# Patient Record
Sex: Male | Born: 1940 | Race: White | Hispanic: No | Smoking: Former smoker
Health system: Southern US, Community
[De-identification: ages and names within clinical notes are randomized; demographics above are authoritative.]

## PROBLEM LIST (undated history)

## (undated) DIAGNOSIS — Z923 Personal history of irradiation: Secondary | ICD-10-CM

## (undated) DIAGNOSIS — C61 Malignant neoplasm of prostate: Secondary | ICD-10-CM

## (undated) DIAGNOSIS — C7951 Secondary malignant neoplasm of bone: Secondary | ICD-10-CM

## (undated) HISTORY — PX: APPENDECTOMY: SHX54

## (undated) HISTORY — PX: COLON SURGERY: SHX602

## (undated) HISTORY — PX: CHOLECYSTECTOMY: SHX55

## (undated) HISTORY — PX: CATARACT EXTRACTION: SUR2

---

## 2012-09-09 ENCOUNTER — Other Ambulatory Visit: Payer: Self-pay | Admitting: Family Medicine

## 2012-09-09 DIAGNOSIS — M79604 Pain in right leg: Secondary | ICD-10-CM

## 2012-09-09 DIAGNOSIS — N200 Calculus of kidney: Secondary | ICD-10-CM

## 2012-09-09 DIAGNOSIS — R109 Unspecified abdominal pain: Secondary | ICD-10-CM

## 2012-09-09 DIAGNOSIS — R319 Hematuria, unspecified: Secondary | ICD-10-CM

## 2012-09-12 ENCOUNTER — Ambulatory Visit
Admission: RE | Admit: 2012-09-12 | Discharge: 2012-09-12 | Disposition: A | Discharge status: Home / Self Care | Payer: Medicare Other | Source: Ambulatory Visit | Attending: Family Medicine | Admitting: Family Medicine

## 2012-09-12 DIAGNOSIS — M79605 Pain in left leg: Secondary | ICD-10-CM

## 2012-09-12 DIAGNOSIS — N200 Calculus of kidney: Secondary | ICD-10-CM

## 2012-09-12 DIAGNOSIS — R109 Unspecified abdominal pain: Secondary | ICD-10-CM

## 2012-09-12 DIAGNOSIS — R319 Hematuria, unspecified: Secondary | ICD-10-CM

## 2012-09-26 ENCOUNTER — Other Ambulatory Visit (HOSPITAL_COMMUNITY): Payer: Self-pay | Admitting: Urology

## 2012-09-26 DIAGNOSIS — C61 Malignant neoplasm of prostate: Secondary | ICD-10-CM

## 2012-10-02 ENCOUNTER — Encounter (HOSPITAL_COMMUNITY)
Admission: RE | Admit: 2012-10-02 | Discharge: 2012-10-02 | Disposition: A | Discharge status: Home / Self Care | Payer: Medicare Other | Source: Ambulatory Visit | Attending: Urology | Admitting: Urology

## 2012-10-02 ENCOUNTER — Encounter (INDEPENDENT_AMBULATORY_CARE_PROVIDER_SITE_OTHER): Payer: Self-pay | Admitting: Surgery

## 2012-10-02 DIAGNOSIS — M948X9 Other specified disorders of cartilage, unspecified sites: Secondary | ICD-10-CM | POA: Insufficient documentation

## 2012-10-02 DIAGNOSIS — C61 Malignant neoplasm of prostate: Secondary | ICD-10-CM | POA: Insufficient documentation

## 2012-10-02 MED ORDER — TECHNETIUM TC 99M MEDRONATE IV KIT
25.3000 | PACK | Freq: Once | INTRAVENOUS | Status: AC | PRN
  Administered 2012-10-02: 25.3 via INTRAVENOUS

## 2012-10-08 ENCOUNTER — Encounter (INDEPENDENT_AMBULATORY_CARE_PROVIDER_SITE_OTHER): Payer: Self-pay | Admitting: Surgery

## 2012-10-08 ENCOUNTER — Ambulatory Visit (INDEPENDENT_AMBULATORY_CARE_PROVIDER_SITE_OTHER): Payer: Medicare Other | Admitting: Surgery

## 2012-10-08 VITALS — BP 127/88 | HR 78 | Temp 97.7°F | Resp 18 | Ht 72.0 in | Wt 280.8 lb

## 2012-10-08 DIAGNOSIS — K8010 Calculus of gallbladder with chronic cholecystitis without obstruction: Secondary | ICD-10-CM

## 2012-10-08 NOTE — Progress Notes (Signed)
Patient ID: Clarence Pollard, male   DOB: 09-Jan-1941, 72 y.o.   MRN: [id number]  Chief Complaint  Patient presents with  . Abdominal Pain    gallstone    HPI Clarence Pollard is a 72 y.o. male.  Referred by Dr. Jeannetta Nap for evaluation of gallbladder disease Urology - Dr. Brunilda Payor  Abdominal Pain Associated symptoms: nausea   Associated symptoms: no chest pain, no chills, no constipation, no cough, no diarrhea, no fever, no hematuria, no sore throat and no vomiting    This is a 72 year old male who unfortunately lost his wife to liver cancer about a month ago, but over the period of the last several weeks he has been experiencing a lot of upper abdominal pain and distention. This seems to get worse when he eats. Occasionally he has some mild nausea.  He also developed some hematuria and has been having a lot of dysuria. He was found to have some nonobstructing  Kidney stones which may explain hematuria. Incidentally he was also noted to have a large gallstone. Unfortunately patient was also found to have elevated PSA and has been diagnosed with prostate cancer. He has an appointment later this afternoon with Dr. Brunilda Payor to discuss plans for treatment. His main complaint is the upper abdominal distention and pain which radiates to his back.  He is accompanied by his stepdaughter today. History reviewed. No pertinent past medical history.  Past Surgical History  Procedure Laterality Date  . Appendectomy    . Colon surgery      History reviewed. No pertinent family history.  Social History History  Substance Use Topics  . Smoking status: Former Games developer  . Smokeless tobacco: Not on file  . Alcohol Use: No    No Known Allergies  Current Outpatient Prescriptions  Medication Sig Dispense Refill  . doxazosin (CARDURA) 8 MG tablet Take 8 mg by mouth at bedtime.      Marland Kitchen HYDROcodone-acetaminophen (NORCO/VICODIN) 5-325 MG per tablet Take 1 tablet by mouth every 6 (six) hours as needed for pain.       . naproxen (NAPROSYN) 500 MG tablet Take 500 mg by mouth 2 (two) times daily with a meal.      . travoprost, benzalkonium, (TRAVATAN) 0.004 % ophthalmic solution 1 drop at bedtime.       No current facility-administered medications for this visit.    Review of Systems Review of Systems  Constitutional: Negative for fever, chills and unexpected weight change.  HENT: Negative for hearing loss, congestion, sore throat, trouble swallowing and voice change.   Eyes: Negative for visual disturbance.  Respiratory: Negative for cough and wheezing.   Cardiovascular: Negative for chest pain, palpitations and leg swelling.  Gastrointestinal: Positive for nausea, abdominal pain and abdominal distention. Negative for vomiting, diarrhea, constipation, blood in stool, anal bleeding and rectal pain.  Genitourinary: Negative for hematuria and difficulty urinating.  Musculoskeletal: Positive for back pain and arthralgias.  Skin: Negative for rash and wound.  Neurological: Negative for seizures, syncope, weakness and headaches.  Hematological: Negative for adenopathy. Does not bruise/bleed easily.  Psychiatric/Behavioral: Negative for confusion.    Blood pressure 127/88, pulse 78, temperature 97.7 F (36.5 C), temperature source Temporal, resp. rate 18, height 6' (1.829 m), weight 280 lb 12.8 oz (127.37 kg).  Physical Exam Physical Exam WDWN in NAD HEENT:  EOMI, sclera anicteric Neck:  No masses, no thyromegaly Lungs:  CTA bilaterally; normal respiratory effort CV:  Regular rate and rhythm; no murmurs Abd:  +bowel sounds, soft,  obese; tender in epigastrium and RUQ; no palpable masses Ext:  Well-perfused; no edema Skin:  Warm, dry; no sign of jaundice  Data Reviewed *RADIOLOGY REPORT*  Clinical Data: Abdominal pain and hematuria.  CT ABDOMEN AND PELVIS WITHOUT CONTRAST  Technique: Multidetector CT imaging of the abdomen and pelvis was  performed following the standard protocol without  intravenous  contrast.  Comparison: None.  Findings: The lung bases are clear except for dependent  atelectasis. The heart is upper limits of normal in size. The  distal esophagus is unremarkable.  No significant hepatic abnormalities. A right hepatic lobe cyst is  noted. No biliary dilatation. The gallbladder is mildly distended  and there is a calcified gallstone noted. No pericholecystic  inflammation. The common bile duct is normal in caliber. The  pancreas is unremarkable. The spleen is normal in size. No focal  lesions. The adrenal glands are normal. There are bilateral renal  calculi but no obstructing ureteral calculi or hydronephrosis. No  bladder calculi.  The stomach, duodenum, small bowel and colon are grossly normal  without oral contrast. No inflammatory changes or mass lesions.  No obstructive findings. No mesenteric or retroperitoneal mass or  adenopathy. Small scattered lymph nodes are noted. The aorta is  normal in caliber. Scattered atherosclerotic calcifications are  noted.  The bladder, prostate gland and seminal vesicles are unremarkable.  No pelvic mass, adenopathy or free pelvic fluid collections. No  inguinal mass or hernia.  The bony structures are intact. No destructive bone lesions or  spinal canal compromise. Both hips are normally located. Mild  degenerative changes.  IMPRESSION:  1. Bilateral renal calculi likely accounting for the patient's  hematuria. No obstructing ureteral calculi or bladder calculi.  2. Cholelithiasis and mild gallbladder distention. No common bile  duct dilatation.  3. No other significant abdominal/pelvic findings.  Original Report Authenticated By: Rudie Meyer, M.D.    Assessment    Symptomatic cholelithiasis with chronic cholecystitis     Plan    Laparoscopic cholecystectomy with intraoperative cholangiogram. The surgical procedure has been discussed with the patient.  Potential risks, benefits, alternative  treatments, and expected outcomes have been explained.  All of the patient's questions at this time have been answered.  The likelihood of reaching the patient's treatment goal is good.  The patient understand the proposed surgical procedure and wishes to proceed. We will await Dr. Madilyn Hook discussion with the patient regarding his prostate cancer.  If no surgery is needed for his prostate, we will schedule his cholecystectomy.            Kraig Genis K. 10/08/2012, 12:25 PM

## 2012-10-09 ENCOUNTER — Telehealth (INDEPENDENT_AMBULATORY_CARE_PROVIDER_SITE_OTHER): Payer: Self-pay | Admitting: Surgery

## 2012-10-09 ENCOUNTER — Other Ambulatory Visit (HOSPITAL_COMMUNITY): Payer: Self-pay | Admitting: Urology

## 2012-10-09 DIAGNOSIS — R937 Abnormal findings on diagnostic imaging of other parts of musculoskeletal system: Secondary | ICD-10-CM

## 2012-10-09 DIAGNOSIS — C61 Malignant neoplasm of prostate: Secondary | ICD-10-CM

## 2012-10-09 NOTE — Telephone Encounter (Signed)
Patient to call back to schedule, holding face sheet in pending

## 2012-10-10 ENCOUNTER — Telehealth (INDEPENDENT_AMBULATORY_CARE_PROVIDER_SITE_OTHER): Payer: Self-pay | Admitting: General Surgery

## 2012-10-10 NOTE — Telephone Encounter (Signed)
Ask Cecilie Kicks to call patient to get him schedule for surgery

## 2012-10-11 ENCOUNTER — Ambulatory Visit
Admission: RE | Admit: 2012-10-11 | Discharge: 2012-10-11 | Disposition: A | Discharge status: Home / Self Care | Payer: Medicare Other | Source: Ambulatory Visit | Attending: Radiation Oncology | Admitting: Radiation Oncology

## 2012-10-11 ENCOUNTER — Other Ambulatory Visit (HOSPITAL_COMMUNITY): Payer: Self-pay | Admitting: Urology

## 2012-10-11 ENCOUNTER — Encounter: Payer: Self-pay | Admitting: Radiation Oncology

## 2012-10-11 ENCOUNTER — Ambulatory Visit (HOSPITAL_COMMUNITY)
Admission: RE | Admit: 2012-10-11 | Discharge: 2012-10-11 | Disposition: A | Discharge status: Home / Self Care | Payer: Medicare Other | Source: Ambulatory Visit | Attending: Urology | Admitting: Urology

## 2012-10-11 DIAGNOSIS — K208 Other esophagitis without bleeding: Secondary | ICD-10-CM | POA: Insufficient documentation

## 2012-10-11 DIAGNOSIS — Z510 Encounter for antineoplastic radiation therapy: Secondary | ICD-10-CM | POA: Insufficient documentation

## 2012-10-11 DIAGNOSIS — C7951 Secondary malignant neoplasm of bone: Secondary | ICD-10-CM | POA: Insufficient documentation

## 2012-10-11 DIAGNOSIS — Z79899 Other long term (current) drug therapy: Secondary | ICD-10-CM | POA: Insufficient documentation

## 2012-10-11 DIAGNOSIS — R937 Abnormal findings on diagnostic imaging of other parts of musculoskeletal system: Secondary | ICD-10-CM

## 2012-10-11 DIAGNOSIS — G992 Myelopathy in diseases classified elsewhere: Secondary | ICD-10-CM | POA: Insufficient documentation

## 2012-10-11 DIAGNOSIS — C61 Malignant neoplasm of prostate: Secondary | ICD-10-CM

## 2012-10-11 DIAGNOSIS — C7952 Secondary malignant neoplasm of bone marrow: Secondary | ICD-10-CM | POA: Insufficient documentation

## 2012-10-11 DIAGNOSIS — Y842 Radiological procedure and radiotherapy as the cause of abnormal reaction of the patient, or of later complication, without mention of misadventure at the time of the procedure: Secondary | ICD-10-CM | POA: Insufficient documentation

## 2012-10-11 MED ORDER — DEXAMETHASONE 4 MG PO TABS: ORAL_TABLET | ORAL | Status: DC

## 2012-10-11 MED ORDER — GADOBENATE DIMEGLUMINE 529 MG/ML IV SOLN
20.0000 mL | Freq: Once | INTRAVENOUS | Status: AC | PRN
  Administered 2012-10-11: 20 mL via INTRAVENOUS

## 2012-10-14 ENCOUNTER — Ambulatory Visit
Admission: RE | Admit: 2012-10-14 | Discharge: 2012-10-14 | Disposition: A | Discharge status: Home / Self Care | Payer: Medicare Other | Source: Ambulatory Visit | Attending: Radiation Oncology | Admitting: Radiation Oncology

## 2012-10-14 DIAGNOSIS — K8010 Calculus of gallbladder with chronic cholecystitis without obstruction: Secondary | ICD-10-CM

## 2012-10-15 ENCOUNTER — Ambulatory Visit
Admission: RE | Admit: 2012-10-15 | Discharge: 2012-10-15 | Disposition: A | Discharge status: Home / Self Care | Payer: Medicare Other | Source: Ambulatory Visit | Attending: Radiation Oncology | Admitting: Radiation Oncology

## 2012-10-15 DIAGNOSIS — C7951 Secondary malignant neoplasm of bone: Secondary | ICD-10-CM | POA: Insufficient documentation

## 2012-10-15 NOTE — Progress Notes (Signed)
Seward Meth here for post sim ed.  He was given the Radiation Therapy and You book and discussed the potential side effects of treatment including fatigue, hair loss, skin changes and throat changes.  He was oriented to the clinic and was advised to contact nursing with any questions or concerns.

## 2012-10-15 NOTE — Progress Notes (Signed)
  Radiation Oncology         [phone] ________________________________  Name: Clarence Pollard MRN: [id number]  Date: 10/14/2012  DOB: [birth year]  Simulation Verification Note   NARRATIVE: The patient was brought to the treatment unit and placed in the planned treatment position. The clinical setup was verified. Then port films were obtained and uploaded to the radiation oncology medical record software.  The treatment beams were carefully compared against the planned radiation fields. The position, location, and shape of the radiation fields was reviewed. The targeted volume of tissue appears to be appropriately covered by the radiation beams. Based on my personal review, I approved the simulation verification. The patient's treatment will proceed as planned.  ________________________________   Radene Gunning, MD, PhD

## 2012-10-15 NOTE — Progress Notes (Signed)
Radiation Oncology         [phone] ________________________________  Name: WOODY KRONBERG MRN: [id number]  Date: 10/11/2012  DOB: [birth year]  CC:No primary provider on file.  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    HISTORY OF PRESENT ILLNESS::Randall Lacretia Nicks Squier is a 72 y.o. male who is seen for an initial consultation visit. The patient has a recent diagnosis of prostate cancer. He has been evaluated by Dr. Brunilda Payor  who called me today about the patient. The workup included a bone scan after the patient was found to have prostate cancer. This showed multifocal abnormal activity indicative of metastatic disease. There was pronounced activity in the lower thoracic spine and a thoracic spine MRI scan was recommended. This was completed on 10/11/2012. This demonstrated osseous metastatic disease with confluence thoracic spine tumor from the T8 through the T10 vertebral body with epidural disease and T9 level cord compression. There is no spinal cord signal abnormality at this level.   Given the finding of cord compression from metastatic disease, I was asked to see the patient as soon as possible.   PREVIOUS RADIATION THERAPY: No   PAST MEDICAL HISTORY:  has no past medical history on file.     PAST SURGICAL HISTORY: Past Surgical History  Procedure Laterality Date  . Appendectomy    . Colon surgery       FAMILY HISTORY: family history is not on file.   SOCIAL HISTORY:  reports that he has quit smoking. He does not have any smokeless tobacco history on file. He reports that he does not drink alcohol or use illicit drugs.   ALLERGIES: Review of patient's allergies indicates no known allergies.   MEDICATIONS:  Current Outpatient Prescriptions  Medication Sig Dispense Refill  . dexamethasone (DECADRON) 4 MG tablet Take with meals: 2 tabs po x1, then 1 tab Q 12 hr x 5 days, then  1 tab po QD x 5 days, then 1/2 tab QD x 5 days.  30 tablet  0  . doxazosin (CARDURA) 8 MG tablet Take 8 mg by mouth at bedtime.      Marland Kitchen HYDROcodone-acetaminophen (NORCO/VICODIN) 5-325 MG per tablet Take 1 tablet by mouth every 6 (six) hours as needed for pain.      . naproxen (NAPROSYN) 500 MG tablet Take 500 mg by mouth 2 (two) times daily with a meal.      . travoprost, benzalkonium, (TRAVATAN) 0.004 % ophthalmic solution 1 drop at bedtime.       No current facility-administered medications for this encounter.     REVIEW OF SYSTEMS:  A 15 point review of systems is documented in the electronic medical record. This was obtained by the nursing staff. However, I reviewed this with the patient to discuss relevant findings and make appropriate changes.  Pertinent items are noted in HPI.  The patient denies any weakness, bowel or bladder dysfunction, no numbness. He does have some pain in the lower chest region posteriorly.   PHYSICAL EXAM:  Alert, in no acute distress. The patient was not accompanied by anybody today. He was able to answer questions appropriately and was processing the new information regarding his diagnosis.    LABORATORY DATA:  No results found for this basename: WBC, HGB, HCT, MCV, PLT   No results found for this basename: NA, K, CL, CO2   No results found for this basename: ALT, AST,  GGT, ALKPHOS, BILITOT      RADIOGRAPHY: Mr Thoracic Spine W Wo Contrast  10/11/2012   **ADDENDUM** CREATED: 10/11/2012 10:07:06  Critical Value/emergent results were called by telephone at the time of interpretation on 10/11/2012 at 0955 hours to Dr. Vernia Buff- Francesco Runner, who verbally acknowledged these results.  **END ADDENDUM** SIGNED BY: Harley Hallmark, M.D.  10/11/2012   *RADIOLOGY REPORT*  Clinical Data: 72 year old male with recent diagnosis of prostate cancer.  Abnormal whole body bone scan, suspicious for osseous metastatic disease.  MRI THORACIC SPINE WITHOUT AND WITH CONTRAST   Technique:  Multiplanar and multiecho pulse sequences of the thoracic spine were obtained without and with intravenous contrast.  Contrast: 20mL MULTIHANCE GADOBENATE DIMEGLUMINE 529 MG/ML IV SOLN  Comparison: Whole body bone scan 10/02/2012. CT abdomen and pelvis 09/12/2012.  Findings: Limited sagittal imaging of the cervical spine suggests abnormally decreased marrow signal at most cervical levels.  The C7 and C2 vertebral bodies might be spared.  Superimposed disc and endplate degeneration in the cervical spine which probably results in a degree of degenerative cervical spinal stenosis.  Mildly decreased T1 marrow signal throughout the spine, with more pronounced abnormal decreased T1 bone marrow signal in the T8 and T9 vertebrae.  Furthermore, there is abnormal posterior element signal at T8 and T9.  Much of the central and posterior T10 vertebral body also demonstrates abnormal signal.  The left posterior elements of T9 and T10 are indistinct and appear expanded such as due to extra osseous extension of tumor.  There is abnormal epidural soft tissue in the left lateral recess at T8, and nearly circumferentially present at T9 and T10.  This is bulky on the left at T9.  There is subsequent spinal stenosis with cord compression at the T9 level (series 10 image 37).  Despite this mass effect on the spinal cord, no definite spinal cord signal abnormality is identified.  No thoracic pathologic compression fracture.  At this time vertebral height and alignment are preserved.  Diffuse endplate osteophytosis throughout the thoracic spine, and the above pathologic changes are superimposed on thoracic epidural lipomatosis which mildly effaces CSF from around the spinal cord at some levels.  There is no significant posterior disc herniation or degenerative spinal stenosis in the thoracic spine.  Abnormal marrow signal also intermittently noted in the bilateral thoracic ribs, maximal at the ninth ribs.  Trace layering  pleural effusions.  Posterior paraspinal soft tissues are within normal limits. Negative visualized thoracic and upper abdominal viscera.  IMPRESSION: 1.  Osseous metastatic disease.  Confluent thoracic spine tumor T8 - T10 with epidural disease and T9 level cord compression.  No spinal cord signal abnormality. 2.  No other thoracic degenerative or pathologic spinal stenosis. Suspect intermittent cervical spine bony metastases with superimposed degenerative cervical spinal stenosis. 3.  Trace pleural effusion  Original Report Authenticated By: Erskine Speed, M.D.   Nm Bone Scan Whole Body  10/02/2012   *RADIOLOGY REPORT*  Clinical Data: New diagnosis of prostate cancer.  The patient denies bone pain and prior injury.  NUCLEAR MEDICINE WHOLE BODY BONE SCINTIGRAPHY  Technique:  Whole body anterior and posterior images were obtained approximately 3 hours after intravenous injection of radiopharmaceutical.  Radiopharmaceutical: 25.3MILLI CURIE TC-MDP TECHNETIUM TC 38M MEDRONATE IV KIT  Comparison: Abdominal pelvic CT 09/12/2012.  Findings: There is intensely increased activity in the lower thoracic spine, especially at T8, T9 and T10.  There is also some activity on the left at T11 and on the right at  T8.  This activity likely involves the costovertebral junctions bilaterally.  Additional scattered activity is present suspicious for metastatic disease; this is most prominent within the inferior aspect of the left scapula, in the left iliac wing, in the left ischium and in the proximal left femoral diaphysis. Mild degenerative activity is present within the shoulders and knees.  The soft tissue activity appears normal.  IMPRESSION:  1.  There is multifocal abnormal activity suspicious for metastatic disease. There are corresponding sclerotic lesions within the left iliac bone and left ischium on the recent CT. 2.  Pronounced activity in the lower thoracic spine could be metastatic disease as well, although is suspicious  for possible osteomyelitis or neuropathic change.  The abdominal CT performed three weeks ago extends superiorly to the mid T9 level and demonstrates a lucent lesion in the posterior left aspect of T9 but no typical sclerotic metastasis. Further evaluation of the thoracic spine with MRI is recommended.  I attempted to call the report today to Dr. Brunilda Payor, although he was not in the office. These results will be called to the ordering clinician or representative tomorrow morning by the Radiologist Assistant, and communication documented in the PACS Dashboard.   Original Report Authenticated By: Carey Bullocks, M.D.       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 He does have spinal cord compression at the T9 level with additional epidural disease at T8 and T10. He is appropriate to proceed with palliative radiotherapy to this area at this time.     PLAN: The patient will proceed with simulation today. I discussed his new diagnosis with him and the rationale for treatment to this area as soon as this can be arranged. I the patient for a course of steroids which we will taper down over the next couple of weeks.    I spent 30 minutes minutes face to face with the patient and more than 50% of that time was spent in counseling and/or coordination of care.    ________________________________   Radene Gunning, MD, PhD

## 2012-10-16 ENCOUNTER — Ambulatory Visit
Admission: RE | Admit: 2012-10-16 | Discharge: 2012-10-16 | Disposition: A | Discharge status: Home / Self Care | Payer: Medicare Other | Source: Ambulatory Visit | Attending: Radiation Oncology | Admitting: Radiation Oncology

## 2012-10-17 ENCOUNTER — Ambulatory Visit
Admission: RE | Admit: 2012-10-17 | Discharge: 2012-10-17 | Disposition: A | Discharge status: Home / Self Care | Payer: Medicare Other | Source: Ambulatory Visit | Attending: Radiation Oncology | Admitting: Radiation Oncology

## 2012-10-18 ENCOUNTER — Ambulatory Visit
Admission: RE | Admit: 2012-10-18 | Discharge: 2012-10-18 | Disposition: A | Discharge status: Home / Self Care | Payer: Medicare Other | Source: Ambulatory Visit | Attending: Radiation Oncology | Admitting: Radiation Oncology

## 2012-10-18 VITALS — BP 137/65 | HR 83 | Temp 98.0°F | Ht 72.0 in | Wt 280.5 lb

## 2012-10-18 DIAGNOSIS — C7951 Secondary malignant neoplasm of bone: Secondary | ICD-10-CM

## 2012-10-18 NOTE — Progress Notes (Signed)
Clarence Pollard here for weekly under treat visit.  He has had 5 fractions to his t7-11 spine. He does have pain in his upper back that he is rating at a 6/10.  He is taking hot showers and naproxen which helps.He denies fatigue.  He states that his skin is intact.  He does have dizziness occasionally when he stands up.  He has brought a jury duty request today and is wondering if a letter can be written to excuse him from it.

## 2012-10-18 NOTE — Progress Notes (Signed)
   Department of Radiation Oncology  Phone:  [phone] Fax:        [phone]  Weekly Treatment Note    Name: Clarence Pollard Date: 10/18/2012 MRN: [id number] DOB: 1940-06-21   Current dose: 15 Gy  Current fraction: 5   MEDICATIONS: Current Outpatient Prescriptions  Medication Sig Dispense Refill  . dexamethasone (DECADRON) 4 MG tablet Take with meals: 2 tabs po x1, then 1 tab Q 12 hr x 5 days, then 1 tab po QD x 5 days, then 1/2 tab QD x 5 days.  30 tablet  0  . doxazosin (CARDURA) 8 MG tablet Take 8 mg by mouth at bedtime.      . naproxen (NAPROSYN) 500 MG tablet Take 500 mg by mouth 2 (two) times daily with a meal.      . travoprost, benzalkonium, (TRAVATAN) 0.004 % ophthalmic solution 1 drop at bedtime.      Marland Kitchen HYDROcodone-acetaminophen (NORCO/VICODIN) 5-325 MG per tablet Take 1 tablet by mouth every 6 (six) hours as needed for pain.       No current facility-administered medications for this encounter.     ALLERGIES: Review of patient's allergies indicates no known allergies.   LABORATORY DATA:  No results found for this basename: WBC, HGB, HCT, MCV, PLT   No results found for this basename: NA, K, CL, CO2   No results found for this basename: ALT, AST, GGT, ALKPHOS, BILITOT     NARRATIVE: Clarence Pollard was seen today for weekly treatment management. The chart was checked and the patient's films were reviewed. The patient is doing very well with treatment. He does have some upper back pain which he rates as a 6/10. The patient had some questions about his prognosis and his overall treatment which we discussed. The plan over the bone scan results and the patient was pleased to see the pictures of it.  PHYSICAL EXAMINATION: height is 6' (1.829 m) and weight is 280 lb 8 oz (127.234 kg). His temperature is 98 F (36.7 C). His blood pressure is 137/65 and his pulse is 83. His oxygen saturation is 100%.        ASSESSMENT: The patient is doing satisfactorily with  treatment.  PLAN: We will continue with the patient's radiation treatment as planned.

## 2012-10-21 ENCOUNTER — Ambulatory Visit
Admission: RE | Admit: 2012-10-21 | Discharge: 2012-10-21 | Disposition: A | Discharge status: Home / Self Care | Payer: Medicare Other | Source: Ambulatory Visit | Attending: Radiation Oncology | Admitting: Radiation Oncology

## 2012-10-22 ENCOUNTER — Ambulatory Visit
Admission: RE | Admit: 2012-10-22 | Discharge: 2012-10-22 | Disposition: A | Discharge status: Home / Self Care | Payer: Medicare Other | Source: Ambulatory Visit | Attending: Radiation Oncology | Admitting: Radiation Oncology

## 2012-10-23 ENCOUNTER — Ambulatory Visit
Admission: RE | Admit: 2012-10-23 | Discharge: 2012-10-23 | Disposition: A | Discharge status: Home / Self Care | Payer: Medicare Other | Source: Ambulatory Visit | Attending: Radiation Oncology | Admitting: Radiation Oncology

## 2012-10-23 VITALS — BP 117/65 | HR 93 | Temp 98.1°F | Ht 72.0 in | Wt 276.5 lb

## 2012-10-23 DIAGNOSIS — C7951 Secondary malignant neoplasm of bone: Secondary | ICD-10-CM

## 2012-10-23 MED ORDER — SUCRALFATE 1 G PO TABS: 1.0000 g | ORAL_TABLET | Freq: Four times a day (QID) | ORAL | Status: DC

## 2012-10-23 NOTE — Progress Notes (Signed)
Clarence Pollard here for weekly under treat visit.  He has had 8 fractions to T7-T11.  He is having pain with swallowing that he is rating at a 7/10.  He also reports a narrowing in this troat with swallowing.   He feels like the food could get stuck.  He reports nausea.  He has lost 4 lbs since 10/18/2012.  He does have fatigue.  He states that he is going to have gallbladder surgery on Monday.  He also brought a form for jury duty.  Will work on getting him a Physicist, medical to excuse him.Marland Kitchen

## 2012-10-23 NOTE — Progress Notes (Signed)
   Department of Radiation Oncology  Phone:  [phone] Fax:        [phone]  Weekly Treatment Note    Name: Clarence Pollard Date: 10/23/2012 MRN: [id number] DOB: 1940-04-01   Current dose: 24 Gy  Current fraction: 8   MEDICATIONS: Current Outpatient Prescriptions  Medication Sig Dispense Refill  . Calcium Carbonate-Vitamin D (CALCIUM-VITAMIN D) 500-200 MG-UNIT per tablet Take 1 tablet by mouth 2 (two) times daily with a meal.      . dexamethasone (DECADRON) 4 MG tablet Take with meals: 2 tabs po x1, then 1 tab Q 12 hr x 5 days, then 1 tab po QD x 5 days, then 1/2 tab QD x 5 days.  30 tablet  0  . doxazosin (CARDURA) 8 MG tablet Take 8 mg by mouth at bedtime.      . travoprost, benzalkonium, (TRAVATAN) 0.004 % ophthalmic solution 1 drop at bedtime.      Marland Kitchen HYDROcodone-acetaminophen (NORCO/VICODIN) 5-325 MG per tablet Take 1 tablet by mouth every 6 (six) hours as needed for pain.      . naproxen (NAPROSYN) 500 MG tablet Take 500 mg by mouth 2 (two) times daily with a meal.      . sucralfate (CARAFATE) 1 G tablet Take 1 tablet (1 g total) by mouth 4 (four) times daily.  120 tablet  2   No current facility-administered medications for this encounter.     ALLERGIES: Review of patient's allergies indicates no known allergies.   LABORATORY DATA:  No results found for this basename: WBC, HGB, HCT, MCV, PLT   No results found for this basename: NA, K, CL, CO2   No results found for this basename: ALT, AST, GGT, ALKPHOS, BILITOT     NARRATIVE: Clarence Pollard was seen today for weekly treatment management. The chart was checked and the patient's films were reviewed. The patient is as well overall with treatment. He is having some esophagitis and really has not been taking anything for this as of yet.  PHYSICAL EXAMINATION: height is 6' (1.829 m) and weight is 276 lb 8 oz (125.42 kg). His temperature is 98.1 F (36.7 C). His blood pressure is 117/65 and his pulse is 93.         ASSESSMENT: The patient is doing satisfactorily with treatment.  PLAN: We will continue with the patient's radiation treatment as planned. The patient will begin using Prilosec and I have written him a prescription for Carafate for his esophagitis. The patient is to let us know if this is not adequate. Acuity seen for followup in one month.

## 2012-10-24 ENCOUNTER — Telehealth: Payer: Self-pay | Admitting: *Deleted

## 2012-10-24 ENCOUNTER — Encounter: Payer: Self-pay | Admitting: Radiation Oncology

## 2012-10-24 ENCOUNTER — Ambulatory Visit
Admission: RE | Admit: 2012-10-24 | Discharge: 2012-10-24 | Disposition: A | Discharge status: Home / Self Care | Payer: Medicare Other | Source: Ambulatory Visit | Attending: Radiation Oncology | Admitting: Radiation Oncology

## 2012-10-24 NOTE — Telephone Encounter (Signed)
On 10-24-12 mail letter for jury duty.

## 2012-10-25 ENCOUNTER — Encounter: Payer: Self-pay | Admitting: Radiation Oncology

## 2012-10-25 ENCOUNTER — Ambulatory Visit
Admission: RE | Admit: 2012-10-25 | Discharge: 2012-10-25 | Disposition: A | Discharge status: Home / Self Care | Payer: Medicare Other | Source: Ambulatory Visit | Attending: Radiation Oncology | Admitting: Radiation Oncology

## 2012-10-25 NOTE — Addendum Note (Signed)
Encounter addended by: Bernell List on: 10/25/2012 12:15 PM<BR>     Documentation filed: Charges VN

## 2012-10-28 ENCOUNTER — Other Ambulatory Visit (INDEPENDENT_AMBULATORY_CARE_PROVIDER_SITE_OTHER): Payer: Self-pay | Admitting: Surgery

## 2012-10-28 DIAGNOSIS — K8010 Calculus of gallbladder with chronic cholecystitis without obstruction: Secondary | ICD-10-CM

## 2012-10-29 ENCOUNTER — Other Ambulatory Visit (INDEPENDENT_AMBULATORY_CARE_PROVIDER_SITE_OTHER): Payer: Self-pay | Admitting: *Deleted

## 2012-10-29 MED ORDER — OXYCODONE-ACETAMINOPHEN 5-325 MG PO TABS: 1.0000 | ORAL_TABLET | ORAL | Status: DC | PRN

## 2012-10-30 NOTE — Progress Notes (Signed)
  Radiation Oncology         [phone] ________________________________  Name: Clarence Pollard MRN: [id number]  Date: 10/11/2012  DOB: Dec 30, 1940  SIMULATION AND TREATMENT PLANNING NOTE  DIAGNOSIS:  Metastatic prostate cancer  NARRATIVE:  The patient was brought to the CT Simulation planning suite.  Identity was confirmed.  All relevant records and images related to the planned course of therapy were reviewed.   Written consent to proceed with treatment was confirmed which was freely given after reviewing the details related to the planned course of therapy had been reviewed with the patient.  Then, the patient was set-up in a stable reproducible  supine position for radiation therapy.  CT images were obtained.  Surface markings were placed.    The CT images were loaded into the planning software.  Then the target and avoidance structures were contoured.  Treatment planning then occurred.  The radiation prescription was entered and confirmed.  A total of 4 Complex treatment devices were fabricated which relate to the designed radiation treatment fields. Each of these customized fields/ complex treatment devices will be used on a daily basis during the radiation course. I have requested : 3-D conformal technique. The target volume has been contoured in addition to normal structures including the esophagus, heart, and spinal cord. The dose volume histograms of each of these structures will be carefully reviewed.  PLAN:  The patient will receive 30 Gy in 10 fractions.  ________________________________   Radene Gunning, MD, PhD

## 2012-10-30 NOTE — Addendum Note (Signed)
Encounter addended by: Jonna Coup, MD on: 10/30/2012  8:57 AM<BR>     Documentation filed: Notes Section

## 2012-10-30 NOTE — Progress Notes (Signed)
  Radiation Oncology         [phone] ________________________________  Name: Clarence Pollard MRN: [id number]  Date: 10/25/2012  DOB: 1940/07/14  End of Treatment Note  Diagnosis:   Metastatic prostate cancer     Indication for treatment:  Palliative       Radiation treatment dates:   10/14/2012 through 10/25/2012  Site/dose:   The patient was treated to the thoracic spine at approximately the T7 through the T 11 vertebral bodies. He was treated using a 4 field technique.  Narrative: The patient tolerated radiation treatment relatively well.   The patient really did not have any significant pain prior to treatment. He did not have significant difficulties in terms of acute toxicity. We discussed the possibility of esophagitis.  Plan: The patient has completed radiation treatment. The patient will return to radiation oncology clinic for routine followup in one month. I advised the patient to call or return sooner if they have any questions or concerns related to their recovery or treatment. ________________________________  Radene Gunning, M.D., Ph.D.

## 2012-11-07 ENCOUNTER — Telehealth: Payer: Self-pay | Admitting: Dietician

## 2012-11-07 NOTE — Telephone Encounter (Signed)
Brief Outpatient Oncology Nutrition Note  Patient has been identified to be at risk on malnutrition screen.  Wt Readings from Last 10 Encounters:  10/23/12 276 lb 8 oz (125.42 kg)  10/18/12 280 lb 8 oz (127.234 kg)  10/08/12 280 lb 12.8 oz (127.37 kg)    Patient with metastatic prostate cancer to lower thoracic spine.  Called patient due to weight loss.  Patient states that his appetite is down, complaints of taste alterations and diarrhea,.  Drinks Ensure at times.  States he would like to lose weight.  Discussed the importance of nutrition to maintain strength and nutritional status.  Will mail patient the following nutrition tip sheets.  "Soft and Moist High Protein Menu Ideas", "Diarrhea", and "Poor Appetite" along with [facility]'s contact information and Ensure coupons.  Oran Rein, RD, LDN

## 2012-11-11 ENCOUNTER — Encounter (INDEPENDENT_AMBULATORY_CARE_PROVIDER_SITE_OTHER): Payer: Medicare Other | Admitting: Surgery

## 2012-11-13 ENCOUNTER — Encounter (INDEPENDENT_AMBULATORY_CARE_PROVIDER_SITE_OTHER): Payer: Self-pay | Admitting: Surgery

## 2012-11-13 ENCOUNTER — Ambulatory Visit (INDEPENDENT_AMBULATORY_CARE_PROVIDER_SITE_OTHER): Payer: Medicare Other | Admitting: Surgery

## 2012-11-13 VITALS — BP 102/54 | HR 100 | Resp 18 | Ht 72.0 in | Wt 264.6 lb

## 2012-11-13 DIAGNOSIS — K8010 Calculus of gallbladder with chronic cholecystitis without obstruction: Secondary | ICD-10-CM

## 2012-11-13 NOTE — Progress Notes (Signed)
S/p laparoscopic cholecystectomy with intraoperative cholangiogram on 10/28/12 for chronic calculus cholecystitis.  The patient is doing quite well.  He has had some diarrhea after eating [place] food, but otherwise is doing OK.  Incisions are well-healed with no sign of infection.  No sign of recurrent umbilical hernia where we sutured it at the time of surgery.  No abdominal tenderness.  He may resume full activity.  Fiber supplement as needed for post-prandial diarrhea.  Follow-up PRN.  Wilmon Arms. Corliss Skains, MD, [facility]  General/ Trauma Surgery  11/13/2012 4:04 PM

## 2012-11-19 ENCOUNTER — Telehealth: Payer: Self-pay | Admitting: Internal Medicine

## 2012-11-19 ENCOUNTER — Telehealth: Payer: Self-pay | Admitting: Oncology

## 2012-11-19 NOTE — Telephone Encounter (Signed)
LVOM FOR PT TO RETURN CALL IN RE TO REFERRAL.  °

## 2012-11-19 NOTE — Telephone Encounter (Signed)
C/D 11/19/12 for appt. 11/29/12

## 2012-11-22 ENCOUNTER — Other Ambulatory Visit: Payer: Self-pay | Admitting: Oncology

## 2012-11-22 DIAGNOSIS — C7951 Secondary malignant neoplasm of bone: Secondary | ICD-10-CM

## 2012-11-25 ENCOUNTER — Encounter (INDEPENDENT_AMBULATORY_CARE_PROVIDER_SITE_OTHER): Payer: Medicare Other | Admitting: Surgery

## 2012-11-27 ENCOUNTER — Encounter: Payer: Self-pay | Admitting: Radiation Oncology

## 2012-11-28 ENCOUNTER — Encounter: Payer: Self-pay | Admitting: Radiation Oncology

## 2012-11-28 ENCOUNTER — Ambulatory Visit
Admission: RE | Admit: 2012-11-28 | Discharge: 2012-11-28 | Disposition: A | Discharge status: Home / Self Care | Payer: Medicare Other | Source: Ambulatory Visit | Attending: Radiation Oncology | Admitting: Radiation Oncology

## 2012-11-28 VITALS — BP 121/61 | HR 102 | Temp 97.8°F | Resp 22

## 2012-11-28 DIAGNOSIS — C7951 Secondary malignant neoplasm of bone: Secondary | ICD-10-CM

## 2012-11-28 HISTORY — DX: Malignant neoplasm of prostate: C61

## 2012-11-28 HISTORY — DX: Personal history of irradiation: Z92.3

## 2012-11-28 HISTORY — DX: Secondary malignant neoplasm of bone: C79.51

## 2012-11-28 NOTE — Progress Notes (Addendum)
Follow up s/p rad tx  T7-T11, 10/14/12-10/25/12, pain better, stated, appetite not too good, eats 1 meal a day, drink boost occasionally,reg bowels, no nausea,  lupron injection 11/14/12, has hot flashes, night sweats sometimes, energy level gettuing better stated, steady gait, gallbladder taken out 10/28/12 dr. Corliss Skains and umbilical hernia repair 11:35 AM

## 2012-11-29 ENCOUNTER — Ambulatory Visit (HOSPITAL_BASED_OUTPATIENT_CLINIC_OR_DEPARTMENT_OTHER): Payer: Medicare Other | Admitting: Oncology

## 2012-11-29 ENCOUNTER — Ambulatory Visit: Payer: Medicare Other

## 2012-11-29 ENCOUNTER — Telehealth: Payer: Self-pay | Admitting: Oncology

## 2012-11-29 ENCOUNTER — Other Ambulatory Visit (HOSPITAL_BASED_OUTPATIENT_CLINIC_OR_DEPARTMENT_OTHER): Payer: Medicare Other

## 2012-11-29 VITALS — BP 129/74 | HR 83 | Temp 98.2°F | Resp 18 | Ht 72.0 in | Wt 263.6 lb

## 2012-11-29 DIAGNOSIS — C7951 Secondary malignant neoplasm of bone: Secondary | ICD-10-CM

## 2012-11-29 DIAGNOSIS — C801 Malignant (primary) neoplasm, unspecified: Secondary | ICD-10-CM

## 2012-11-29 LAB — CBC WITH DIFFERENTIAL/PLATELET
Basophils Absolute: 0.1 10*3/uL (ref 0.0–0.1)
EOS%: 7.4 % — ABNORMAL HIGH (ref 0.0–7.0)
HCT: 30.3 % — ABNORMAL LOW (ref 38.4–49.9)
HGB: 9.3 g/dL — ABNORMAL LOW (ref 13.0–17.1)
MCH: 21.7 pg — ABNORMAL LOW (ref 27.2–33.4)
MCV: 70.4 fL — ABNORMAL LOW (ref 79.3–98.0)
MONO%: 12.3 % (ref 0.0–14.0)
NEUT%: 58.9 % (ref 39.0–75.0)
Platelets: 279 10*3/uL (ref 140–400)

## 2012-11-29 LAB — TESTOSTERONE: Testosterone: 34 ng/dL — ABNORMAL LOW (ref 300–890)

## 2012-11-29 LAB — COMPREHENSIVE METABOLIC PANEL (CC13)
AST: 17 U/L (ref 5–34)
Alkaline Phosphatase: 99 U/L (ref 40–150)
BUN: 13.4 mg/dL (ref 7.0–26.0)
Calcium: 8.9 mg/dL (ref 8.4–10.4)
Creatinine: 0.9 mg/dL (ref 0.7–1.3)

## 2012-11-29 NOTE — Telephone Encounter (Signed)
Gave pt appt for lab and Md for Md vsiit on March 2015

## 2012-11-29 NOTE — Progress Notes (Signed)
  Radiation Oncology         [phone] ________________________________  Name: Clarence Pollard MRN: [id number]  Date: 11/28/2012  DOB: 1940/06/17  Follow-Up Visit Note  CC: Kaleen Mask, MD  Lindaann Slough, MD  Diagnosis:   Metastatic prostate cancer  Interval Since Last Radiation:   One month   Narrative:  The patient returns today for routine follow-up.  The patient indicates that he is feeling better overall. His overall strength has been improving and he states that he is walking better. Decreased back pain. He indicates that his esophagitis is improved. No other major areas of pain. The patient recently had surgery with regards to his gallbladder and a hernia.                              ALLERGIES:  has No Known Allergies.  Meds: Current Outpatient Prescriptions  Medication Sig Dispense Refill  . Calcium Carbonate-Vitamin D (CALCIUM-VITAMIN D) 500-200 MG-UNIT per tablet Take 1 tablet by mouth 2 (two) times daily with a meal.      . travoprost, benzalkonium, (TRAVATAN) 0.004 % ophthalmic solution 1 drop at bedtime.       No current facility-administered medications for this encounter.    Physical Findings: The patient is in no acute distress. Patient is alert and oriented.  oral temperature is 97.8 F (36.6 C). His blood pressure is 121/61 and his pulse is 102. His respiration is 22 and oxygen saturation is 95%. .   General: Well-developed, in no acute distress HEENT: Normocephalic, atraumatic Cardiovascular: Regular rate and rhythm Respiratory: Clear to auscultation bilaterally GI: Soft, nontender, normal bowel sounds Extremities: No edema present   Lab Findings: No results found for this basename: WBC, HGB, HCT, MCV, PLT     Radiographic Findings: No results found.  Impression:    The patient is doing satisfactorily one month after a course of palliative radiotherapy to the thoracic spine. He is continuing on Lupron through Dr. Brunilda Payor and he is also  seeing Dr. Clelia Croft in medical oncology tomorrow.  Plan:  Followup in 6 months.   Radene Gunning, M.D., Ph.D.

## 2012-11-29 NOTE — Progress Notes (Signed)
Reason for Referral: Prostate cancer.  HPI: 72 year old gentleman without really any significant past medical history and recently diagnosed with prostate cancer. He was in his normal state of health and on a routine physical he was found to have an elevated PSA initially was 21 and went up to 34.6 in July of 2014. He underwent prostate needle biopsy which showed a Gleason score 5+4 equals 9 and the predominant pattern with very little 4+5 equals 9 pattern. All 12 cores were involved with cancer. Staging workup at that time including a bone scan which showed multifocal abnormal activity suspicious for bony metastasis especially in the T8-T9 and T10 area there are also some suspicious activity in the left scapula left iliac wing left fascia him and proximal left femoral diathesis. He subsequently underwent an MRI of the spine which showed thoracic spine tumor at T8 and T10 with epidural disease at T9. The T9 level was very close to cord compression without any signal abnormalities. Patient subsequently evaluated by Dr. Mitzi Hansen and underwent radiation therapy to the T7-T11 area. Therapy concluded on 10/25/2012 after of started on 10/14/2012. He tolerated the radiation therapy well and noticed significant improvement in his back pain. In the meantime he was started on androgen depravation including a Lupron most recently given in August of 2014. His last PSA dropped down to 1.6 indicating an excellent response to radiation therapy. Patient referred to me for evaluation for concomitant systemic chemotherapy administration.  Clinically, patient feels very well at this time and asymptomatic. He does not report any fevers or chills or appetite changes. He does endorse minor weight loss. He is not reporting any chest pain or shortness of breath or difficulty breathing. Does not report any nausea or vomiting or abdominal pain. Does not report any back pain bone pain or any orthopedic pain. Does not report any bleeding or  clotting tendencies. He still performing activities of daily living without any hindrance or decline.   Past Medical History  Diagnosis Date  . History of radiation therapy 10/14/12-10/25/12    T7-T11   . Prostate cancer   . Metastatic cancer to spine   :  Past Surgical History  Procedure Laterality Date  . Appendectomy    . Colon surgery    . Cholecystectomy    :  Current Outpatient Prescriptions  Medication Sig Dispense Refill  . Calcium Carbonate-Vitamin D (CALCIUM-VITAMIN D) 500-200 MG-UNIT per tablet Take 1 tablet by mouth 2 (two) times daily with a meal.      . travoprost, benzalkonium, (TRAVATAN) 0.004 % ophthalmic solution 1 drop at bedtime.       No current facility-administered medications for this visit.     No Known Allergies:  No family history on file.:  History   Social History  . Marital Status: Widowed    Spouse Name: N/A    Number of Children: N/A  . Years of Education: N/A   Occupational History  . Not on file.   Social History Main Topics  . Smoking status: Former Games developer  . Smokeless tobacco: Not on file  . Alcohol Use: No  . Drug Use: No  . Sexual Activity: Not on file   Other Topics Concern  . Not on file   Social History Narrative  . No narrative on file  :  A comprehensive review of systems was negative.  Exam: ECOG 1 Blood pressure 129/74, pulse 83, temperature 98.2 F (36.8 C), temperature source Oral, resp. rate 18, height 6' (1.829 m), weight 263  lb 9.6 oz (119.568 kg), SpO2 98.00%. General appearance: alert, cooperative and appears stated age Head: Normocephalic, without obvious abnormality, atraumatic Eyes: conjunctivae/corneas clear. PERRL, EOM's intact. Fundi benign. Throat: lips, mucosa, and tongue normal; teeth and gums normal Neck: no adenopathy, no carotid bruit, no JVD, supple, symmetrical, trachea midline and thyroid not enlarged, symmetric, no tenderness/mass/nodules Back: negative, symmetric, no curvature. ROM  normal. No CVA tenderness. Resp: clear to auscultation bilaterally Chest wall: no tenderness Cardio: regular rate and rhythm, S1, S2 normal, no murmur, click, rub or gallop GI: soft, non-tender; bowel sounds normal; no masses,  no organomegaly Extremities: extremities normal, atraumatic, no cyanosis or edema Pulses: 2+ and symmetric Skin: Skin color, texture, turgor normal. No rashes or lesions Lymph nodes: Cervical, supraclavicular, and axillary nodes normal. Neurologic: Grossly normal   Recent Labs  11/29/12 1320  WBC 8.7  HGB 9.3*  HCT 30.3*  PLT 279    Assessment and Plan:   72 year old gentleman with the following issues:  1. Advanced prostate cancer presented with an elevated PSA of 34, Gleason score 5+4 equals 9. He has metastatic bony disease is evident by a bone scan as well as an MRI. He received radiation therapy to the thoracic spine between T7 and T. 11 for impending cord compression. The natural course of advanced prostate cancer that is hormone sensitive was discussed today in detail. Treatment modalities were discussed including androgen deprivation with and without anti-androgen therapy, palliative radiation therapy which he received as well as the role of systemic chemotherapy. I discussed with him the new evidence to suggest use of 4-6 cycles of Taxotere chemotherapy in the hormone sensitive advanced prostate cancer have improved survival of close to 13 months in people of high volume disease. I explained to him that the exact designation of high volume disease is yet to be determined but after discussing the risks and benefits of systemic chemotherapy in the fact that he has only go metastatic disease rather than wide spread metastasis I think you will benefit from systemic chemotherapy but that benefit probably will be incremental and possibly marginal and he is not necessarily willing to proceed with it at this time. I explained to him that he is at risk of developing  castration resistant disease and systemic chemotherapy probably be in his future regardless. His preference is to hold off for now and we will continue to observe him and institute chemotherapy once he develops castration resistant.  2. Androgen deprivation. My recommendation is to continue this under the care of Dr. Brunilda Payor.  3. Bone directed therapy I recommend that he continues or starts Xgeva if not already done so.  4. Followup: I've asked him to come back and see me in 4-6 months we will certainly see him sooner if felt necessary.

## 2012-12-02 ENCOUNTER — Telehealth: Payer: Self-pay | Admitting: Dietician

## 2012-12-02 NOTE — Telephone Encounter (Signed)
Brief Outpatient Oncology Nutrition Note  Patient has been identified to be at risk on malnutrition screen.  Wt Readings from Last 10 Encounters:  11/29/12 263 lb 9.6 oz (119.568 kg)  11/13/12 264 lb 9.6 oz (120.022 kg)  10/23/12 276 lb 8 oz (125.42 kg)  10/18/12 280 lb 8 oz (127.234 kg)  10/08/12 280 lb 12.8 oz (127.37 kg)    Called patient last month.  Patient with continued weight loss.  Metastatic prostate cancer to bone.  Taste alterations and decreased appetite last month and desire to lose weight.    Called patient who is currently no available.  Outpatient RD contact information provided with RD name and number.    Oran Rein, RD, LDN

## 2013-05-29 ENCOUNTER — Encounter: Payer: Self-pay | Admitting: Radiation Oncology

## 2013-05-29 ENCOUNTER — Ambulatory Visit
Admission: RE | Admit: 2013-05-29 | Discharge: 2013-05-29 | Disposition: A | Discharge status: Home / Self Care | Payer: Medicare Other | Source: Ambulatory Visit | Attending: Radiation Oncology | Admitting: Radiation Oncology

## 2013-05-29 VITALS — BP 116/72 | HR 85 | Temp 98.2°F | Resp 20 | Wt 262.8 lb

## 2013-05-29 DIAGNOSIS — C7951 Secondary malignant neoplasm of bone: Secondary | ICD-10-CM

## 2013-05-29 NOTE — Progress Notes (Signed)
  Radiation Oncology         [phone] ________________________________  Name: Clarence Pollard MRN: [id number]  Date: 05/29/2013  DOB: 1940-04-07  Follow-Up Visit Note  CC: [name], MD  Lowella Bandy, MD  Diagnosis:   Metastatic prostate cancer  Interval Since Last Radiation:  7 months   Narrative:  The patient returns today for routine follow-up.  The patient states he has done fairly well. He continues on androgen deprivation and is seen by both medical oncology and urology. No ongoing issues in terms of esophagitis or difficulties with treatment. He does complain of some occasional back pain but this is well relieved with Aleve. The patient does note increased frequency of urination.                              ALLERGIES:  has No Known Allergies.  Meds: Current Outpatient Prescriptions  Medication Sig Dispense Refill  . brimonidine (ALPHAGAN P) 0.1 % SOLN Apply 1 drop to eye 2 (two) times daily. Both eyes      . Calcium Carbonate-Vitamin D (CALCIUM-VITAMIN D) 500-200 MG-UNIT per tablet Take 1 tablet by mouth 2 (two) times daily with a meal.      . ergocalciferol (VITAMIN D2) 50000 UNITS capsule Take 50,000 Units by mouth every other day.      . Magnesium 500 MG CAPS Take 500 mg by mouth daily.      . travoprost, benzalkonium, (TRAVATAN) 0.004 % ophthalmic solution 1 drop at bedtime.       No current facility-administered medications for this encounter.    Physical Findings: The patient is in no acute distress. Patient is alert and oriented.  weight is 262 lb 12.8 oz (119.205 kg). His oral temperature is 98.2 F (36.8 C). His blood pressure is 116/72 and his pulse is 85. His respiration is 20. Marland Kitchen   General: Well-developed, in no acute distress HEENT: Normocephalic, atraumatic Cardiovascular: Regular rate and rhythm Respiratory: Clear to auscultation bilaterally GI: Soft, nontender, normal bowel sounds Extremities: No edema present   Lab Findings: Lab Results    Component Value Date   WBC 8.7 11/29/2012   HGB 9.3* 11/29/2012   HCT 30.3* 11/29/2012   MCV 70.4* 11/29/2012   PLT 279 11/29/2012     Radiographic Findings: No results found.  Impression/plan:    The patient is doing well at this time. No major new complaints. No painful bony disease to any significant degree currently. He does take Aleve on occasion when necessary. I discussed possible followup options with him. Her for her to return to our clinic on a when necessary basis which I believe is fine given his followup schedule currently.  I spent 10 minutes with the patient today, the majority of which was spent counseling the patient on the diagnosis of cancer and coordinating care.   Jodelle Gross, M.D., Ph.D.

## 2013-05-29 NOTE — Progress Notes (Signed)
Follow up  metastatic prostate rad txs:  T-7-T11, 10/14/12-10/25/12, very slow stream  And not much comes out stated,patient and voids every 2 hours,  No dysuria, regular bowel movements, appetite good  says his bladder starts to hurt about every 2 hour  3:54 PM

## 2013-06-04 ENCOUNTER — Other Ambulatory Visit (HOSPITAL_BASED_OUTPATIENT_CLINIC_OR_DEPARTMENT_OTHER): Payer: Medicare Other

## 2013-06-04 DIAGNOSIS — C7951 Secondary malignant neoplasm of bone: Secondary | ICD-10-CM

## 2013-06-04 DIAGNOSIS — C7952 Secondary malignant neoplasm of bone marrow: Secondary | ICD-10-CM

## 2013-06-04 LAB — COMPREHENSIVE METABOLIC PANEL (CC13)
ALK PHOS: 108 U/L (ref 40–150)
ALT: 12 U/L (ref 0–55)
AST: 16 U/L (ref 5–34)
Albumin: 3.4 g/dL — ABNORMAL LOW (ref 3.5–5.0)
Anion Gap: 9 mEq/L (ref 3–11)
BUN: 17.1 mg/dL (ref 7.0–26.0)
CO2: 24 mEq/L (ref 22–29)
CREATININE: 0.9 mg/dL (ref 0.7–1.3)
Calcium: 8.9 mg/dL (ref 8.4–10.4)
Chloride: 107 mEq/L (ref 98–109)
Glucose: 135 mg/dl (ref 70–140)
POTASSIUM: 4.6 meq/L (ref 3.5–5.1)
Sodium: 140 mEq/L (ref 136–145)
Total Bilirubin: 0.38 mg/dL (ref 0.20–1.20)
Total Protein: 7.8 g/dL (ref 6.4–8.3)

## 2013-06-04 LAB — CBC WITH DIFFERENTIAL/PLATELET
BASO%: 0.9 % (ref 0.0–2.0)
BASOS ABS: 0.1 10*3/uL (ref 0.0–0.1)
EOS%: 3.9 % (ref 0.0–7.0)
Eosinophils Absolute: 0.3 10*3/uL (ref 0.0–0.5)
HCT: 33.7 % — ABNORMAL LOW (ref 38.4–49.9)
HEMOGLOBIN: 10.5 g/dL — AB (ref 13.0–17.1)
LYMPH%: 20.9 % (ref 14.0–49.0)
MCH: 24.7 pg — AB (ref 27.2–33.4)
MCHC: 31.3 g/dL — AB (ref 32.0–36.0)
MCV: 78.8 fL — AB (ref 79.3–98.0)
MONO#: 0.9 10*3/uL (ref 0.1–0.9)
MONO%: 10.6 % (ref 0.0–14.0)
NEUT#: 5.2 10*3/uL (ref 1.5–6.5)
NEUT%: 63.7 % (ref 39.0–75.0)
Platelets: 280 10*3/uL (ref 140–400)
RBC: 4.28 10*6/uL (ref 4.20–5.82)
RDW: 18.2 % — AB (ref 11.0–14.6)
WBC: 8.2 10*3/uL (ref 4.0–10.3)
lymph#: 1.7 10*3/uL (ref 0.9–3.3)

## 2013-06-05 LAB — TESTOSTERONE: TESTOSTERONE: 14 ng/dL — AB (ref 300–890)

## 2013-06-05 LAB — PSA: PSA: 5.47 ng/mL — ABNORMAL HIGH (ref <=4.00)

## 2013-06-06 ENCOUNTER — Telehealth: Payer: Self-pay | Admitting: Oncology

## 2013-06-06 ENCOUNTER — Ambulatory Visit (HOSPITAL_BASED_OUTPATIENT_CLINIC_OR_DEPARTMENT_OTHER): Payer: Medicare Other | Admitting: Oncology

## 2013-06-06 VITALS — BP 103/63 | HR 88 | Temp 97.3°F | Resp 19 | Ht 72.0 in | Wt 262.4 lb

## 2013-06-06 DIAGNOSIS — C7952 Secondary malignant neoplasm of bone marrow: Secondary | ICD-10-CM

## 2013-06-06 DIAGNOSIS — E291 Testicular hypofunction: Secondary | ICD-10-CM

## 2013-06-06 DIAGNOSIS — C7951 Secondary malignant neoplasm of bone: Secondary | ICD-10-CM

## 2013-06-06 DIAGNOSIS — C61 Malignant neoplasm of prostate: Secondary | ICD-10-CM

## 2013-06-06 MED ORDER — BICALUTAMIDE 50 MG PO TABS: 50.0000 mg | ORAL_TABLET | Freq: Every day | ORAL | Status: DC

## 2013-06-06 NOTE — Telephone Encounter (Signed)
gave pt appt for lab and MD on may 2015

## 2013-06-06 NOTE — Progress Notes (Signed)
Hematology and Oncology Follow Up Visit  Clarence Pollard [id number] [birth year] 73 y.o. 06/06/2013 3:19 PM Clarence Pollard, MDElkins, Clarence Pollard, *   Principle Diagnosis: 73 year old gentleman with prostate cancer diagnosed in July of 2014. He presented with a Gleason score 5+4 = 9 and a PSA of 34.6. 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   Prior Therapy: He is status post radiation therapy to the thoracic spine between T7 and T8-11. Therapy concluded in August of 2014.  Current therapy: Lupron given every 3 months under the care of Dr. Janice Norrie. [name] given monthly under the care of Dr. Janice Norrie  Interim History:  Clarence Pollard presents today for a followup visit. He is a pleasant gentleman with advanced prostate cancer that is thought to be still hormone sensitive. Since the last visit, his been doing very well. Has not reported any increased pain or decline in his energy her performance status. Has not reported any abdominal discomfort. Has not reported any hematochezia or monitor. Has not reported any genitourinary complaints. Has not reported any decline in his appetite or ability to perform activities of daily living. He does report weight loss that is unintentional but no bone pain.  Medications: I have reviewed the patient's current medications.  Current Outpatient Prescriptions  Medication Sig Dispense Refill  . bicalutamide (CASODEX) 50 MG tablet Take 1 tablet (50 mg total) by mouth daily.  60 tablet  1  . brimonidine (ALPHAGAN P) 0.1 % SOLN Apply 1 drop to eye 2 (two) times daily. Both eyes      . Calcium Carbonate-Vitamin D (CALCIUM-VITAMIN D) 500-200 MG-UNIT per tablet Take 1 tablet by mouth 2 (two) times daily with a meal.      . ergocalciferol (VITAMIN D2) 50000 UNITS capsule Take 50,000 Units by mouth every other day.      . Magnesium 500 MG CAPS Take 500 mg by mouth daily.      . travoprost, benzalkonium, (TRAVATAN) 0.004 % ophthalmic  solution 1 drop at bedtime.       No current facility-administered medications for this visit.     Allergies: No Known Allergies  Past Medical History, Surgical history, Social history, and Family History were reviewed and updated.  Review of Systems: Constitutional:  Negative for fever, chills, night sweats, anorexia, weight loss, pain. Cardiovascular: no chest pain or dyspnea on exertion Respiratory: no cough, shortness of breath, or wheezing Neurological: no TIA or stroke symptoms Dermatological: negative ENT: negative Skin: Negative. Gastrointestinal: no abdominal pain, change in bowel habits, or black or bloody stools Genito-Urinary: no dysuria, trouble voiding, or hematuria Hematological and Lymphatic: negative for - bruising, fatigue or swollen lymph nodes Breast: negative Musculoskeletal: negative for - joint stiffness, joint swelling or muscle pain Remaining ROS negative. Physical Exam: Blood pressure 103/63, pulse 88, temperature 97.3 F (36.3 C), temperature source Oral, resp. rate 19, height 6' (1.829 m), weight 262 lb 6.4 oz (119.024 kg). ECOG: 1 General appearance: alert and cooperative Head: Normocephalic, without obvious abnormality, atraumatic Neck: no adenopathy, no carotid bruit, no JVD, supple, symmetrical, trachea midline and thyroid not enlarged, symmetric, no tenderness/mass/nodules Lymph nodes: Cervical, supraclavicular, and axillary nodes normal. Heart:regular rate and rhythm, S1, S2 normal, no murmur, click, rub or gallop Lung:chest clear, no wheezing, rales, normal symmetric air entry Abdomin: soft, non-tender, without masses or organomegaly EXT:no erythema, induration, or nodules   Lab Results: Lab Results  Component Value Date   WBC 8.2 06/04/2013   HGB 10.5* 06/04/2013  HCT 33.7* 06/04/2013   MCV 78.8* 06/04/2013   PLT 280 06/04/2013     Chemistry      Component Value Date/Time   NA 140 06/04/2013 1233   K 4.6 06/04/2013 1233   CO2 24  06/04/2013 1233   BUN 17.1 06/04/2013 1233   CREATININE 0.9 06/04/2013 1233      Component Value Date/Time   CALCIUM 8.9 06/04/2013 1233   ALKPHOS 108 06/04/2013 1233   AST 16 06/04/2013 1233   ALT 12 06/04/2013 1233   BILITOT 0.38 06/04/2013 1233      Results for Clarence Pollard, Clarence Pollard (MRN [id number]) as of 06/06/2013 15:04  Ref. Range 11/29/2012 13:21 06/04/2013 12:33  PSA Latest Range: <=4.00 ng/mL 0.94 5.47 (H)     Impression and Plan:  73 year old gentleman with the following issues:  1. Advanced prostate cancer with metastatic disease to the bone. He is currently on Lupron therapy and had an excellent response initially since the start of this medication in July of 2014. His PSA dropped to 0.94 in September 2014. Most recently his PSA was around 2.5 and currently it is up to 5.47. It is very possible that he could be developing castration resistant disease given his high risk prostate cancer. Options of treatments were discussed today including second line hormonal manipulation and we feel that adding Casodex for combined androgen blockade would be the way to go. Risks and benefits of this medication were discussed. He is to include hot flashes, breast tenderness as well as GI toxicity were discussed. His agreeable to proceed and I will check his PSA and testosterone level in about 6 weeks. Other options we can consider would be Clarence Pollard, Provenge immune therapy and possibly chemotherapy.  2. Androgen depravation: I recommended he continue on Lupron every 3 months with Dr. Janice Norrie.   3. Bone health: I encouraged him to continue with his calcium supplements as well as monthly Xgeva.   Clarence Button, MD 3/20/20153:19 PM

## 2013-06-06 NOTE — Progress Notes (Signed)
Per MD, Casodex refill called into patient's [facility] on [address].

## 2013-07-22 ENCOUNTER — Other Ambulatory Visit (HOSPITAL_BASED_OUTPATIENT_CLINIC_OR_DEPARTMENT_OTHER): Payer: Medicare Other

## 2013-07-22 DIAGNOSIS — C7951 Secondary malignant neoplasm of bone: Secondary | ICD-10-CM

## 2013-07-22 DIAGNOSIS — C7952 Secondary malignant neoplasm of bone marrow: Secondary | ICD-10-CM

## 2013-07-22 DIAGNOSIS — C61 Malignant neoplasm of prostate: Secondary | ICD-10-CM

## 2013-07-22 LAB — CBC WITH DIFFERENTIAL/PLATELET
BASO%: 0.6 % (ref 0.0–2.0)
BASOS ABS: 0.1 10*3/uL (ref 0.0–0.1)
EOS ABS: 0.3 10*3/uL (ref 0.0–0.5)
EOS%: 3 % (ref 0.0–7.0)
HEMATOCRIT: 30.4 % — AB (ref 38.4–49.9)
HEMOGLOBIN: 9.6 g/dL — AB (ref 13.0–17.1)
LYMPH#: 1.3 10*3/uL (ref 0.9–3.3)
LYMPH%: 14.9 % (ref 14.0–49.0)
MCH: 25.2 pg — ABNORMAL LOW (ref 27.2–33.4)
MCHC: 31.5 g/dL — ABNORMAL LOW (ref 32.0–36.0)
MCV: 79.8 fL (ref 79.3–98.0)
MONO#: 0.7 10*3/uL (ref 0.1–0.9)
MONO%: 8.5 % (ref 0.0–14.0)
NEUT%: 73 % (ref 39.0–75.0)
NEUTROS ABS: 6.4 10*3/uL (ref 1.5–6.5)
Platelets: 308 10*3/uL (ref 140–400)
RBC: 3.8 10*6/uL — ABNORMAL LOW (ref 4.20–5.82)
RDW: 17.4 % — ABNORMAL HIGH (ref 11.0–14.6)
WBC: 8.8 10*3/uL (ref 4.0–10.3)

## 2013-07-22 LAB — COMPREHENSIVE METABOLIC PANEL (CC13)
ALT: 9 U/L (ref 0–55)
ANION GAP: 10 meq/L (ref 3–11)
AST: 14 U/L (ref 5–34)
Albumin: 3.3 g/dL — ABNORMAL LOW (ref 3.5–5.0)
Alkaline Phosphatase: 111 U/L (ref 40–150)
BUN: 14.7 mg/dL (ref 7.0–26.0)
CALCIUM: 9.2 mg/dL (ref 8.4–10.4)
CHLORIDE: 108 meq/L (ref 98–109)
CO2: 25 meq/L (ref 22–29)
CREATININE: 1.1 mg/dL (ref 0.7–1.3)
GLUCOSE: 130 mg/dL (ref 70–140)
Potassium: 3.8 mEq/L (ref 3.5–5.1)
Sodium: 144 mEq/L (ref 136–145)
Total Bilirubin: 0.42 mg/dL (ref 0.20–1.20)
Total Protein: 7.5 g/dL (ref 6.4–8.3)

## 2013-07-23 LAB — TESTOSTERONE: Testosterone: 26 ng/dL — ABNORMAL LOW (ref 300–890)

## 2013-07-23 LAB — PSA: PSA: 4.61 ng/mL — ABNORMAL HIGH (ref <=4.00)

## 2013-07-25 ENCOUNTER — Ambulatory Visit (HOSPITAL_BASED_OUTPATIENT_CLINIC_OR_DEPARTMENT_OTHER): Payer: Medicare Other | Admitting: Oncology

## 2013-07-25 ENCOUNTER — Telehealth: Payer: Self-pay | Admitting: Oncology

## 2013-07-25 ENCOUNTER — Encounter: Payer: Self-pay | Admitting: Oncology

## 2013-07-25 VITALS — BP 120/50 | HR 83 | Temp 98.2°F | Resp 20 | Ht 72.0 in | Wt 265.2 lb

## 2013-07-25 DIAGNOSIS — C7951 Secondary malignant neoplasm of bone: Secondary | ICD-10-CM

## 2013-07-25 DIAGNOSIS — C7952 Secondary malignant neoplasm of bone marrow: Secondary | ICD-10-CM

## 2013-07-25 DIAGNOSIS — E291 Testicular hypofunction: Secondary | ICD-10-CM

## 2013-07-25 DIAGNOSIS — C61 Malignant neoplasm of prostate: Secondary | ICD-10-CM

## 2013-07-25 NOTE — Telephone Encounter (Signed)
gv adn printed appt sched anda vs for pt for Aug °

## 2013-07-25 NOTE — Progress Notes (Signed)
Hematology and Oncology Follow Up Visit  Clarence Pollard [id number] 1940-12-11 73 y.o. 07/25/2013 4:14 PM Clarence Pollard, MDElkins, Curt Jews, *   Principle Diagnosis: 73 year old gentleman with prostate cancer diagnosed in July of 2014. He presented with a Gleason score 5+4 = 9 and a PSA of 34.6. 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   Prior Therapy: He is status post radiation therapy to the thoracic spine between T7 and T8-11. Therapy concluded in August of 2014.  Current therapy: Casodex 50 mg daily started in March of 2015. Lupron given every 3 months under the care of Dr. Janice Norrie. [name] given monthly under the care of Dr. Janice Norrie  Interim History:  Clarence Pollard presents today for a followup visit. He is a pleasant gentleman with advanced prostate cancer. Since the last visit, he was started on Casodex and have tolerated it welll. Has not reported any increased pain or decline in his energy her performance status. Has not reported any abdominal discomfort. Has not reported any hematochezia or monitor. Has not reported any genitourinary complaints. Has not reported any decline in his appetite or ability to perform activities of daily living. He does report weight loss that is unintentional but no bone pain. Did not report any recent hospitalization or illnesses. He continued to have a reasonable performance status.  Medications: I have reviewed the patient's current medications.  Current Outpatient Prescriptions  Medication Sig Dispense Refill  . OVER THE COUNTER MEDICATION Take by mouth daily. Calcium Citrate 600 mg + Vit D 800 i.u. (2 Tablets daily).      . bicalutamide (CASODEX) 50 MG tablet Take 1 tablet (50 mg total) by mouth daily.  60 tablet  1  . brimonidine (ALPHAGAN P) 0.1 % SOLN Apply 1 drop to eye 2 (two) times daily. Both eyes      . Calcium Carbonate-Vitamin D (CALCIUM-VITAMIN D) 500-200 MG-UNIT per tablet Take 1 tablet by mouth 2  (two) times daily with a meal.      . ergocalciferol (VITAMIN D2) 50000 UNITS capsule Take 50,000 Units by mouth every other day.      . Magnesium 500 MG CAPS Take 500 mg by mouth daily.      . travoprost, benzalkonium, (TRAVATAN) 0.004 % ophthalmic solution 1 drop at bedtime.       No current facility-administered medications for this visit.     Allergies: No Known Allergies  Past Medical History, Surgical history, Social history, and Family History were reviewed and updated.  Review of Systems: Constitutional:  Negative for fever, chills, night sweats, anorexia, weight loss, pain. Cardiovascular: no chest pain or dyspnea on exertion Respiratory: no cough, shortness of breath, or wheezing Neurological: no TIA or stroke symptoms Dermatological: negative ENT: negative Skin: Negative. Gastrointestinal: no abdominal pain, change in bowel habits, or black or bloody stools Genito-Urinary: no dysuria, trouble voiding, or hematuria Hematological and Lymphatic: negative for - bruising, fatigue or swollen lymph nodes Breast: negative Musculoskeletal: negative for - joint stiffness, joint swelling or muscle pain Remaining ROS negative. Physical Exam: Blood pressure 120/50, pulse 83, temperature 98.2 F (36.8 C), temperature source Oral, resp. rate 20, height 6' (1.829 m), weight 265 lb 3.2 oz (120.294 kg), SpO2 97.00%. ECOG: 1 General appearance: alert and cooperative Head: Normocephalic, without obvious abnormality, atraumatic Neck: no adenopathy, no carotid bruit, no JVD, supple, symmetrical, trachea midline and thyroid not enlarged, symmetric, no tenderness/mass/nodules Lymph nodes: Cervical, supraclavicular, and axillary nodes normal. Heart:regular rate and rhythm, S1, S2  normal, no murmur, click, rub or gallop Lung:chest clear, no wheezing, rales, normal symmetric air entry Abdomin: soft, non-tender, without masses or organomegaly EXT:no erythema, induration, or nodules   Lab  Results: Lab Results  Component Value Date   WBC 8.8 07/22/2013   HGB 9.6* 07/22/2013   HCT 30.4* 07/22/2013   MCV 79.8 07/22/2013   PLT 308 07/22/2013     Chemistry      Component Value Date/Time   NA 144 07/22/2013 1357   K 3.8 07/22/2013 1357   CO2 25 07/22/2013 1357   BUN 14.7 07/22/2013 1357   CREATININE 1.1 07/22/2013 1357      Component Value Date/Time   CALCIUM 9.2 07/22/2013 1357   ALKPHOS 111 07/22/2013 1357   AST 14 07/22/2013 1357   ALT 9 07/22/2013 1357   BILITOT 0.42 07/22/2013 1357      Results for Clarence Pollard (MRN [id number]) as of 07/25/2013 14:38  Ref. Range 06/04/2013 12:33 07/22/2013 13:57  PSA Latest Range: <=4.00 ng/mL 5.47 (H) 4.61 (H)      Impression and Plan:  73 year old gentleman with the following issues:  1. Advanced prostate cancer with metastatic disease to the bone. He is currently on Lupron and Casodex was added in March of 2015. His PSA did drop down to 4.6 without any major complications to the medication. I plan to continue him on this current medication and recheck his PSA in about 3 months. If his PSA continues to be stable, we'll continue on this current regimen. If he develops a rise in his PSA at that time we'll consider alternative medications.  2. Androgen depravation: I recommended he continue on Lupron every 3 months with Dr. Janice Norrie.   3. Bone health: I encouraged him to continue with his calcium supplements as well as monthly Xgeva.   Clarence Portela, MD 5/8/20154:14 PM

## 2013-08-12 ENCOUNTER — Encounter: Payer: Self-pay | Admitting: Oncology

## 2013-10-09 ENCOUNTER — Other Ambulatory Visit: Payer: Self-pay | Admitting: Oncology

## 2013-10-24 ENCOUNTER — Telehealth: Payer: Self-pay | Admitting: Oncology

## 2013-10-24 ENCOUNTER — Ambulatory Visit (HOSPITAL_BASED_OUTPATIENT_CLINIC_OR_DEPARTMENT_OTHER): Payer: Medicare Other | Admitting: Oncology

## 2013-10-24 VITALS — BP 106/59 | HR 90 | Temp 97.8°F | Resp 18 | Ht 72.0 in | Wt 259.3 lb

## 2013-10-24 DIAGNOSIS — E291 Testicular hypofunction: Secondary | ICD-10-CM

## 2013-10-24 DIAGNOSIS — C61 Malignant neoplasm of prostate: Secondary | ICD-10-CM

## 2013-10-24 DIAGNOSIS — C7952 Secondary malignant neoplasm of bone marrow: Secondary | ICD-10-CM

## 2013-10-24 DIAGNOSIS — C7951 Secondary malignant neoplasm of bone: Secondary | ICD-10-CM

## 2013-10-24 NOTE — Progress Notes (Signed)
Hematology and Oncology Follow Up Visit  Clarence Pollard [id number] 01-23-1941 73 y.o. 10/24/2013 1:38 PM [name], MDElkins, Curt Jews, *   Principle Diagnosis: 73 year old gentleman with prostate cancer diagnosed in July of 2014. He presented with a Gleason score 5+4 = 9 and a PSA of 34.6. 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   Prior Therapy: He is status post radiation therapy to the thoracic spine between T7 and T8-11. Therapy concluded in August of 2014.  Current therapy: Casodex 50 mg daily started in March of 2015. Lupron given every 3 months under the care of Dr. Janice Norrie. [name] given monthly under the care of Dr. Janice Norrie  Interim History:  Clarence Pollard presents today for a followup visit. Since the last visit, he developed diverticulitis as well as shingles. He is recovering from both without any late sequelae. He continues to be  on Casodex and have tolerated it welll. Has not reported any increased pain or decline in his energy her performance status. Has not reported any abdominal discomfort since he has been treated for diverticulitis. Has not reported any hematochezia or melena. Has not reported any genitourinary complaints. Has not reported any decline in his appetite or ability to perform activities of daily living. He does report weight loss that is unintentional but no bone pain. Did not report any recent hospitalization or illnesses. He continued to have a reasonable performance status. Rest of his review of system is unremarkable.  Medications: I have reviewed the patient's current medications.  Current Outpatient Prescriptions  Medication Sig Dispense Refill  . bicalutamide (CASODEX) 50 MG tablet TAKE 1 TABLET BY MOUTH EVERY DAY  60 tablet  1  . brimonidine (ALPHAGAN P) 0.1 % SOLN Apply 1 drop to eye 2 (two) times daily. Both eyes      . Calcium Carbonate-Vitamin D (CALCIUM-VITAMIN D) 500-200 MG-UNIT per tablet Take 1 tablet by  mouth 2 (two) times daily with a meal.      . ergocalciferol (VITAMIN D2) 50000 UNITS capsule Take 50,000 Units by mouth every other day.      . Magnesium 500 MG CAPS Take 500 mg by mouth daily.      . metroNIDAZOLE (FLAGYL) 500 MG tablet Take 1 tablet by mouth every 6 (six) hours.      Marland Kitchen OVER THE COUNTER MEDICATION Take by mouth daily. Calcium Citrate 600 mg + Vit D 800 i.u. (2 Tablets daily).      Marland Kitchen sulfamethoxazole-trimethoprim (BACTRIM DS) 800-160 MG per tablet Take 1 tablet by mouth 2 (two) times daily.      . travoprost, benzalkonium, (TRAVATAN) 0.004 % ophthalmic solution 1 drop at bedtime.       No current facility-administered medications for this visit.     Allergies: No Known Allergies  Past Medical History, Surgical history, Social history, and Family History were reviewed and updated.   Physical Exam: Blood pressure 106/59, pulse 90, temperature 97.8 F (36.6 C), temperature source Oral, resp. rate 18, height 6' (1.829 m), weight 259 lb 4.8 oz (117.618 kg), SpO2 97.00%. ECOG: 1 General appearance: alert and cooperative Head: Normocephalic, without obvious abnormality, atraumatic Neck: no adenopathy Lymph nodes: Cervical, supraclavicular, and axillary nodes normal. Heart:regular rate and rhythm, S1, S2 normal, no murmur, click, rub or gallop Lung:chest clear, no wheezing, rales, normal symmetric air entry Abdomin: soft, non-tender, without masses or organomegaly EXT:no erythema, induration, or nodules   Lab Results: Lab Results  Component Value Date   WBC 8.8  07/22/2013   HGB 9.6* 07/22/2013   HCT 30.4* 07/22/2013   MCV 79.8 07/22/2013   PLT 308 07/22/2013     Chemistry      Component Value Date/Time   NA 144 07/22/2013 1357   K 3.8 07/22/2013 1357   CO2 25 07/22/2013 1357   BUN 14.7 07/22/2013 1357   CREATININE 1.1 07/22/2013 1357      Component Value Date/Time   CALCIUM 9.2 07/22/2013 1357   ALKPHOS 111 07/22/2013 1357   AST 14 07/22/2013 1357   ALT 9 07/22/2013 1357   BILITOT  0.42 07/22/2013 1357        Results for Clarence Pollard, Clarence Pollard (MRN [id number]) as of 10/24/2013 13:31  Ref. Range 06/04/2013 12:33 07/22/2013 13:57  PSA Latest Range: <=4.00 ng/mL 5.47 (H) 4.61 (H)     Impression and Plan:  73 year old gentleman with the following issues:  1. Advanced prostate cancer with metastatic disease to the bone. He is currently on Lupron and Casodex was added in March of 2015. His PSA did drop down to 4.6 without any major complications to the medication. I plan to continue him on this current medication and recheck regularly. If his PSA continues to be under control, will continue Casodex. If his PSA starts to rise we willl use alternative treatment such as Xtandi or Zytiga.  2. Androgen depravation: I recommended he continue on Lupron every 3 months with Dr. Janice Norrie.   3. Bone health: I encouraged him to continue with his calcium supplements as well as monthly Xgeva.   Zola Button, MD 8/7/20151:38 PM

## 2013-10-24 NOTE — Telephone Encounter (Signed)
Pt confirmed labs/ov per 08/07 POF, gave pt AVS....KJ °

## 2013-12-19 ENCOUNTER — Telehealth: Payer: Self-pay | Admitting: Oncology

## 2013-12-19 NOTE — Telephone Encounter (Signed)
s.w ruth at [facility] urology...pt need sooner appt...first available is 10:30 ...ruth is contacting pt

## 2014-01-16 ENCOUNTER — Telehealth: Payer: Self-pay | Admitting: Oncology

## 2014-01-16 ENCOUNTER — Ambulatory Visit (HOSPITAL_BASED_OUTPATIENT_CLINIC_OR_DEPARTMENT_OTHER): Payer: Medicare Other | Admitting: Oncology

## 2014-01-16 VITALS — BP 126/57 | HR 87 | Temp 97.5°F | Resp 20 | Ht 72.0 in | Wt 266.5 lb

## 2014-01-16 DIAGNOSIS — C7951 Secondary malignant neoplasm of bone: Secondary | ICD-10-CM

## 2014-01-16 DIAGNOSIS — C61 Malignant neoplasm of prostate: Secondary | ICD-10-CM

## 2014-01-16 MED ORDER — TRAZODONE 25 MG HALF TABLET: 25.0000 mg | ORAL_TABLET | Freq: Every evening | ORAL | Status: DC | PRN

## 2014-01-16 NOTE — Telephone Encounter (Signed)
gv adn printed appt sched and avs for pt for Dec °

## 2014-01-16 NOTE — Progress Notes (Signed)
Hematology and Oncology Follow Up Visit  Clarence Pollard [id number] 06-26-1940 73 y.o. 01/16/2014 9:34 AM Clarence Pollard, MDElkins, Clarence Pollard, *   Principle Diagnosis: 73 year old gentleman with prostate cancer diagnosed in July of 2014. He presented with a Gleason score 5+4 = 9 and a PSA of 34.6. 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   Prior Therapy: He is status post radiation therapy to the thoracic spine between T7 and T8-11. Therapy concluded in August of 2014.  Current therapy: Casodex 50 mg daily started in March of 2015. Lupron given every 3 months under the care of Clarence Pollard. [name] given monthly under the care of Clarence Pollard  Interim History:  [name] presents today for a followup visit. Since the last visit, he reports no major symptoms. He did have a gout flare and some occasional insomnia. He is recovering from his gout episode without any late sequelae. He continues to be  on Casodex and have tolerated it welll. Has not reported any increased pain or decline in his energy her performance status. Has not reported any abdominal discomfort. Has not reported any hematochezia or melena. Has not reported any genitourinary complaints. Has not reported any decline in his appetite or ability to perform activities of daily living. He does report weight loss that is unintentional but no bone pain. Did not report any recent hospitalization or illnesses. He continued to have a reasonable performance status. Rest of his review of system is unremarkable.  Medications: I have reviewed the patient's current medications.  Current Outpatient Prescriptions  Medication Sig Dispense Refill  . indomethacin (INDOCIN) 25 MG capsule Take 25 mg by mouth 4 (four) times daily. 1-2 caps      . bicalutamide (CASODEX) 50 MG tablet TAKE 1 TABLET BY MOUTH EVERY DAY  60 tablet  1  . brimonidine (ALPHAGAN P) 0.1 % SOLN Apply 1 drop to eye 2 (two) times daily. Both  eyes      . Calcium Carbonate-Vitamin D (CALCIUM-VITAMIN D) 500-200 MG-UNIT per tablet Take 1 tablet by mouth 2 (two) times daily with a meal.      . ergocalciferol (VITAMIN D2) 50000 UNITS capsule Take 50,000 Units by mouth every other day.      . Magnesium 500 MG CAPS Take 500 mg by mouth daily.      . metroNIDAZOLE (FLAGYL) 500 MG tablet Take 1 tablet by mouth every 6 (six) hours.      Marland Kitchen OVER THE COUNTER MEDICATION Take by mouth daily. Calcium Citrate 600 mg + Vit D 800 i.u. (2 Tablets daily).      Marland Kitchen sulfamethoxazole-trimethoprim (BACTRIM DS) 800-160 MG per tablet Take 1 tablet by mouth 2 (two) times daily.      . travoprost, benzalkonium, (TRAVATAN) 0.004 % ophthalmic solution 1 drop at bedtime.       No current facility-administered medications for this visit.     Allergies: No Known Allergies  Past Medical History, Surgical history, Social history, and Family History were reviewed and updated.   Physical Exam: Blood pressure 126/57, pulse 87, temperature 97.5 F (36.4 C), temperature source Oral, resp. rate 20, height 6' (1.829 m), weight 266 lb 8 oz (120.884 kg). ECOG: 1 General appearance: alert and cooperative Head: Normocephalic, without obvious abnormality Neck: no adenopathy Lymph nodes: Cervical, supraclavicular, and axillary nodes normal. Heart:regular rate and rhythm, S1, S2 normal, no murmur, click, rub or gallop Lung:chest clear, no wheezing, rales, normal symmetric air entry Abdomin: soft, non-tender, without  masses or organomegaly EXT:no erythema, induration, or nodules   Lab Results: Lab Results  Component Value Date   WBC 8.8 07/22/2013   HGB 9.6* 07/22/2013   HCT 30.4* 07/22/2013   MCV 79.8 07/22/2013   PLT 308 07/22/2013     Chemistry      Component Value Date/Time   NA 144 07/22/2013 1357   K 3.8 07/22/2013 1357   CO2 25 07/22/2013 1357   BUN 14.7 07/22/2013 1357   CREATININE 1.1 07/22/2013 1357      Component Value Date/Time   CALCIUM 9.2 07/22/2013 1357    ALKPHOS 111 07/22/2013 1357   AST 14 07/22/2013 1357   ALT 9 07/22/2013 1357   BILITOT 0.42 07/22/2013 1357            Impression and Plan:  73 year old gentleman with the following issues:  1. Advanced prostate cancer with metastatic disease to the bone. He is currently on Lupron and Casodex was added in March of 2015. His PSA did drop down to 4.6 but most recently his PSA was up to 10. He is not reporting any major complications to the medication. Given the rise in his PSA, I would like to repeat a bone scan for staging purposes. If his PSA continues to rise and his bone scan shows progression of disease we willl use alternative treatment such as Xtandi or Zytiga.  2. Androgen depravation: I recommended he continue on Lupron every 3 months with Clarence Pollard.   3. Bone health: I encouraged him to continue with his calcium supplements as well as monthly Xgeva.   [facility], MD 10/30/20159:34 AM

## 2014-01-26 ENCOUNTER — Inpatient Hospital Stay (HOSPITAL_COMMUNITY)
Admission: EM | Admit: 2014-01-26 | Discharge: 2014-02-04 | DRG: 029 | Disposition: A | Discharge status: Rehabilitation Facility | Payer: Medicare Other | Attending: Internal Medicine | Admitting: Internal Medicine

## 2014-01-26 ENCOUNTER — Emergency Department (HOSPITAL_COMMUNITY): Payer: Medicare Other

## 2014-01-26 ENCOUNTER — Encounter (HOSPITAL_COMMUNITY): Payer: Self-pay

## 2014-01-26 DIAGNOSIS — Z6834 Body mass index (BMI) 34.0-34.9, adult: Secondary | ICD-10-CM

## 2014-01-26 DIAGNOSIS — C7949 Secondary malignant neoplasm of other parts of nervous system: Secondary | ICD-10-CM | POA: Diagnosis not present

## 2014-01-26 DIAGNOSIS — R739 Hyperglycemia, unspecified: Secondary | ICD-10-CM | POA: Diagnosis not present

## 2014-01-26 DIAGNOSIS — G9520 Unspecified cord compression: Secondary | ICD-10-CM

## 2014-01-26 DIAGNOSIS — D489 Neoplasm of uncertain behavior, unspecified: Secondary | ICD-10-CM

## 2014-01-26 DIAGNOSIS — E876 Hypokalemia: Secondary | ICD-10-CM | POA: Diagnosis present

## 2014-01-26 DIAGNOSIS — Z79899 Other long term (current) drug therapy: Secondary | ICD-10-CM

## 2014-01-26 DIAGNOSIS — G8220 Paraplegia, unspecified: Secondary | ICD-10-CM | POA: Diagnosis present

## 2014-01-26 DIAGNOSIS — G9529 Other cord compression: Secondary | ICD-10-CM | POA: Diagnosis present

## 2014-01-26 DIAGNOSIS — R531 Weakness: Secondary | ICD-10-CM

## 2014-01-26 DIAGNOSIS — Z452 Encounter for adjustment and management of vascular access device: Secondary | ICD-10-CM

## 2014-01-26 DIAGNOSIS — D649 Anemia, unspecified: Secondary | ICD-10-CM | POA: Diagnosis present

## 2014-01-26 DIAGNOSIS — IMO0002 Reserved for concepts with insufficient information to code with codable children: Secondary | ICD-10-CM | POA: Diagnosis present

## 2014-01-26 DIAGNOSIS — R29898 Other symptoms and signs involving the musculoskeletal system: Secondary | ICD-10-CM

## 2014-01-26 DIAGNOSIS — Z87891 Personal history of nicotine dependence: Secondary | ICD-10-CM

## 2014-01-26 DIAGNOSIS — C7931 Secondary malignant neoplasm of brain: Secondary | ICD-10-CM

## 2014-01-26 DIAGNOSIS — Z923 Personal history of irradiation: Secondary | ICD-10-CM

## 2014-01-26 DIAGNOSIS — G4700 Insomnia, unspecified: Secondary | ICD-10-CM | POA: Diagnosis present

## 2014-01-26 DIAGNOSIS — C61 Malignant neoplasm of prostate: Secondary | ICD-10-CM | POA: Diagnosis present

## 2014-01-26 DIAGNOSIS — C7951 Secondary malignant neoplasm of bone: Secondary | ICD-10-CM | POA: Diagnosis present

## 2014-01-26 DIAGNOSIS — T380X5A Adverse effect of glucocorticoids and synthetic analogues, initial encounter: Secondary | ICD-10-CM | POA: Diagnosis not present

## 2014-01-26 DIAGNOSIS — D497 Neoplasm of unspecified behavior of endocrine glands and other parts of nervous system: Secondary | ICD-10-CM | POA: Insufficient documentation

## 2014-01-26 LAB — COMPREHENSIVE METABOLIC PANEL
ALT: 14 U/L (ref 0–53)
ANION GAP: 15 (ref 5–15)
AST: 22 U/L (ref 0–37)
Albumin: 3.9 g/dL (ref 3.5–5.2)
Alkaline Phosphatase: 183 U/L — ABNORMAL HIGH (ref 39–117)
BILIRUBIN TOTAL: 0.5 mg/dL (ref 0.3–1.2)
BUN: 11 mg/dL (ref 6–23)
CHLORIDE: 103 meq/L (ref 96–112)
CO2: 23 mEq/L (ref 19–32)
Calcium: 8.8 mg/dL (ref 8.4–10.5)
Creatinine, Ser: 0.77 mg/dL (ref 0.50–1.35)
GFR calc non Af Amer: 89 mL/min — ABNORMAL LOW (ref >90)
GLUCOSE: 92 mg/dL (ref 70–99)
Potassium: 3.5 mEq/L — ABNORMAL LOW (ref 3.7–5.3)
SODIUM: 141 meq/L (ref 137–147)
Total Protein: 9.2 g/dL — ABNORMAL HIGH (ref 6.0–8.3)

## 2014-01-26 LAB — I-STAT CHEM 8, ED
BUN: 8 mg/dL (ref 6–23)
CALCIUM ION: 1.06 mmol/L — AB (ref 1.13–1.30)
CHLORIDE: 107 meq/L (ref 96–112)
CREATININE: 0.8 mg/dL (ref 0.50–1.35)
Glucose, Bld: 94 mg/dL (ref 70–99)
HCT: 30 % — ABNORMAL LOW (ref 39.0–52.0)
Hemoglobin: 10.2 g/dL — ABNORMAL LOW (ref 13.0–17.0)
POTASSIUM: 3.4 meq/L — AB (ref 3.7–5.3)
Sodium: 143 mEq/L (ref 137–147)
TCO2: 23 mmol/L (ref 0–100)

## 2014-01-26 LAB — URINALYSIS, ROUTINE W REFLEX MICROSCOPIC
Glucose, UA: NEGATIVE mg/dL
KETONES UR: NEGATIVE mg/dL
NITRITE: NEGATIVE
PH: 5.5 (ref 5.0–8.0)
Protein, ur: NEGATIVE mg/dL
SPECIFIC GRAVITY, URINE: 1.02 (ref 1.005–1.030)
Urobilinogen, UA: 0.2 mg/dL (ref 0.0–1.0)

## 2014-01-26 LAB — CBC WITH DIFFERENTIAL/PLATELET
Basophils Absolute: 0 10*3/uL (ref 0.0–0.1)
Basophils Relative: 1 % (ref 0–1)
Eosinophils Absolute: 0.4 10*3/uL (ref 0.0–0.7)
Eosinophils Relative: 5 % (ref 0–5)
HCT: 33.2 % — ABNORMAL LOW (ref 39.0–52.0)
HEMOGLOBIN: 10.1 g/dL — AB (ref 13.0–17.0)
LYMPHS PCT: 23 % (ref 12–46)
Lymphs Abs: 1.8 10*3/uL (ref 0.7–4.0)
MCH: 24.2 pg — ABNORMAL LOW (ref 26.0–34.0)
MCHC: 30.4 g/dL (ref 30.0–36.0)
MCV: 79.6 fL (ref 78.0–100.0)
MONOS PCT: 7 % (ref 3–12)
Monocytes Absolute: 0.5 10*3/uL (ref 0.1–1.0)
NEUTROS ABS: 5.4 10*3/uL (ref 1.7–7.7)
NEUTROS PCT: 66 % (ref 43–77)
PLATELETS: 335 10*3/uL (ref 150–400)
RBC: 4.17 MIL/uL — ABNORMAL LOW (ref 4.22–5.81)
RDW: 18.2 % — ABNORMAL HIGH (ref 11.5–15.5)
WBC: 8.1 10*3/uL (ref 4.0–10.5)

## 2014-01-26 LAB — URINE MICROSCOPIC-ADD ON

## 2014-01-26 LAB — I-STAT CREATININE, ED: Creatinine, Ser: 0.8 mg/dL (ref 0.50–1.35)

## 2014-01-26 MED ORDER — DIAZEPAM 5 MG/ML IJ SOLN
2.5000 mg | Freq: Once | INTRAMUSCULAR | Status: AC
  Administered 2014-01-26: 2.5 mg via INTRAVENOUS
  Filled 2014-01-26: qty 2

## 2014-01-26 MED ORDER — GADOBENATE DIMEGLUMINE 529 MG/ML IV SOLN: 20.0000 mL | Freq: Once | INTRAVENOUS | Status: DC | PRN

## 2014-01-26 MED ORDER — FENTANYL CITRATE 0.05 MG/ML IJ SOLN
50.0000 ug | Freq: Once | INTRAMUSCULAR | Status: AC
  Administered 2014-01-26: 50 ug via INTRAVENOUS
  Filled 2014-01-26: qty 2

## 2014-01-26 MED ORDER — DEXAMETHASONE SODIUM PHOSPHATE 10 MG/ML IJ SOLN
10.0000 mg | Freq: Four times a day (QID) | INTRAMUSCULAR | Status: DC
  Administered 2014-01-26 – 2014-01-31 (×17): 10 mg via INTRAVENOUS
  Filled 2014-01-26 (×19): qty 1

## 2014-01-26 MED ORDER — SODIUM CHLORIDE 0.9 % IV BOLUS (SEPSIS)
250.0000 mL | Freq: Once | INTRAVENOUS | Status: AC
  Administered 2014-01-26: 250 mL via INTRAVENOUS

## 2014-01-26 NOTE — ED Notes (Signed)
Pt remains in MRI 

## 2014-01-26 NOTE — ED Notes (Signed)
Pt was reminded of the need for urine, still unable to provide a sample at this time.

## 2014-01-26 NOTE — ED Provider Notes (Signed)
CSN: [id number]     Arrival date & time 01/26/14  1430 History   First MD Initiated Contact with Patient 01/26/14 1635     Chief Complaint  Patient presents with  . Extremity Weakness   HPI  Patient is a 73 y.o. 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  Patients states that he woke up suddenly on Wednesday morning and was having worsening bilateral leg cramping and leg weakness.  Patient states that his legs are 6/10 pain and he feels that they are about to give out when he walks. Patient states that he has had to use a walker and he normally does not have to use any ambulation assist devices.  Patient had some radiation therapy to his back previously.  He does admit to some new low back pain.  He admits to worsening ascending bilateral leg numbness up to the hips.  Patient denies saddle anesthesia and bladder or bowel incontinence.  Patient has had previous radiation for tumor in his thoracic spine.   Past Medical History  Diagnosis Date  . History of radiation therapy 10/14/12-10/25/12    T7-T11   . Prostate cancer   . Metastatic cancer to spine    Past Surgical History  Procedure Laterality Date  . Appendectomy    . Colon surgery    . Cholecystectomy    . Cataract extraction Bilateral    History reviewed. No pertinent family history. History  Substance Use Topics  . Smoking status: Former Research scientist (life sciences)  . Smokeless tobacco: Not on file  . Alcohol Use: No    Review of Systems  Constitutional: Negative for fever, chills and fatigue.  Respiratory: Negative for chest tightness and shortness of breath.   Cardiovascular: Negative for chest pain, palpitations and leg swelling.  Gastrointestinal: Negative for nausea, vomiting, abdominal pain, diarrhea, constipation, blood in stool and anal bleeding.  Genitourinary: Negative for dysuria, urgency, frequency, hematuria, flank pain,  enuresis and difficulty urinating.  Musculoskeletal: Positive for myalgias, back pain and gait problem. Negative for joint swelling, arthralgias, neck pain and neck stiffness.  Neurological: Positive for numbness.  All other systems reviewed and are negative.     Allergies  Review of patient's allergies indicates no known allergies.  Home Medications   Prior to Admission medications   Medication Sig Start Date End Date Taking? Authorizing Provider  benztropine (COGENTIN) 0.5 MG tablet Take 0.5 mg by mouth every 12 (twelve) hours.   Yes Historical Provider, MD  bicalutamide (CASODEX) 50 MG tablet Take 50 mg by mouth daily.   Yes Historical Provider, MD  brimonidine (ALPHAGAN P) 0.1 % SOLN Apply 1 drop to eye at bedtime. Both eyes   Yes Historical Provider, MD  Calcium Carbonate-Vitamin D (CALCIUM-VITAMIN D) 500-200 MG-UNIT per tablet Take 1 tablet by mouth 2 (two) times daily with a meal.   Yes Historical Provider, MD  ergocalciferol (VITAMIN D2) 50000 UNITS capsule Take 50,000 Units by mouth once a week. Every Saturday.   Yes Historical Provider, MD  ferrous sulfate 325 (65 FE) MG tablet Take 325 mg by mouth at bedtime.   Yes Historical Provider, MD  ibuprofen (ADVIL,MOTRIN) 600 MG tablet Take 600 mg by mouth 2 (two) times daily as needed for moderate pain (pain).   Yes Historical Provider, MD  OVER THE COUNTER MEDICATION Take by mouth daily. Calcium Citrate 600 mg + Vit D 800 i.u. (  2 Tablets daily).   Yes Historical Provider, MD  travoprost, benzalkonium, (TRAVATAN) 0.004 % ophthalmic solution Place 1 drop into both eyes at bedtime.    Yes Historical Provider, MD  traZODone (DESYREL) 25 mg TABS tablet Take 0.5 tablets (25 mg total) by mouth at bedtime as needed for sleep. 01/16/14   Wyatt Portela, MD   BP 148/64 mmHg  Pulse 80  Temp(Src) 97.5 F (36.4 C) (Oral)  Resp 18  SpO2 95% Physical Exam  Constitutional: He is oriented to person, place, and time. He appears well-developed and  well-nourished. No distress.  HENT:  Head: Normocephalic and atraumatic.  Mouth/Throat: Oropharynx is clear and moist. No oropharyngeal exudate.  Eyes: Conjunctivae and EOM are normal. Pupils are equal, round, and reactive to light. No scleral icterus.  Neck: Normal range of motion. Neck supple. No JVD present. No thyromegaly present.  Cardiovascular: Normal rate, regular rhythm and intact distal pulses.  Exam reveals no gallop and no friction rub.   No murmur heard. Pulmonary/Chest: Effort normal and breath sounds normal. No respiratory distress. He has no wheezes. He has no rales. He exhibits no tenderness.  Abdominal: Soft. Bowel sounds are normal. He exhibits no distension and no mass. There is no tenderness. There is no rebound and no guarding.  Musculoskeletal: Normal range of motion.  Patient rises slowly from sitting to standing.  They walk without an antalgic gait.  There is no evidence of erythema, ecchymosis, or gross deformity.  There is no tenderness to palpation over the lumbar spine or the paraspinal muscles.  Active ROM is full.  Sensation to light touch is intact over all extremities.  Strength is symmetric and equal in all extremities.     Lymphadenopathy:    He has no cervical adenopathy.  Neurological: He is alert and oriented to person, place, and time. He has normal strength. No cranial nerve deficit or sensory deficit. Coordination normal.  Skin: Skin is warm and dry. He is not diaphoretic.  Psychiatric: He has a normal mood and affect. His behavior is normal. Judgment and thought content normal.  Nursing note and vitals reviewed.   ED Course  Procedures (including critical care time) Labs Review Labs Reviewed  CBC WITH DIFFERENTIAL - Abnormal; Notable for the following:    RBC 4.17 (*)    Hemoglobin 10.1 (*)    HCT 33.2 (*)    MCH 24.2 (*)    RDW 18.2 (*)    All other components within normal limits  COMPREHENSIVE METABOLIC PANEL - Abnormal; Notable for the  following:    Potassium 3.5 (*)    Total Protein 9.2 (*)    Alkaline Phosphatase 183 (*)    GFR calc non Af Amer 89 (*)    All other components within normal limits  URINALYSIS, ROUTINE W REFLEX MICROSCOPIC - Abnormal; Notable for the following:    APPearance CLOUDY (*)    Hgb urine dipstick TRACE (*)    Bilirubin Urine SMALL (*)    Leukocytes, UA TRACE (*)    All other components within normal limits  URINE MICROSCOPIC-ADD ON - Abnormal; Notable for the following:    Squamous Epithelial / LPF FEW (*)    Bacteria, UA FEW (*)    Casts GRANULAR CAST (*)    Crystals CA OXALATE CRYSTALS (*)    All other components within normal limits  I-STAT CHEM 8, ED - Abnormal; Notable for the following:    Potassium 3.4 (*)    Calcium, Ion 1.06 (*)  Hemoglobin 10.2 (*)    HCT 30.0 (*)    All other components within normal limits  I-STAT CREATININE, ED    Imaging Review Mr Thoracic Spine Wo Contrast  01/26/2014   CLINICAL DATA:  Bilateral lower extremity weakness for a few months, increasing over last 5 days. Severe pain with activity. History of metastatic prostate cancer. History of T7 through T11 radiation for prostate metastasis, completed August 2014.  EXAM: MRI THORACIC AND LUMBAR SPINE WITHOUT CONTRAST  TECHNIQUE: Multiplanar and multiecho pulse sequences of the thoracic and lumbar spine were obtained without intravenous contrast. The patient did not wish to receive contrast.  COMPARISON:  MRI of the thoracic spine October 11, 2012  FINDINGS: MR THORACIC SPINE FINDINGS  Worsening diffuse osseous metastasis with low signal presumed sclerotic lesions. Lumbar vertebral bodies appear intact. Bright T1 signal within the T7 through T12 vertebral bodies consistent with radiation change. Minimal STIR signal associated with the low signal lesions consistent with bone marrow edema. No pathologic fracture.  Increasing soft tissue/tumor from T 8 through T10, extending into the epidural space (extending 4.3 cm  in craniocaudad dimension) resulting in severe canal stenosis at T9, in trefoil configuration, AP dimension of the thecal sac is 5 mm with cord compression. No convincing evidence of spinal cord edema. Tumor extends into the neural foramen resulting in at least moderate neural foraminal narrowing at T9-10. No syrinx. Soft tissue/tumor extends into the prevertebral soft tissues at T9.  Stranding of the RIGHT lung at the level of T8 through T10 may reflect postradiation change, incompletely characterized. Severe paraspinal muscle atrophy.  MR LUMBAR SPINE FINDINGS  Sagittal T2 sequence not obtained.  Lumbar vertebral bodies appear intact and aligned with maintenance of lumbar lordosis. Low signal metastasis an L1, and L2, without pathologic fracture. Severe L5-S1 degenerative disc. Severe subacute to chronic discogenic endplate change at H0-Q6, mild to moderate chronic discogenic endplate changes at V7-8, L3-4 and L4-5.  Conus medullaris terminates at L1 and appears normal morphology and signal characteristics. Slightly thickened appearance of the cauda equina, for example axial 38/62 could be accentuated by spinal canal is is an redundancy. Prevertebral soft tissues are nonsuspicious. Moderate to severe paraspinal muscle atrophy. Ankylosis of the sacroiliac joints.  Level by level evaluation (limited axial axial T2 sequence due to motion):  L1-2: Small broad-based disc bulge. Moderate facet arthropathy and ligamentum flavum redundancy without canal stenosis or neural foraminal narrowing.  L2-3: Small broad-based disc bulge with superimposed small bowel subarticular extra foraminal disc protrusion encroaching upon the exited LEFT L2 nerve. Moderate facet arthropathy and ligamentum flavum redundancy. Mild canal stenosis. Mild RIGHT, mild to moderate LEFT neural foraminal narrowing.  L3: Small broad-based disc bulge eccentric laterally may be 6 L3 nerve. Moderate to severe facet arthropathy and ligamentum flavum  redundancy with trace first facet effusion which is likely reactive. Moderate canal stenosis. Moderate RIGHT, mild to moderate LEFT neural narrowing.  L4-5: Moderate to large broad-based disc bulge. Severe facet arthropathy and ligamentum flavum with 2 mm bilateral facet effusions which are likely reactive. Severe canal stenosis, AP dimension of the spinal canal is 3 mm. Moderate RIGHT, severe LEFT neural foraminal narrowing.  L5-S1: Moderate to large broad-based disc bulge asymmetric to the RIGHT may encroach upon the exited RIGHT L5 nerve. Partial effacement of RIGHT lateral recess may affect the traversing RIGHT S1 nerve. Moderate facet arthropathy and ligamentum flavum redundancy without canal stenosis. Moderate to severe RIGHT, moderate LEFT neural foraminal narrowing.  IMPRESSION: MR THORACIC SPINE IMPRESSION  Postradiation changes of the mid thoracic spine with progressed diffuse osseous metastasis, no pathologic fracture.  Increasing epidural confluent tumor from T8 through T10 resulting in severe canal stenosis and cord compression at T9, without spinal cord edema or syrinx.  MR LUMBAR SPINE IMPRESSION  Diffuse osseous metastasis without pathologic fracture or malalignment. Thickened versus redundant cauda equina, limited assessment for leptomeningeal metastasis on this nonenhanced examination.  Lumbar spondylosis.  Severe canal stenosis at L4-5.  Neural foraminal narrowing L2-3 to L5-S1: Severe on the LEFT at L4-5.  Acute findings discussed with and reconfirmed by Dr.ELLIOTT WENTZ on 01/26/2014 at 10:42 pm.   Electronically Signed   By: Elon Alas   On: 01/26/2014 22:37   Mr Lumbar Spine Wo Contrast  01/26/2014   CLINICAL DATA:  Bilateral lower extremity weakness for a few months, increasing over last 5 days. Severe pain with activity. History of metastatic prostate cancer. History of T7 through T11 radiation for prostate metastasis, completed August 2014.  EXAM: MRI THORACIC AND LUMBAR SPINE  WITHOUT CONTRAST  TECHNIQUE: Multiplanar and multiecho pulse sequences of the thoracic and lumbar spine were obtained without intravenous contrast. The patient did not wish to receive contrast.  COMPARISON:  MRI of the thoracic spine October 11, 2012  FINDINGS: MR THORACIC SPINE FINDINGS  Worsening diffuse osseous metastasis with low signal presumed sclerotic lesions. Lumbar vertebral bodies appear intact. Bright T1 signal within the T7 through T12 vertebral bodies consistent with radiation change. Minimal STIR signal associated with the low signal lesions consistent with bone marrow edema. No pathologic fracture.  Increasing soft tissue/tumor from T 8 through T10, extending into the epidural space (extending 4.3 cm in craniocaudad dimension) resulting in severe canal stenosis at T9, in trefoil configuration, AP dimension of the thecal sac is 5 mm with cord compression. No convincing evidence of spinal cord edema. Tumor extends into the neural foramen resulting in at least moderate neural foraminal narrowing at T9-10. No syrinx. Soft tissue/tumor extends into the prevertebral soft tissues at T9.  Stranding of the RIGHT lung at the level of T8 through T10 may reflect postradiation change, incompletely characterized. Severe paraspinal muscle atrophy.  MR LUMBAR SPINE FINDINGS  Sagittal T2 sequence not obtained.  Lumbar vertebral bodies appear intact and aligned with maintenance of lumbar lordosis. Low signal metastasis an L1, and L2, without pathologic fracture. Severe L5-S1 degenerative disc. Severe subacute to chronic discogenic endplate change at X7-F4, mild to moderate chronic discogenic endplate changes at F4-2, L3-4 and L4-5.  Conus medullaris terminates at L1 and appears normal morphology and signal characteristics. Slightly thickened appearance of the cauda equina, for example axial 38/62 could be accentuated by spinal canal is is an redundancy. Prevertebral soft tissues are nonsuspicious. Moderate to severe  paraspinal muscle atrophy. Ankylosis of the sacroiliac joints.  Level by level evaluation (limited axial axial T2 sequence due to motion):  L1-2: Small broad-based disc bulge. Moderate facet arthropathy and ligamentum flavum redundancy without canal stenosis or neural foraminal narrowing.  L2-3: Small broad-based disc bulge with superimposed small bowel subarticular extra foraminal disc protrusion encroaching upon the exited LEFT L2 nerve. Moderate facet arthropathy and ligamentum flavum redundancy. Mild canal stenosis. Mild RIGHT, mild to moderate LEFT neural foraminal narrowing.  L3: Small broad-based disc bulge eccentric laterally may be 6 L3 nerve. Moderate to severe facet arthropathy and ligamentum flavum redundancy with trace first facet effusion which is likely reactive. Moderate canal stenosis. Moderate RIGHT, mild to moderate LEFT neural narrowing.  L4-5: Moderate to large broad-based  disc bulge. Severe facet arthropathy and ligamentum flavum with 2 mm bilateral facet effusions which are likely reactive. Severe canal stenosis, AP dimension of the spinal canal is 3 mm. Moderate RIGHT, severe LEFT neural foraminal narrowing.  L5-S1: Moderate to large broad-based disc bulge asymmetric to the RIGHT may encroach upon the exited RIGHT L5 nerve. Partial effacement of RIGHT lateral recess may affect the traversing RIGHT S1 nerve. Moderate facet arthropathy and ligamentum flavum redundancy without canal stenosis. Moderate to severe RIGHT, moderate LEFT neural foraminal narrowing.  IMPRESSION: MR THORACIC SPINE IMPRESSION  Postradiation changes of the mid thoracic spine with progressed diffuse osseous metastasis, no pathologic fracture.  Increasing epidural confluent tumor from T8 through T10 resulting in severe canal stenosis and cord compression at T9, without spinal cord edema or syrinx.  MR LUMBAR SPINE IMPRESSION  Diffuse osseous metastasis without pathologic fracture or malalignment. Thickened versus redundant  cauda equina, limited assessment for leptomeningeal metastasis on this nonenhanced examination.  Lumbar spondylosis.  Severe canal stenosis at L4-5.  Neural foraminal narrowing L2-3 to L5-S1: Severe on the LEFT at L4-5.  Acute findings discussed with and reconfirmed by Dr.ELLIOTT WENTZ on 01/26/2014 at 10:42 pm.   Electronically Signed   By: Elon Alas   On: 01/26/2014 22:37     EKG Interpretation None      MDM   Final diagnoses:  Lower extremity weakness  Weakness   Patient is a 73 y.o. Male who presents with bilateral lower extremity weakness.  Physical exam shows symmetric and equal bilateral lower extremity strength with no back fingings.  CBC unremarkable.  CMP unremarkable.  UA shows oxalate crystals and casts with no evidence of UTI.  MRI of the lumbar and thoracic spine reveals epidural tumor at T9 and also severe spinal stenosis from L3-S1.  I spoke with Dr. Marin Olp who believes that the patient requires admission for emergent high dose steroids and will be evaluated with Dr. Alen Blew tomorrow.  I spoke with Dr. Alcario Drought from [facility] who will admit to the hospital and has asked that I speak with neurosurgery.  I spoke with Dr. Rita Ohara from neurosurgery who wants the patient transferred to [facility] so that he can evaluate the patient there and potentially perform surgery today or tomorrow.  Patient to be admitted by [facility] and transferred to [facility].  Patient seen by and discussed with Dr. Eulis Foster who agrees with the above workup and plan.    Cherylann Parr, PA-C 01/27/14 1308  Cherylann Parr, PA-C 01/27/14 [place], MD 01/27/14 1017

## 2014-01-26 NOTE — ED Provider Notes (Signed)
  Face-to-face evaluation   History: he complains of gradually worse weakness in both legs associated with tingling in both legs, and low back pain, for about 36 hours.  He is being managed as an outpatient for prostate cancer with bony metastases.  He is taking hormone injections and a daily chemotherapy agent.  He has had bony radiation for metastases of his spine previously.  Physical exam: patient has mild upper and lower back pain.  He is able to move both legs, while supine on stretcher.  22:40- case discussed with radiologist, Dr. Dorann Lodge, reading his MRI.  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   Medical screening examination/treatment/procedure(s) were conducted as a shared visit with non-physician practitioner(s) and myself.  I personally evaluated the patient during the encounter  Richarda Blade, MD 01/27/14 1016

## 2014-01-26 NOTE — H&P (Signed)
[facility] History and Physical  Clarence Pollard OVF:[id number] DOB: 1940-06-21 DOA: 01/26/2014  Referring physician: EDP PCP: [name], MD   Chief Complaint: BLE weakness   HPI: Clarence Pollard is a 73 y.o. 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  Symptoms onset suddenly on Wed morning and have been progressively worsening.  Described as feeling like his legs are about to give out.  No urinary or bowel symptoms (retention or incontinence).  Does have worsening ascending BLE numbness up to hips.  Spinal cord compression seen on MRI, no spinal cord edema at this time.  Review of Systems: Systems reviewed.  As above, otherwise negative  Past Medical History  Diagnosis Date  . History of radiation therapy 10/14/12-10/25/12    T7-T11   . Prostate cancer   . Metastatic cancer to spine    Past Surgical History  Procedure Laterality Date  . Appendectomy    . Colon surgery    . Cholecystectomy    . Cataract extraction Bilateral    Social History:  reports that he has quit smoking. He does not have any smokeless tobacco history on file. He reports that he does not drink alcohol or use illicit drugs.  No Known Allergies  History reviewed. No pertinent family history.   Prior to Admission medications   Medication Sig Start Date End Date Taking? Authorizing Provider  benztropine (COGENTIN) 0.5 MG tablet Take 0.5 mg by mouth every 12 (twelve) hours.   Yes Historical Provider, MD  bicalutamide (CASODEX) 50 MG tablet Take 50 mg by mouth daily.   Yes Historical Provider, MD  brimonidine (ALPHAGAN P) 0.1 % SOLN Apply 1 drop to eye at bedtime. Both eyes   Yes Historical Provider, MD  Calcium Carbonate-Vitamin D (CALCIUM-VITAMIN D) 500-200 MG-UNIT per tablet Take 1 tablet by mouth 2 (two) times daily with a meal.   Yes Historical Provider, MD  ergocalciferol (VITAMIN D2)  50000 UNITS capsule Take 50,000 Units by mouth once a week. Every Saturday.   Yes Historical Provider, MD  ferrous sulfate 325 (65 FE) MG tablet Take 325 mg by mouth at bedtime.   Yes Historical Provider, MD  ibuprofen (ADVIL,MOTRIN) 600 MG tablet Take 600 mg by mouth 2 (two) times daily as needed for moderate pain (pain).   Yes Historical Provider, MD  OVER THE COUNTER MEDICATION Take by mouth daily. Calcium Citrate 600 mg + Vit D 800 i.u. (2 Tablets daily).   Yes Historical Provider, MD  travoprost, benzalkonium, (TRAVATAN) 0.004 % ophthalmic solution Place 1 drop into both eyes at bedtime.    Yes Historical Provider, MD  bicalutamide (CASODEX) 50 MG tablet TAKE 1 TABLET BY MOUTH EVERY DAY 10/09/13   Wyatt Portela, MD  indomethacin (INDOCIN) 25 MG capsule Take 25 mg by mouth 4 (four) times daily. 1-2 caps    Historical Provider, MD  Magnesium 500 MG CAPS Take 500 mg by mouth daily.    Historical Provider, MD  metroNIDAZOLE (FLAGYL) 500 MG tablet Take 1 tablet by mouth every 6 (six) hours. 10/20/13   Historical Provider, MD  sulfamethoxazole-trimethoprim (BACTRIM DS) 800-160 MG per tablet Take 1 tablet by mouth 2 (two) times daily. 10/20/13   Historical Provider, MD  traZODone (DESYREL) 25 mg TABS tablet Take 0.5 tablets (25 mg total) by mouth at bedtime as needed for sleep. 01/16/14   Wyatt Portela, MD  Physical Exam: Filed Vitals:   01/26/14 2319  BP: 148/64  Pulse: 80  Temp:   Resp: 18    BP 148/64 mmHg  Pulse 80  Temp(Src) 97.5 F (36.4 C) (Oral)  Resp 18  SpO2 95%  General Appearance:    Alert, oriented, no distress, appears stated age  Head:    Normocephalic, atraumatic  Eyes:    PERRL, EOMI, sclera non-icteric        Nose:   Nares without drainage or epistaxis. Mucosa, turbinates normal  Throat:   Moist mucous membranes. Oropharynx without erythema or exudate.  Neck:   Supple. No carotid bruits.  No thyromegaly.  No lymphadenopathy.   Back:     No CVA tenderness, no spinal  tenderness  Lungs:     Clear to auscultation bilaterally, without wheezes, rhonchi or rales  Chest wall:    No tenderness to palpitation  Heart:    Regular rate and rhythm without murmurs, gallops, rubs  Abdomen:     Soft, non-tender, nondistended, normal bowel sounds, no organomegaly  Genitalia:    deferred  Rectal:    deferred  Extremities:   No clubbing, cyanosis or edema.  Pulses:   2+ and symmetric all extremities  Skin:   Skin color, texture, turgor normal, no rashes or lesions  Lymph nodes:   Cervical, supraclavicular, and axillary nodes normal  Neurologic:   CNII-XII intact. BLE 4/5 strength with diminished sensation.    Labs on Admission:  Basic Metabolic Panel:  Recent Labs Lab 01/26/14 1647 01/26/14 1809  NA 141 143  K 3.5* 3.4*  CL 103 107  CO2 23  --   GLUCOSE 92 94  BUN 11 8  CREATININE 0.77 0.80  0.80  CALCIUM 8.8  --    Liver Function Tests:  Recent Labs Lab 01/26/14 1647  AST 22  ALT 14  ALKPHOS 183*  BILITOT 0.5  PROT 9.2*  ALBUMIN 3.9   No results for input(s): LIPASE, AMYLASE in the last 168 hours. No results for input(s): AMMONIA in the last 168 hours. CBC:  Recent Labs Lab 01/26/14 1647 01/26/14 1809  WBC 8.1  --   NEUTROABS 5.4  --   HGB 10.1* 10.2*  HCT 33.2* 30.0*  MCV 79.6  --   PLT 335  --    Cardiac Enzymes: No results for input(s): CKTOTAL, CKMB, CKMBINDEX, TROPONINI in the last 168 hours.  BNP (last 3 results) No results for input(s): PROBNP in the last 8760 hours. CBG: No results for input(s): GLUCAP in the last 168 hours.  Radiological Exams on Admission: Mr Thoracic Spine Wo Contrast  01/26/2014   CLINICAL DATA:  Bilateral lower extremity weakness for a few months, increasing over last 5 days. Severe pain with activity. History of metastatic prostate cancer. History of T7 through T11 radiation for prostate metastasis, completed August 2014.  EXAM: MRI THORACIC AND LUMBAR SPINE WITHOUT CONTRAST  TECHNIQUE: Multiplanar  and multiecho pulse sequences of the thoracic and lumbar spine were obtained without intravenous contrast. The patient did not wish to receive contrast.  COMPARISON:  MRI of the thoracic spine October 11, 2012  FINDINGS: MR THORACIC SPINE FINDINGS  Worsening diffuse osseous metastasis with low signal presumed sclerotic lesions. Lumbar vertebral bodies appear intact. Bright T1 signal within the T7 through T12 vertebral bodies consistent with radiation change. Minimal STIR signal associated with the low signal lesions consistent with bone marrow edema. No pathologic fracture.  Increasing soft tissue/tumor from T 8 through T10, extending  into the epidural space (extending 4.3 cm in craniocaudad dimension) resulting in severe canal stenosis at T9, in trefoil configuration, AP dimension of the thecal sac is 5 mm with cord compression. No convincing evidence of spinal cord edema. Tumor extends into the neural foramen resulting in at least moderate neural foraminal narrowing at T9-10. No syrinx. Soft tissue/tumor extends into the prevertebral soft tissues at T9.  Stranding of the RIGHT lung at the level of T8 through T10 may reflect postradiation change, incompletely characterized. Severe paraspinal muscle atrophy.  MR LUMBAR SPINE FINDINGS  Sagittal T2 sequence not obtained.  Lumbar vertebral bodies appear intact and aligned with maintenance of lumbar lordosis. Low signal metastasis an L1, and L2, without pathologic fracture. Severe L5-S1 degenerative disc. Severe subacute to chronic discogenic endplate change at Q0-G8, mild to moderate chronic discogenic endplate changes at Q7-6, L3-4 and L4-5.  Conus medullaris terminates at L1 and appears normal morphology and signal characteristics. Slightly thickened appearance of the cauda equina, for example axial 38/62 could be accentuated by spinal canal is is an redundancy. Prevertebral soft tissues are nonsuspicious. Moderate to severe paraspinal muscle atrophy. Ankylosis of the  sacroiliac joints.  Level by level evaluation (limited axial axial T2 sequence due to motion):  L1-2: Small broad-based disc bulge. Moderate facet arthropathy and ligamentum flavum redundancy without canal stenosis or neural foraminal narrowing.  L2-3: Small broad-based disc bulge with superimposed small bowel subarticular extra foraminal disc protrusion encroaching upon the exited LEFT L2 nerve. Moderate facet arthropathy and ligamentum flavum redundancy. Mild canal stenosis. Mild RIGHT, mild to moderate LEFT neural foraminal narrowing.  L3: Small broad-based disc bulge eccentric laterally may be 6 L3 nerve. Moderate to severe facet arthropathy and ligamentum flavum redundancy with trace first facet effusion which is likely reactive. Moderate canal stenosis. Moderate RIGHT, mild to moderate LEFT neural narrowing.  L4-5: Moderate to large broad-based disc bulge. Severe facet arthropathy and ligamentum flavum with 2 mm bilateral facet effusions which are likely reactive. Severe canal stenosis, AP dimension of the spinal canal is 3 mm. Moderate RIGHT, severe LEFT neural foraminal narrowing.  L5-S1: Moderate to large broad-based disc bulge asymmetric to the RIGHT may encroach upon the exited RIGHT L5 nerve. Partial effacement of RIGHT lateral recess may affect the traversing RIGHT S1 nerve. Moderate facet arthropathy and ligamentum flavum redundancy without canal stenosis. Moderate to severe RIGHT, moderate LEFT neural foraminal narrowing.  IMPRESSION: MR THORACIC SPINE IMPRESSION  Postradiation changes of the mid thoracic spine with progressed diffuse osseous metastasis, no pathologic fracture.  Increasing epidural confluent tumor from T8 through T10 resulting in severe canal stenosis and cord compression at T9, without spinal cord edema or syrinx.  MR LUMBAR SPINE IMPRESSION  Diffuse osseous metastasis without pathologic fracture or malalignment. Thickened versus redundant cauda equina, limited assessment for  leptomeningeal metastasis on this nonenhanced examination.  Lumbar spondylosis.  Severe canal stenosis at L4-5.  Neural foraminal narrowing L2-3 to L5-S1: Severe on the LEFT at L4-5.  Acute findings discussed with and reconfirmed by Dr.ELLIOTT WENTZ on 01/26/2014 at 10:42 pm.   Electronically Signed   By: Elon Alas   On: 01/26/2014 22:37   Mr Lumbar Spine Wo Contrast  01/26/2014   CLINICAL DATA:  Bilateral lower extremity weakness for a few months, increasing over last 5 days. Severe pain with activity. History of metastatic prostate cancer. History of T7 through T11 radiation for prostate metastasis, completed August 2014.  EXAM: MRI THORACIC AND LUMBAR SPINE WITHOUT CONTRAST  TECHNIQUE: Multiplanar and  multiecho pulse sequences of the thoracic and lumbar spine were obtained without intravenous contrast. The patient did not wish to receive contrast.  COMPARISON:  MRI of the thoracic spine October 11, 2012  FINDINGS: MR THORACIC SPINE FINDINGS  Worsening diffuse osseous metastasis with low signal presumed sclerotic lesions. Lumbar vertebral bodies appear intact. Bright T1 signal within the T7 through T12 vertebral bodies consistent with radiation change. Minimal STIR signal associated with the low signal lesions consistent with bone marrow edema. No pathologic fracture.  Increasing soft tissue/tumor from T 8 through T10, extending into the epidural space (extending 4.3 cm in craniocaudad dimension) resulting in severe canal stenosis at T9, in trefoil configuration, AP dimension of the thecal sac is 5 mm with cord compression. No convincing evidence of spinal cord edema. Tumor extends into the neural foramen resulting in at least moderate neural foraminal narrowing at T9-10. No syrinx. Soft tissue/tumor extends into the prevertebral soft tissues at T9.  Stranding of the RIGHT lung at the level of T8 through T10 may reflect postradiation change, incompletely characterized. Severe paraspinal muscle atrophy.  MR  LUMBAR SPINE FINDINGS  Sagittal T2 sequence not obtained.  Lumbar vertebral bodies appear intact and aligned with maintenance of lumbar lordosis. Low signal metastasis an L1, and L2, without pathologic fracture. Severe L5-S1 degenerative disc. Severe subacute to chronic discogenic endplate change at W8-E3, mild to moderate chronic discogenic endplate changes at O1-2, L3-4 and L4-5.  Conus medullaris terminates at L1 and appears normal morphology and signal characteristics. Slightly thickened appearance of the cauda equina, for example axial 38/62 could be accentuated by spinal canal is is an redundancy. Prevertebral soft tissues are nonsuspicious. Moderate to severe paraspinal muscle atrophy. Ankylosis of the sacroiliac joints.  Level by level evaluation (limited axial axial T2 sequence due to motion):  L1-2: Small broad-based disc bulge. Moderate facet arthropathy and ligamentum flavum redundancy without canal stenosis or neural foraminal narrowing.  L2-3: Small broad-based disc bulge with superimposed small bowel subarticular extra foraminal disc protrusion encroaching upon the exited LEFT L2 nerve. Moderate facet arthropathy and ligamentum flavum redundancy. Mild canal stenosis. Mild RIGHT, mild to moderate LEFT neural foraminal narrowing.  L3: Small broad-based disc bulge eccentric laterally may be 6 L3 nerve. Moderate to severe facet arthropathy and ligamentum flavum redundancy with trace first facet effusion which is likely reactive. Moderate canal stenosis. Moderate RIGHT, mild to moderate LEFT neural narrowing.  L4-5: Moderate to large broad-based disc bulge. Severe facet arthropathy and ligamentum flavum with 2 mm bilateral facet effusions which are likely reactive. Severe canal stenosis, AP dimension of the spinal canal is 3 mm. Moderate RIGHT, severe LEFT neural foraminal narrowing.  L5-S1: Moderate to large broad-based disc bulge asymmetric to the RIGHT may encroach upon the exited RIGHT L5 nerve.  Partial effacement of RIGHT lateral recess may affect the traversing RIGHT S1 nerve. Moderate facet arthropathy and ligamentum flavum redundancy without canal stenosis. Moderate to severe RIGHT, moderate LEFT neural foraminal narrowing.  IMPRESSION: MR THORACIC SPINE IMPRESSION  Postradiation changes of the mid thoracic spine with progressed diffuse osseous metastasis, no pathologic fracture.  Increasing epidural confluent tumor from T8 through T10 resulting in severe canal stenosis and cord compression at T9, without spinal cord edema or syrinx.  MR LUMBAR SPINE IMPRESSION  Diffuse osseous metastasis without pathologic fracture or malalignment. Thickened versus redundant cauda equina, limited assessment for leptomeningeal metastasis on this nonenhanced examination.  Lumbar spondylosis.  Severe canal stenosis at L4-5.  Neural foraminal narrowing L2-3 to L5-S1: Severe  on the LEFT at L4-5.  Acute findings discussed with and reconfirmed by Dr.ELLIOTT WENTZ on 01/26/2014 at 10:42 pm.   Electronically Signed   By: Elon Alas   On: 01/26/2014 22:37    EKG: Independently reviewed.  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 Spinal cord compression from metastatic prostate cancer epidural tumor from T8-T10 causing T9 compression 1. ED spoke with Dr. Jonette Eva, Dr. Alen Blew to consult on patient tomorrow, Dr. Jonette Eva said that his initial impression was that patient likely wouldn't be a great candidate for repeat radiation therapy and that this would probably end up being neurosurgical. 2. Decadron 10mg  IV q6h ordered 3. EDP spoke with Dr. Sherwood Gambler: 1. Admit patient to [facility] 2. NPO (will also only use SCDs for DVT ppx) 3. Prefer 3S16 bed (have put in for SDU level of care) 4. Page Dr. Sherwood Gambler when patient arrives, per EDP he plans to evaluate tonight. 4. Neuro checks ordered.   Code  Status: Full Code  Family Communication: No family in room Disposition Plan: Admit to inpatient   Time spent: 70 min  GARDNER, JARED M. [facility] Pager [phone]  If 7AM-7PM, please contact the day team taking care of the patient [email or web address] Password [email or web address] 01/26/2014, 11:54 PM

## 2014-01-26 NOTE — ED Notes (Addendum)
Pt c/o increasing BLE weakness x a couple months and significant increase x 5 days.  Pain score 9/10 w/ activity.  Hx of bone CA w/ mets to spine and prostate CA.

## 2014-01-27 DIAGNOSIS — Z87891 Personal history of nicotine dependence: Secondary | ICD-10-CM | POA: Diagnosis not present

## 2014-01-27 DIAGNOSIS — C61 Malignant neoplasm of prostate: Secondary | ICD-10-CM | POA: Diagnosis present

## 2014-01-27 DIAGNOSIS — R739 Hyperglycemia, unspecified: Secondary | ICD-10-CM | POA: Diagnosis not present

## 2014-01-27 DIAGNOSIS — G4700 Insomnia, unspecified: Secondary | ICD-10-CM

## 2014-01-27 DIAGNOSIS — D649 Anemia, unspecified: Secondary | ICD-10-CM | POA: Diagnosis present

## 2014-01-27 DIAGNOSIS — E876 Hypokalemia: Secondary | ICD-10-CM | POA: Diagnosis present

## 2014-01-27 DIAGNOSIS — Z6834 Body mass index (BMI) 34.0-34.9, adult: Secondary | ICD-10-CM | POA: Diagnosis not present

## 2014-01-27 DIAGNOSIS — R531 Weakness: Secondary | ICD-10-CM | POA: Diagnosis present

## 2014-01-27 DIAGNOSIS — Z923 Personal history of irradiation: Secondary | ICD-10-CM | POA: Diagnosis not present

## 2014-01-27 DIAGNOSIS — R29898 Other symptoms and signs involving the musculoskeletal system: Secondary | ICD-10-CM | POA: Diagnosis present

## 2014-01-27 DIAGNOSIS — G8220 Paraplegia, unspecified: Secondary | ICD-10-CM | POA: Diagnosis present

## 2014-01-27 DIAGNOSIS — Z79899 Other long term (current) drug therapy: Secondary | ICD-10-CM | POA: Diagnosis not present

## 2014-01-27 DIAGNOSIS — C7951 Secondary malignant neoplasm of bone: Secondary | ICD-10-CM | POA: Diagnosis present

## 2014-01-27 DIAGNOSIS — C7949 Secondary malignant neoplasm of other parts of nervous system: Secondary | ICD-10-CM | POA: Diagnosis present

## 2014-01-27 DIAGNOSIS — T380X5A Adverse effect of glucocorticoids and synthetic analogues, initial encounter: Secondary | ICD-10-CM | POA: Diagnosis not present

## 2014-01-27 DIAGNOSIS — G9529 Other cord compression: Secondary | ICD-10-CM | POA: Diagnosis present

## 2014-01-27 LAB — ABO/RH: ABO/RH(D): O POS

## 2014-01-27 LAB — MRSA PCR SCREENING: MRSA BY PCR: NEGATIVE

## 2014-01-27 MED ORDER — TRAVOPROST (BAK FREE) 0.004 % OP SOLN
1.0000 [drp] | Freq: Every day | OPHTHALMIC | Status: DC
  Filled 2014-01-27: qty 2.5

## 2014-01-27 MED ORDER — BENZTROPINE MESYLATE 0.5 MG PO TABS
0.5000 mg | ORAL_TABLET | Freq: Two times a day (BID) | ORAL | Status: DC
  Administered 2014-01-27 – 2014-02-04 (×16): 0.5 mg via ORAL
  Filled 2014-01-27 (×18): qty 1

## 2014-01-27 MED ORDER — BRIMONIDINE TARTRATE 0.15 % OP SOLN
1.0000 [drp] | Freq: Every day | OPHTHALMIC | Status: DC
  Filled 2014-01-27: qty 5

## 2014-01-27 MED ORDER — BICALUTAMIDE 50 MG PO TABS
50.0000 mg | ORAL_TABLET | Freq: Every day | ORAL | Status: DC
  Administered 2014-01-27 – 2014-02-04 (×9): 50 mg via ORAL
  Filled 2014-01-27 (×10): qty 1

## 2014-01-27 MED ORDER — BACLOFEN 10 MG PO TABS
10.0000 mg | ORAL_TABLET | Freq: Two times a day (BID) | ORAL | Status: DC
  Administered 2014-01-27 – 2014-02-04 (×16): 10 mg via ORAL
  Filled 2014-01-27 (×17): qty 1

## 2014-01-27 MED ORDER — LATANOPROST 0.005 % OP SOLN
1.0000 [drp] | Freq: Every day | OPHTHALMIC | Status: DC
  Administered 2014-01-27 – 2014-02-03 (×8): 1 [drp] via OPHTHALMIC
  Filled 2014-01-27 (×2): qty 2.5

## 2014-01-27 MED ORDER — FERROUS SULFATE 325 (65 FE) MG PO TABS
325.0000 mg | ORAL_TABLET | Freq: Every day | ORAL | Status: DC
  Administered 2014-01-27 – 2014-02-03 (×8): 325 mg via ORAL
  Filled 2014-01-27 (×11): qty 1

## 2014-01-27 MED ORDER — TRAZODONE HCL 50 MG PO TABS
25.0000 mg | ORAL_TABLET | Freq: Every evening | ORAL | Status: DC | PRN
  Administered 2014-01-31: 25 mg via ORAL
  Filled 2014-01-27: qty 1

## 2014-01-27 MED ORDER — BRIMONIDINE TARTRATE 0.2 % OP SOLN
1.0000 [drp] | Freq: Every day | OPHTHALMIC | Status: DC
  Administered 2014-01-28 – 2014-02-03 (×7): 1 [drp] via OPHTHALMIC
  Filled 2014-01-27 (×2): qty 5

## 2014-01-27 MED ORDER — IBUPROFEN 600 MG PO TABS
600.0000 mg | ORAL_TABLET | Freq: Two times a day (BID) | ORAL | Status: DC | PRN
  Filled 2014-01-27: qty 1

## 2014-01-27 NOTE — Consult Note (Signed)
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 Jennette Kettle  Clarence Pollard is an 73 y.o.right-handed white male.  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 Patient has a history of metastatic prostate cancer diagnosed in July 2014. He underwent radiation therapy from T7-T11 under the direction of Dr. Arloa Koh at the [facility]. He has been followed as well by Dr. Beulah Gandy, from [facility], who has prescribed Lupron every 3 months and Xgeva.  He has also been followed by Dr. Zola Button, from medical oncology at the [facility], who has prescribed Casodex 50 mg daily.  Symptomatically the patient's explains that he has had some soreness bilaterally in the hips, thighs, knees, as well as some difficulty walking for a year or 2. He says that he can't walk long distances because of the pain into the hips, thighs, knees. However if he rests, the discomfort goes away. He denies any discomfort with sitting or laying.  However 6 days ago he developed increased pain localized in the lower thoracic region more so on the left than the right, as well as pain that'll radiate around the lower torso, again more so on the left than the right. With this increased pain he felt that he had weakness, and found that he had to walk in his home holding onto furniture or using a cane.  Workup in the emergency room included not only the thoracic spine MRI with the results described above and in the radiology report below, but also lumbar spine MRI, which  revealed multilevel lumbar spondylosis and degenerative disc disease, with resulting mild to moderate multifactorial canal stenosis at L2-3, moderate multifactorial canal stenosis at L3-4, and severe multifactorial canal stenosis at L4-5.  The emergency room staff discussed the case with Dr. Burney Gauze from medical oncology, who was covering for Dr. Alen Blew.  He felt that it was unlikely that there would be further radiation oncology options, and recommended neurosurgical consultation.  Patient is seen in neurosurgical consultation at the request of triad hospitalist service.   Past Medical History:  Past Medical History  Diagnosis Date  . History of radiation therapy 10/14/12-10/25/12    T7-T11   . Prostate cancer   . Metastatic cancer to spine     Past Surgical History:  Past Surgical History  Procedure Laterality Date  . Appendectomy    . Colon surgery    . Cholecystectomy    . Cataract extraction Bilateral     Family History: History reviewed. No pertinent family history.  Social History:  reports that he has quit smoking. He does not have any smokeless tobacco history on file. He reports that he does not drink alcohol or use illicit drugs.  Allergies: No Known Allergies  Medications: I have reviewed the patient's current medications.  ROS:  Notable for those difficulties described in his history of present illness and past medical history, but is otherwise notable only for complaints of mild cramping in the calves recently.  Physical Examination: Patient is a well-developed well-nourished white male in no acute distress. He is resting comfortably in  his hospital bed.  Blood pressure 159/84, pulse 81, temperature 98.4 F (36.9 C), temperature source Oral, resp. rate 18, height 6' (1.829 m), weight 116.3 kg (256 lb 6.3 oz), SpO2 97 %. Lungs:  Clear to auscultation, symmetrical respiratory excursion. Heart:  Regular rate and rhythm, normal S1 and S2, no murmur. Abdomen:  Soft,  nondistended, nontender, bowel sounds present. Extremity:  No clubbing, cyanosis, or edema.  Neurological Examination: Mental Status Examination:  Awake and alert, fully oriented. Speech is fluent. He has good comprehension. Cranial Nerve Examination:  Pupils equal, round, reactive to light. EOMI. Facial sensation intact.  Facial movements symmetrical. Hearing present bilaterally. Palatal movements symmetrical. Shoulder shrug symmetrical. Tongue midline. Motor Examination:  5/5 strength in the upper extremities.  The iliopsoas is 4 minus to 4/5 on the left and 4/5 on the right. Quadriceps, dorsiflexor, EHL, and plantar flexor are 5 bilaterally. Sensory Examination:  Intact to pinprick in the upper extremities, torso, and lower extremities. No sensory level deficit is found. Reflex Examination:   Left quadriceps is 2, right quadriceps is 1-2, gastrocnemius are absent bilaterally. Toes are upgoing bilaterally.   Results for orders placed or performed during the hospital encounter of 01/26/14 (from the past 48 hour(s))  CBC with Differential     Status: Abnormal   Collection Time: 01/26/14  4:47 PM  Result Value Ref Range   WBC 8.1 4.0 - 10.5 K/uL   RBC 4.17 (L) 4.22 - 5.81 MIL/uL   Hemoglobin 10.1 (L) 13.0 - 17.0 g/dL   HCT 33.2 (L) 39.0 - 52.0 %   MCV 79.6 78.0 - 100.0 fL   MCH 24.2 (L) 26.0 - 34.0 pg   MCHC 30.4 30.0 - 36.0 g/dL   RDW 18.2 (H) 11.5 - 15.5 %   Platelets 335 150 - 400 K/uL   Neutrophils Relative % 66 43 - 77 %   Neutro Abs 5.4 1.7 - 7.7 K/uL   Lymphocytes Relative 23 12 - 46 %   Lymphs Abs 1.8 0.7 - 4.0 K/uL   Monocytes Relative 7 3 - 12 %   Monocytes Absolute 0.5 0.1 - 1.0 K/uL   Eosinophils Relative 5 0 - 5 %   Eosinophils Absolute 0.4 0.0 - 0.7 K/uL   Basophils Relative 1 0 - 1 %   Basophils Absolute 0.0 0.0 - 0.1 K/uL  Comprehensive metabolic panel     Status: Abnormal   Collection Time: 01/26/14  4:47 PM  Result Value Ref Range   Sodium 141 137 - 147 mEq/L    Potassium 3.5 (L) 3.7 - 5.3 mEq/L   Chloride 103 96 - 112 mEq/L   CO2 23 19 - 32 mEq/L   Glucose, Bld 92 70 - 99 mg/dL   BUN 11 6 - 23 mg/dL   Creatinine, Ser 0.77 0.50 - 1.35 mg/dL   Calcium 8.8 8.4 - 10.5 mg/dL   Total Protein 9.2 (H) 6.0 - 8.3 g/dL   Albumin 3.9 3.5 - 5.2 g/dL   AST 22 0 - 37 U/L   ALT 14 0 - 53 U/L   Alkaline Phosphatase 183 (H) 39 - 117 U/L   Total Bilirubin 0.5 0.3 - 1.2 mg/dL   GFR calc non Af Amer 89 (L) >90 mL/min   GFR calc Af Amer >90 >90 mL/min    Comment: (NOTE) The eGFR has been calculated using the CKD EPI equation. This calculation has not been validated in all clinical situations. eGFR's persistently <90 mL/min signify possible Chronic Kidney Disease.  Anion gap 15 5 - 15  I-Stat Chem 8, ED     Status: Abnormal   Collection Time: 01/26/14  6:09 PM  Result Value Ref Range   Sodium 143 137 - 147 mEq/L   Potassium 3.4 (L) 3.7 - 5.3 mEq/L   Chloride 107 96 - 112 mEq/L   BUN 8 6 - 23 mg/dL   Creatinine, Ser 0.80 0.50 - 1.35 mg/dL   Glucose, Bld 94 70 - 99 mg/dL   Calcium, Ion 1.06 (L) 1.13 - 1.30 mmol/L   TCO2 23 0 - 100 mmol/L   Hemoglobin 10.2 (L) 13.0 - 17.0 g/dL   HCT 30.0 (L) 39.0 - 52.0 %  I-stat Creatinine, ED     Status: None   Collection Time: 01/26/14  6:09 PM  Result Value Ref Range   Creatinine, Ser 0.80 0.50 - 1.35 mg/dL  Urinalysis, Routine w reflex microscopic     Status: Abnormal   Collection Time: 01/26/14  7:37 PM  Result Value Ref Range   Color, Urine YELLOW YELLOW   APPearance CLOUDY (A) CLEAR   Specific Gravity, Urine 1.020 1.005 - 1.030   pH 5.5 5.0 - 8.0   Glucose, UA NEGATIVE NEGATIVE mg/dL   Hgb urine dipstick TRACE (A) NEGATIVE   Bilirubin Urine SMALL (A) NEGATIVE   Ketones, ur NEGATIVE NEGATIVE mg/dL   Protein, ur NEGATIVE NEGATIVE mg/dL   Urobilinogen, UA 0.2 0.0 - 1.0 mg/dL   Nitrite NEGATIVE NEGATIVE   Leukocytes, UA TRACE (A) NEGATIVE  Urine microscopic-add on     Status: Abnormal   Collection Time:  01/26/14  7:37 PM  Result Value Ref Range   Squamous Epithelial / LPF FEW (A) RARE   WBC, UA 3-6 <3 WBC/hpf   RBC / HPF 11-20 <3 RBC/hpf   Bacteria, UA FEW (A) RARE   Casts GRANULAR CAST (A) NEGATIVE    Comment: HYALINE CASTS GRANULAR CAST    Crystals CA OXALATE CRYSTALS (A) NEGATIVE   Urine-Other MUCOUS PRESENT     Mr Thoracic Spine Wo Contrast  01/26/2014   CLINICAL DATA:  Bilateral lower extremity weakness for a few months, increasing over last 5 days. Severe pain with activity. History of metastatic prostate cancer. History of T7 through T11 radiation for prostate metastasis, completed August 2014.  EXAM: MRI THORACIC AND LUMBAR SPINE WITHOUT CONTRAST  TECHNIQUE: Multiplanar and multiecho pulse sequences of the thoracic and lumbar spine were obtained without intravenous contrast. The patient did not wish to receive contrast.  COMPARISON:  MRI of the thoracic spine October 11, 2012  FINDINGS: MR THORACIC SPINE FINDINGS  Worsening diffuse osseous metastasis with low signal presumed sclerotic lesions. Lumbar vertebral bodies appear intact. Bright T1 signal within the T7 through T12 vertebral bodies consistent with radiation change. Minimal STIR signal associated with the low signal lesions consistent with bone marrow edema. No pathologic fracture.  Increasing soft tissue/tumor from T 8 through T10, extending into the epidural space (extending 4.3 cm in craniocaudad dimension) resulting in severe canal stenosis at T9, in trefoil configuration, AP dimension of the thecal sac is 5 mm with cord compression. No convincing evidence of spinal cord edema. Tumor extends into the neural foramen resulting in at least moderate neural foraminal narrowing at T9-10. No syrinx. Soft tissue/tumor extends into the prevertebral soft tissues at T9.  Stranding of the RIGHT lung at the level of T8 through T10 may reflect postradiation change, incompletely characterized. Severe paraspinal muscle atrophy.  MR LUMBAR SPINE  FINDINGS  Sagittal  T2 sequence not obtained.  Lumbar vertebral bodies appear intact and aligned with maintenance of lumbar lordosis. Low signal metastasis an L1, and L2, without pathologic fracture. Severe L5-S1 degenerative disc. Severe subacute to chronic discogenic endplate change at I7-O6, mild to moderate chronic discogenic endplate changes at V6-7, L3-4 and L4-5.  Conus medullaris terminates at L1 and appears normal morphology and signal characteristics. Slightly thickened appearance of the cauda equina, for example axial 38/62 could be accentuated by spinal canal is is an redundancy. Prevertebral soft tissues are nonsuspicious. Moderate to severe paraspinal muscle atrophy. Ankylosis of the sacroiliac joints.  Level by level evaluation (limited axial axial T2 sequence due to motion):  L1-2: Small broad-based disc bulge. Moderate facet arthropathy and ligamentum flavum redundancy without canal stenosis or neural foraminal narrowing.  L2-3: Small broad-based disc bulge with superimposed small bowel subarticular extra foraminal disc protrusion encroaching upon the exited LEFT L2 nerve. Moderate facet arthropathy and ligamentum flavum redundancy. Mild canal stenosis. Mild RIGHT, mild to moderate LEFT neural foraminal narrowing.  L3: Small broad-based disc bulge eccentric laterally may be 6 L3 nerve. Moderate to severe facet arthropathy and ligamentum flavum redundancy with trace first facet effusion which is likely reactive. Moderate canal stenosis. Moderate RIGHT, mild to moderate LEFT neural narrowing.  L4-5: Moderate to large broad-based disc bulge. Severe facet arthropathy and ligamentum flavum with 2 mm bilateral facet effusions which are likely reactive. Severe canal stenosis, AP dimension of the spinal canal is 3 mm. Moderate RIGHT, severe LEFT neural foraminal narrowing.  L5-S1: Moderate to large broad-based disc bulge asymmetric to the RIGHT may encroach upon the exited RIGHT L5 nerve. Partial effacement  of RIGHT lateral recess may affect the traversing RIGHT S1 nerve. Moderate facet arthropathy and ligamentum flavum redundancy without canal stenosis. Moderate to severe RIGHT, moderate LEFT neural foraminal narrowing.  IMPRESSION: MR THORACIC SPINE IMPRESSION  Postradiation changes of the mid thoracic spine with progressed diffuse osseous metastasis, no pathologic fracture.  Increasing epidural confluent tumor from T8 through T10 resulting in severe canal stenosis and cord compression at T9, without spinal cord edema or syrinx.  MR LUMBAR SPINE IMPRESSION  Diffuse osseous metastasis without pathologic fracture or malalignment. Thickened versus redundant cauda equina, limited assessment for leptomeningeal metastasis on this nonenhanced examination.  Lumbar spondylosis.  Severe canal stenosis at L4-5.  Neural foraminal narrowing L2-3 to L5-S1: Severe on the LEFT at L4-5.  Acute findings discussed with and reconfirmed by Dr.ELLIOTT WENTZ on 01/26/2014 at 10:42 pm.   Electronically Signed   By: Elon Alas   On: 01/26/2014 22:37   Mr Lumbar Spine Wo Contrast  01/26/2014   CLINICAL DATA:  Bilateral lower extremity weakness for a few months, increasing over last 5 days. Severe pain with activity. History of metastatic prostate cancer. History of T7 through T11 radiation for prostate metastasis, completed August 2014.  EXAM: MRI THORACIC AND LUMBAR SPINE WITHOUT CONTRAST  TECHNIQUE: Multiplanar and multiecho pulse sequences of the thoracic and lumbar spine were obtained without intravenous contrast. The patient did not wish to receive contrast.  COMPARISON:  MRI of the thoracic spine October 11, 2012  FINDINGS: MR THORACIC SPINE FINDINGS  Worsening diffuse osseous metastasis with low signal presumed sclerotic lesions. Lumbar vertebral bodies appear intact. Bright T1 signal within the T7 through T12 vertebral bodies consistent with radiation change. Minimal STIR signal associated with the low signal lesions consistent  with bone marrow edema. No pathologic fracture.  Increasing soft tissue/tumor from T 8 through T10, extending into  the epidural space (extending 4.3 cm in craniocaudad dimension) resulting in severe canal stenosis at T9, in trefoil configuration, AP dimension of the thecal sac is 5 mm with cord compression. No convincing evidence of spinal cord edema. Tumor extends into the neural foramen resulting in at least moderate neural foraminal narrowing at T9-10. No syrinx. Soft tissue/tumor extends into the prevertebral soft tissues at T9.  Stranding of the RIGHT lung at the level of T8 through T10 may reflect postradiation change, incompletely characterized. Severe paraspinal muscle atrophy.  MR LUMBAR SPINE FINDINGS  Sagittal T2 sequence not obtained.  Lumbar vertebral bodies appear intact and aligned with maintenance of lumbar lordosis. Low signal metastasis an L1, and L2, without pathologic fracture. Severe L5-S1 degenerative disc. Severe subacute to chronic discogenic endplate change at A0-T6, mild to moderate chronic discogenic endplate changes at A2-6, L3-4 and L4-5.  Conus medullaris terminates at L1 and appears normal morphology and signal characteristics. Slightly thickened appearance of the cauda equina, for example axial 38/62 could be accentuated by spinal canal is is an redundancy. Prevertebral soft tissues are nonsuspicious. Moderate to severe paraspinal muscle atrophy. Ankylosis of the sacroiliac joints.  Level by level evaluation (limited axial axial T2 sequence due to motion):  L1-2: Small broad-based disc bulge. Moderate facet arthropathy and ligamentum flavum redundancy without canal stenosis or neural foraminal narrowing.  L2-3: Small broad-based disc bulge with superimposed small bowel subarticular extra foraminal disc protrusion encroaching upon the exited LEFT L2 nerve. Moderate facet arthropathy and ligamentum flavum redundancy. Mild canal stenosis. Mild RIGHT, mild to moderate LEFT neural  foraminal narrowing.  L3: Small broad-based disc bulge eccentric laterally may be 6 L3 nerve. Moderate to severe facet arthropathy and ligamentum flavum redundancy with trace first facet effusion which is likely reactive. Moderate canal stenosis. Moderate RIGHT, mild to moderate LEFT neural narrowing.  L4-5: Moderate to large broad-based disc bulge. Severe facet arthropathy and ligamentum flavum with 2 mm bilateral facet effusions which are likely reactive. Severe canal stenosis, AP dimension of the spinal canal is 3 mm. Moderate RIGHT, severe LEFT neural foraminal narrowing.  L5-S1: Moderate to large broad-based disc bulge asymmetric to the RIGHT may encroach upon the exited RIGHT L5 nerve. Partial effacement of RIGHT lateral recess may affect the traversing RIGHT S1 nerve. Moderate facet arthropathy and ligamentum flavum redundancy without canal stenosis. Moderate to severe RIGHT, moderate LEFT neural foraminal narrowing.  IMPRESSION: MR THORACIC SPINE IMPRESSION  Postradiation changes of the mid thoracic spine with progressed diffuse osseous metastasis, no pathologic fracture.  Increasing epidural confluent tumor from T8 through T10 resulting in severe canal stenosis and cord compression at T9, without spinal cord edema or syrinx.  MR LUMBAR SPINE IMPRESSION  Diffuse osseous metastasis without pathologic fracture or malalignment. Thickened versus redundant cauda equina, limited assessment for leptomeningeal metastasis on this nonenhanced examination.  Lumbar spondylosis.  Severe canal stenosis at L4-5.  Neural foraminal narrowing L2-3 to L5-S1: Severe on the LEFT at L4-5.  Acute findings discussed with and reconfirmed by Dr.ELLIOTT WENTZ on 01/26/2014 at 10:42 pm.   Electronically Signed   By: Elon Alas   On: 01/26/2014 22:37     Assessment/Plan: 1)  History of metastatic prostate cancer, diagnosed in July 2014, with radiation therapy by Dr. Arloa Koh, to T7-T11.  Now with a six-day history of  lower thoracic pain, left greater than right, radially around the lower torso, again left greater than right. Examination shows weakness of the iliopsoas bilaterally, left greater than right, as well  as upgoing toes bilaterally. There is no sensory deficit on examination. MRI scan of the thoracic spine shows epidural tumor from T8-T10 with spinal canal stenosis and spinal cord compression.  Patient's been started on Decadron 10 mg IV every 6 hours.  2)  History of neurogenic claudication symptoms, with soreness into the hips, thighs, and knees with activity, relieved by rest for the past year or 2. MRI of the lumbar spine shows extensive lumbar spondylosis and degenerative disease, with resulting multilevel, multifactorial lumbar stenosis, mild to moderate at L2-3, moderate at L3-4, and severe at L4-5.  I spoke with the patient about his condition and the MRI findings, and reviewed the actual MRI images with him with the bedside computer. Certainly the thoracic spinal canal stenosis and spinal cord compression are the more immediate problem, and most likely responsible for the symptoms that began 6 days ago, including lower thoracic and radiating lower torso pain, and proximal lower extremity weakness.  However he clearly has had some ongoing symptoms of neurogenic claudication due to the lumbar stenosis.  I have recommended thoracic laminectomy for resection of tumor, and decompression of the thoracic spinal canal and spinal cord.  Ultimately he will also require a decompressive lumbar laminectomy for decompression of the lumbar stenosis.  However I feel that it'll be best for him to undergo the more immediately needed thoracic surgery and recover from that, and then he can undergo the more elective lumbar surgery.  I discussed the nature of his condition and the nature of the thoracic laminectomy for tumor resection. We discussed tip of the surgery, hospital stay, and recuperation. We discussed risks of  surgery including risks of infection, bleeding, possibly for transfusion, the risk of increased neurologic dysfunction including paralysis, the risk of dural tear and CSF leakage and possibly further surgery, and anesthetic risks of myocardial infarction, stroke, pneumonia, and death. I also discussed that he will need further medical oncologic and possibly radiation oncologic treatment, since the surgical procedure will certainly not remove all of his tumor. The patient's questions regarding his situation and condition were answered in detail for him.  The patient does want to proceed with surgery, but he does want me to have an opportunity discuss the situation with his daughter who will be returning later today.  In the meantime we'll have the patient resume his diet, we will start him on baclofen 10 mg by mouth twice a day for the cramping in his legs, and we'll have nursing staff assist him with mobilization to chair and with ambulation.   Hosie Spangle, MD 01/27/2014, 3:35 AM

## 2014-01-27 NOTE — Progress Notes (Signed)
[place] TEAM 1 - Stepdown/ICU TEAM Progress Note  Clarence Pollard WUJ:[id number] DOB: 12-Apr-1940 DOA: 01/26/2014 PCP: [name], MD  Admit HPI / Brief Narrative: Clarence Pollard is a 73 y.o. 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  S/P XRT  to the thoracic spine between T7 and T8-28 October 2012. S/P Treatment with Casodex and Lupron.   Presents to ED with BLE cramping and weakness for past 6 days. Symptoms onset suddenly on Wed morning and have been progressively worsening. Described as feeling like his legs are about to give out. No urinary or bowel symptoms (retention or incontinence). Does have worsening ascending BLE numbness up to hips.  Spinal cord compression seen on MRI, no spinal cord edema at this time.   HPI/Subjective: 11/10 patient sitting comfortably in chair, states up until about a week ago was able to ambulate from car to house, around inside house, without any aid. Then began having difficulty ambulating and would need to grab furniture or use a walker. Has some lower back pain, but no encopresis/encoresis. States while sitting in the chair negative pain in his back.  Assessment/Plan: Spinal cord compression -Secondary to metastatic prostate cancer -patient will have neurosurgical debulking of tumor on 11/11. Per notes does not appear to be candidate for further radiation treatment. -Awaiting postoperative recommendations from oncology. -obtain CMP, INR, magnesium for the a.m. -Continue Decadron 10 mg QID  Insomnia -Continue trazodone 25 mg QHS    Code Status: FULL Family Communication: family present at time of exam Disposition Plan: per neurosurgery, oncology   Consultants: Dr. Burney Gauze (oncology) Dr. Jovita Gamma (neurosurgery)  Procedure/Significant Events: 11/9 MRI T-spine/L-spine;Worsening diffuse osseous metastasis with low signal presumed sclerotic lesions. -Increasing soft tissue/tumor from T 8 through T10, extending  into the epidural space (extending 4.3 cm in craniocaudad dimension);resulting in severe canal stenosis at T9,  -thecal sac is 5 mm with cord compression. No spinal cord edema.  -Tumor extends into neural foramen resulting in moderate neural foraminal narrowing at T9-10. -Soft tissue/tumor extends into prevertebral soft tissues at T9. -Stranding of the RIGHT lung at the level of T8 through T10 may reflect postradiation change,  -Severe paraspinal muscle atrophy.  -L2-3: Small broad-based disc bulge with superimposed small bowel subarticular extra foraminal disc protrusion encroaching upon the exited LEFT L2 nerve.  -. Mild RIGHT, mild to moderate LEFT neural foraminal narrowing. -L3: Moderate RIGHT, mild to moderate LEFTneural narrowing. -L4-5: Moderate to large broad-based disc bulge. 2 mm bilateral facet effusions which are likely reactive.  -Severe canal stenosis,  Moderate RIGHT, severe LEFT neural foraminal narrowing. -L5-S1: Moderate to large broad-based disc bulge may encroach upon exited RIGHT L5 nerve.  -Partial effacement of RIGHT lateral recess may affect traversing RIGHT S1 nerve. -Moderate to severe RIGHT,moderate LEFT neural foraminal narrowing.     Culture NA  Antibiotics: NA  DVT prophylaxis: SCD   Devices NA   LINES / TUBES:  NA    Continuous Infusions:   Objective: VITAL SIGNS: Temp: 98 F (36.7 C) (11/10 1638) Temp Source: Oral (11/10 1638) BP: 151/93 mmHg (11/10 0740) Pulse Rate: 96 (11/10 0740) SPO2; FIO2:   Intake/Output Summary (Last 24 hours) at 01/27/14 1647 Last data filed at 01/27/14 1257  Gross per 24 hour  Intake      0 ml  Output   1226 ml  Net  -1226 ml     Exam: General: A/O 4, NAD,No acute respiratory distress Lungs: Clear to auscultation bilaterally without wheezes or crackles  Cardiovascular: Regular rate and rhythm without murmur gallop or rub normal S1 and S2 Abdomen: Nontender, nondistended, soft, bowel sounds  positive, no rebound, no ascites, no appreciable mass Extremities: No significant cyanosis, clubbing, or edema bilateral lower extremities Musculoskeletal; muscle spasm paraspinal muscles lumbar area left side, negative midline tenderness  Data Reviewed: Basic Metabolic Panel:  Recent Labs Lab 01/26/14 1647 01/26/14 1809  NA 141 143  K 3.5* 3.4*  CL 103 107  CO2 23  --   GLUCOSE 92 94  BUN 11 8  CREATININE 0.77 0.80  0.80  CALCIUM 8.8  --    Liver Function Tests:  Recent Labs Lab 01/26/14 1647  AST 22  ALT 14  ALKPHOS 183*  BILITOT 0.5  PROT 9.2*  ALBUMIN 3.9   No results for input(s): LIPASE, AMYLASE in the last 168 hours. No results for input(s): AMMONIA in the last 168 hours. CBC:  Recent Labs Lab 01/26/14 1647 01/26/14 1809  WBC 8.1  --   NEUTROABS 5.4  --   HGB 10.1* 10.2*  HCT 33.2* 30.0*  MCV 79.6  --   PLT 335  --    Cardiac Enzymes: No results for input(s): CKTOTAL, CKMB, CKMBINDEX, TROPONINI in the last 168 hours. BNP (last 3 results) No results for input(s): PROBNP in the last 8760 hours. CBG: No results for input(s): GLUCAP in the last 168 hours.  Recent Results (from the past 240 hour(s))  MRSA PCR Screening     Status: None   Collection Time: 01/27/14  2:39 AM  Result Value Ref Range Status   MRSA by PCR NEGATIVE NEGATIVE Final    Comment:        The GeneXpert MRSA Assay (FDA approved for NASAL specimens only), is one component of a comprehensive MRSA colonization surveillance program. It is not intended to diagnose MRSA infection nor to guide or monitor treatment for MRSA infections.      Studies:  Recent x-ray studies have been reviewed in detail by the Attending Physician  Scheduled Meds:  Scheduled Meds: . baclofen  10 mg Oral BID  . benztropine  0.5 mg Oral Q12H  . bicalutamide  50 mg Oral Daily  . brimonidine  1 drop Both Eyes QHS  . dexamethasone  10 mg Intravenous 4 times per day  . ferrous sulfate  325 mg Oral  QHS  . latanoprost  1 drop Both Eyes QHS    Time spent on care of this patient: 40 mins   Allie Bossier , MD   [facility] Office  [phone] Pager [phone]  On-Call/Text Page:      [email or web address]      password [email or web address]  If 7PM-7AM, please contact night-coverage [email or web address] Password [email or web address] 01/27/2014, 4:47 PM   LOS: 1 day

## 2014-01-27 NOTE — Plan of Care (Signed)
Problem: Phase I Progression Outcomes Goal: Pain controlled with appropriate interventions Outcome: Progressing Goal: OOB as tolerated unless otherwise ordered Outcome: Progressing Goal: Voiding-avoid urinary catheter unless indicated Outcome: Completed/Met Date Met:  01/27/14 Goal: Hemodynamically stable Outcome: Completed/Met Date Met:  01/27/14

## 2014-01-27 NOTE — Progress Notes (Signed)
Utilization review completed.  

## 2014-01-28 ENCOUNTER — Inpatient Hospital Stay (HOSPITAL_COMMUNITY): Payer: Medicare Other

## 2014-01-28 LAB — GLUCOSE, CAPILLARY: GLUCOSE-CAPILLARY: 207 mg/dL — AB (ref 70–99)

## 2014-01-28 LAB — BASIC METABOLIC PANEL
Anion gap: 15 (ref 5–15)
BUN: 16 mg/dL (ref 6–23)
CHLORIDE: 100 meq/L (ref 96–112)
CO2: 25 mEq/L (ref 19–32)
CREATININE: 0.72 mg/dL (ref 0.50–1.35)
Calcium: 8.5 mg/dL (ref 8.4–10.5)
Glucose, Bld: 183 mg/dL — ABNORMAL HIGH (ref 70–99)
Potassium: 3.6 mEq/L — ABNORMAL LOW (ref 3.7–5.3)
Sodium: 140 mEq/L (ref 137–147)

## 2014-01-28 MED ORDER — LORAZEPAM 2 MG/ML IJ SOLN
1.0000 mg | Freq: Once | INTRAMUSCULAR | Status: AC
Start: 2014-01-28 — End: 2014-01-28
  Administered 2014-01-28: 1 mg via INTRAVENOUS
  Filled 2014-01-28: qty 1

## 2014-01-28 MED ORDER — GADOBENATE DIMEGLUMINE 529 MG/ML IV SOLN
20.0000 mL | Freq: Once | INTRAVENOUS | Status: AC | PRN
  Administered 2014-01-28: 20 mL via INTRAVENOUS

## 2014-01-28 MED ORDER — PANTOPRAZOLE SODIUM 40 MG PO TBEC
40.0000 mg | DELAYED_RELEASE_TABLET | Freq: Every day | ORAL | Status: DC
  Administered 2014-01-30 – 2014-02-04 (×6): 40 mg via ORAL
  Filled 2014-01-28 (×6): qty 1

## 2014-01-28 MED ORDER — POTASSIUM CHLORIDE CRYS ER 20 MEQ PO TBCR
40.0000 meq | EXTENDED_RELEASE_TABLET | Freq: Once | ORAL | Status: AC
  Administered 2014-01-28: 40 meq via ORAL
  Filled 2014-01-28: qty 2

## 2014-01-28 MED ORDER — MORPHINE SULFATE 2 MG/ML IJ SOLN: 1.0000 mg | INTRAMUSCULAR | Status: DC | PRN

## 2014-01-28 MED ORDER — INSULIN ASPART 100 UNIT/ML ~~LOC~~ SOLN
0.0000 [IU] | Freq: Three times a day (TID) | SUBCUTANEOUS | Status: DC
  Administered 2014-01-29: 5 [IU] via SUBCUTANEOUS
  Administered 2014-01-29 – 2014-01-30 (×2): 2 [IU] via SUBCUTANEOUS
  Administered 2014-01-30: 3 [IU] via SUBCUTANEOUS
  Administered 2014-01-30: 2 [IU] via SUBCUTANEOUS
  Administered 2014-01-31: 1 [IU] via SUBCUTANEOUS
  Administered 2014-01-31: 2 [IU] via SUBCUTANEOUS
  Administered 2014-01-31: 3 [IU] via SUBCUTANEOUS
  Administered 2014-02-01 – 2014-02-02 (×4): 2 [IU] via SUBCUTANEOUS
  Administered 2014-02-02: 1 [IU] via SUBCUTANEOUS
  Administered 2014-02-02: 5 [IU] via SUBCUTANEOUS
  Administered 2014-02-03: 1 [IU] via SUBCUTANEOUS
  Administered 2014-02-03: 2 [IU] via SUBCUTANEOUS
  Administered 2014-02-03: 7 [IU] via SUBCUTANEOUS
  Administered 2014-02-04 (×2): 1 [IU] via SUBCUTANEOUS

## 2014-01-28 NOTE — Progress Notes (Signed)
Subjective: Patient seen earlier this morning with his daughter Amy.  Patient resting in bed, denies pain or discomfort. Has been out of bed to chair, but did not ambulate yesterday.  Objective: Vital signs in last 24 hours: Filed Vitals:   01/28/14 0436 01/28/14 0700 01/28/14 0757 01/28/14 1100  BP: 154/59  142/86   Pulse: 85  90   Temp: 97.9 F (36.6 C) 98 F (36.7 C)  97.6 F (36.4 C)  TempSrc: Oral Oral  Oral  Resp: 19  19   Height:      Weight:      SpO2: 96%  96%     Intake/Output from previous day: 11/10 0701 - 11/11 0700 In: -  Out: 2626 [Urine:2625; Stool:1] Intake/Output this shift: Total I/O In: -  Out: 400 [Urine:400]  Physical Exam:  Awake alert, oriented. Moving all 4 x-rays well, but still some proximal lower extremity weakness, although stronger than yesterday's exam at the time of consultation. The iliopsoas are 4+/5 bilaterally, although the intensity of the strength is better on the right than the left.  CBC  Recent Labs  01/26/14 1647 01/26/14 1809  WBC 8.1  --   HGB 10.1* 10.2*  HCT 33.2* 30.0*  PLT 335  --    BMET  Recent Labs  01/26/14 1647 01/26/14 1809  NA 141 143  K 3.5* 3.4*  CL 103 107  CO2 23  --   GLUCOSE 92 94  BUN 11 8  CREATININE 0.77 0.80  0.80  CALCIUM 8.8  --     Studies/Results: Mr Thoracic Spine Wo Contrast  01/26/2014   CLINICAL DATA:  Bilateral lower extremity weakness for a few months, increasing over last 5 days. Severe pain with activity. History of metastatic prostate cancer. History of T7 through T11 radiation for prostate metastasis, completed August 2014.  EXAM: MRI THORACIC AND LUMBAR SPINE WITHOUT CONTRAST  TECHNIQUE: Multiplanar and multiecho pulse sequences of the thoracic and lumbar spine were obtained without intravenous contrast. The patient did not wish to receive contrast.  COMPARISON:  MRI of the thoracic spine October 11, 2012  FINDINGS: MR THORACIC SPINE FINDINGS  Worsening diffuse osseous metastasis  with low signal presumed sclerotic lesions. Lumbar vertebral bodies appear intact. Bright T1 signal within the T7 through T12 vertebral bodies consistent with radiation change. Minimal STIR signal associated with the low signal lesions consistent with bone marrow edema. No pathologic fracture.  Increasing soft tissue/tumor from T 8 through T10, extending into the epidural space (extending 4.3 cm in craniocaudad dimension) resulting in severe canal stenosis at T9, in trefoil configuration, AP dimension of the thecal sac is 5 mm with cord compression. No convincing evidence of spinal cord edema. Tumor extends into the neural foramen resulting in at least moderate neural foraminal narrowing at T9-10. No syrinx. Soft tissue/tumor extends into the prevertebral soft tissues at T9.  Stranding of the RIGHT lung at the level of T8 through T10 may reflect postradiation change, incompletely characterized. Severe paraspinal muscle atrophy.  MR LUMBAR SPINE FINDINGS  Sagittal T2 sequence not obtained.  Lumbar vertebral bodies appear intact and aligned with maintenance of lumbar lordosis. Low signal metastasis an L1, and L2, without pathologic fracture. Severe L5-S1 degenerative disc. Severe subacute to chronic discogenic endplate change at K4-Y1, mild to moderate chronic discogenic endplate changes at E5-6, L3-4 and L4-5.  Conus medullaris terminates at L1 and appears normal morphology and signal characteristics. Slightly thickened appearance of the cauda equina, for example axial 38/62 could be  accentuated by spinal canal is is an redundancy. Prevertebral soft tissues are nonsuspicious. Moderate to severe paraspinal muscle atrophy. Ankylosis of the sacroiliac joints.  Level by level evaluation (limited axial axial T2 sequence due to motion):  L1-2: Small broad-based disc bulge. Moderate facet arthropathy and ligamentum flavum redundancy without canal stenosis or neural foraminal narrowing.  L2-3: Small broad-based disc bulge  with superimposed small bowel subarticular extra foraminal disc protrusion encroaching upon the exited LEFT L2 nerve. Moderate facet arthropathy and ligamentum flavum redundancy. Mild canal stenosis. Mild RIGHT, mild to moderate LEFT neural foraminal narrowing.  L3: Small broad-based disc bulge eccentric laterally may be 6 L3 nerve. Moderate to severe facet arthropathy and ligamentum flavum redundancy with trace first facet effusion which is likely reactive. Moderate canal stenosis. Moderate RIGHT, mild to moderate LEFT neural narrowing.  L4-5: Moderate to large broad-based disc bulge. Severe facet arthropathy and ligamentum flavum with 2 mm bilateral facet effusions which are likely reactive. Severe canal stenosis, AP dimension of the spinal canal is 3 mm. Moderate RIGHT, severe LEFT neural foraminal narrowing.  L5-S1: Moderate to large broad-based disc bulge asymmetric to the RIGHT may encroach upon the exited RIGHT L5 nerve. Partial effacement of RIGHT lateral recess may affect the traversing RIGHT S1 nerve. Moderate facet arthropathy and ligamentum flavum redundancy without canal stenosis. Moderate to severe RIGHT, moderate LEFT neural foraminal narrowing.  IMPRESSION: MR THORACIC SPINE IMPRESSION  Postradiation changes of the mid thoracic spine with progressed diffuse osseous metastasis, no pathologic fracture.  Increasing epidural confluent tumor from T8 through T10 resulting in severe canal stenosis and cord compression at T9, without spinal cord edema or syrinx.  MR LUMBAR SPINE IMPRESSION  Diffuse osseous metastasis without pathologic fracture or malalignment. Thickened versus redundant cauda equina, limited assessment for leptomeningeal metastasis on this nonenhanced examination.  Lumbar spondylosis.  Severe canal stenosis at L4-5.  Neural foraminal narrowing L2-3 to L5-S1: Severe on the LEFT at L4-5.  Acute findings discussed with and reconfirmed by Dr.ELLIOTT WENTZ on 01/26/2014 at 10:42 pm.    Electronically Signed   By: Elon Alas   On: 01/26/2014 22:37   Mr Lumbar Spine Wo Contrast  01/26/2014   CLINICAL DATA:  Bilateral lower extremity weakness for a few months, increasing over last 5 days. Severe pain with activity. History of metastatic prostate cancer. History of T7 through T11 radiation for prostate metastasis, completed August 2014.  EXAM: MRI THORACIC AND LUMBAR SPINE WITHOUT CONTRAST  TECHNIQUE: Multiplanar and multiecho pulse sequences of the thoracic and lumbar spine were obtained without intravenous contrast. The patient did not wish to receive contrast.  COMPARISON:  MRI of the thoracic spine October 11, 2012  FINDINGS: MR THORACIC SPINE FINDINGS  Worsening diffuse osseous metastasis with low signal presumed sclerotic lesions. Lumbar vertebral bodies appear intact. Bright T1 signal within the T7 through T12 vertebral bodies consistent with radiation change. Minimal STIR signal associated with the low signal lesions consistent with bone marrow edema. No pathologic fracture.  Increasing soft tissue/tumor from T 8 through T10, extending into the epidural space (extending 4.3 cm in craniocaudad dimension) resulting in severe canal stenosis at T9, in trefoil configuration, AP dimension of the thecal sac is 5 mm with cord compression. No convincing evidence of spinal cord edema. Tumor extends into the neural foramen resulting in at least moderate neural foraminal narrowing at T9-10. No syrinx. Soft tissue/tumor extends into the prevertebral soft tissues at T9.  Stranding of the RIGHT lung at the level of T8 through  T10 may reflect postradiation change, incompletely characterized. Severe paraspinal muscle atrophy.  MR LUMBAR SPINE FINDINGS  Sagittal T2 sequence not obtained.  Lumbar vertebral bodies appear intact and aligned with maintenance of lumbar lordosis. Low signal metastasis an L1, and L2, without pathologic fracture. Severe L5-S1 degenerative disc. Severe subacute to chronic  discogenic endplate change at Q4-B2, mild to moderate chronic discogenic endplate changes at E1-0, L3-4 and L4-5.  Conus medullaris terminates at L1 and appears normal morphology and signal characteristics. Slightly thickened appearance of the cauda equina, for example axial 38/62 could be accentuated by spinal canal is is an redundancy. Prevertebral soft tissues are nonsuspicious. Moderate to severe paraspinal muscle atrophy. Ankylosis of the sacroiliac joints.  Level by level evaluation (limited axial axial T2 sequence due to motion):  L1-2: Small broad-based disc bulge. Moderate facet arthropathy and ligamentum flavum redundancy without canal stenosis or neural foraminal narrowing.  L2-3: Small broad-based disc bulge with superimposed small bowel subarticular extra foraminal disc protrusion encroaching upon the exited LEFT L2 nerve. Moderate facet arthropathy and ligamentum flavum redundancy. Mild canal stenosis. Mild RIGHT, mild to moderate LEFT neural foraminal narrowing.  L3: Small broad-based disc bulge eccentric laterally may be 6 L3 nerve. Moderate to severe facet arthropathy and ligamentum flavum redundancy with trace first facet effusion which is likely reactive. Moderate canal stenosis. Moderate RIGHT, mild to moderate LEFT neural narrowing.  L4-5: Moderate to large broad-based disc bulge. Severe facet arthropathy and ligamentum flavum with 2 mm bilateral facet effusions which are likely reactive. Severe canal stenosis, AP dimension of the spinal canal is 3 mm. Moderate RIGHT, severe LEFT neural foraminal narrowing.  L5-S1: Moderate to large broad-based disc bulge asymmetric to the RIGHT may encroach upon the exited RIGHT L5 nerve. Partial effacement of RIGHT lateral recess may affect the traversing RIGHT S1 nerve. Moderate facet arthropathy and ligamentum flavum redundancy without canal stenosis. Moderate to severe RIGHT, moderate LEFT neural foraminal narrowing.  IMPRESSION: MR THORACIC SPINE  IMPRESSION  Postradiation changes of the mid thoracic spine with progressed diffuse osseous metastasis, no pathologic fracture.  Increasing epidural confluent tumor from T8 through T10 resulting in severe canal stenosis and cord compression at T9, without spinal cord edema or syrinx.  MR LUMBAR SPINE IMPRESSION  Diffuse osseous metastasis without pathologic fracture or malalignment. Thickened versus redundant cauda equina, limited assessment for leptomeningeal metastasis on this nonenhanced examination.  Lumbar spondylosis.  Severe canal stenosis at L4-5.  Neural foraminal narrowing L2-3 to L5-S1: Severe on the LEFT at L4-5.  Acute findings discussed with and reconfirmed by Dr.ELLIOTT WENTZ on 01/26/2014 at 10:42 pm.   Electronically Signed   By: Elon Alas   On: 01/26/2014 22:37    Assessment/Plan: Spoke with the patient and his daughter at length at the bedside, reviewed his thoracic and lumbar MRIs with them. We discussed the malignant changes and spinal cord compression in the thoracic spine and the degenerative changes and severe stenosis in the lumbar spine.  I have recommended thoracic laminectomy and resection of the epidural tumor for spinal cord decompression, but have emphasized that he will require further treatment, possibly including stereotactic radiosurgery to the spine and certainly systemic therapy.  As regards the severe lumbar stenosis, I feel that once he is recovered from the thoracic decompression and initiated his further adjuvant therapy, we could then consider a decompressive lumbar laminectomy to treat his neurogenic claudication.  I've discussed the nature of his condition, and the specifics of the recommended thoracic laminectomy for tumor resection.  We discussed typical surgery, hospitalization, and recovery. We have discussed risks of surgery including risks of infection, bleeding, possibly need for transfusion, the risk of increased neurologic deficits including paraplegia  and bowel and bladder dysfunction, and anesthetic risks of myocardial infarction, stroke, pneumonia, and death. Understanding all this, and understanding that left untreated the tumor will progress and he will develop increasing paralysis, he wishes to proceed with surgery, which is scheduled for tomorrow.  I've recommended that in the interim we will obtain MRIs of the thoracic and lumbar spine with contrast, since that portion of the examination is not completed 2 days ago because of pain.  Preoperative orders have been written. The patient's and his daughter's questions were answered for them.   Hosie Spangle, MD 01/28/2014, 1:25 PM'

## 2014-01-28 NOTE — Progress Notes (Signed)
[place] TEAM 1 - Stepdown/ICU TEAM Progress Note  Clarence Pollard FSF:[id number] DOB: [birth year] DOA: 01/26/2014 PCP: [name], MD  Admit HPI / Brief Narrative: 73 y.o. 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 Symptom onset was sudden and progressively worsening. No urinary or bowel symptoms (retention or incontinence). Endorsed worsening of ascending BLE numbness up to hips.  In the ED spinal cord compression was seen on MRI, no spinal cord edema at this time.   HPI/Subjective: Up in chair without complaints.  Eating lunch w/ no problems.  States pain in back has greatly improved since admit.    Assessment/Plan:  Spinal cord compression Secondary to metastatic prostate cancer - patient will have neurosurgical debulking of tumor on 11/12 - per notes does not appear to be candidate for further radiation treatment - awaiting postoperative recommendations from Oncology - continue Decadron 10 mg QID - provide IV MSO4 for pain while NPO beginning on 11/12 at 10 am  Hypokalemia K low on 11/09 labs - repeat low so replete and follow trend - check Mg in AM   Insomnia Continue trazodone 25 mg QHS  Code Status: FULL Family Communication: no family present at time of exam Disposition Plan: SDU  Consultants: Dr. Burney Gauze (oncology) Dr. Jovita Gamma (neurosurgery)  Procedures: none  Antibiotics: NA  DVT prophylaxis: SCD  Objective: Blood pressure 142/86, pulse 90, temperature 98 F (36.7 C), temperature source Oral, resp. rate 19, height 6' (1.829 m), weight 116.3 kg (256 lb 6.3 oz), SpO2 96 %.  Intake/Output Summary (Last 24 hours) at 01/28/14 1408 Last data filed at 01/28/14 1100  Gross per 24 hour  Intake      0 ml  Output   2250 ml  Net  -2250 ml   Exam: General: No acute  respiratory distress Neuro: Lower extremity strength and sensation intact Lungs: Clear to auscultation bilaterally without wheezes or crackles Cardiovascular: Regular rate and rhythm without murmur gallop or rub normal S1 and S2 Abdomen: Nontender, nondistended, soft, bowel sounds positive, no rebound, no ascites, no appreciable mass Extremities: No significant cyanosis, clubbing, or edema bilateral lower extremities  Data Reviewed: Basic Metabolic Panel:  Recent Labs Lab 01/26/14 1647 01/26/14 1809 01/28/14 1250  NA 141 143 140  K 3.5* 3.4* 3.6*  CL 103 107 100  CO2 23  --  25  GLUCOSE 92 94 183*  BUN 11 8 16   CREATININE 0.77 0.80  0.80 0.72  CALCIUM 8.8  --  8.5   Liver Function Tests:  Recent Labs Lab 01/26/14 1647  AST 22  ALT 14  ALKPHOS 183*  BILITOT 0.5  PROT 9.2*  ALBUMIN 3.9   CBC:  Recent Labs Lab 01/26/14 1647 01/26/14 1809  WBC 8.1  --   NEUTROABS 5.4  --   HGB 10.1* 10.2*  HCT 33.2* 30.0*  MCV 79.6  --   PLT 335  --     Recent Results (from the past 240 hour(s))  MRSA PCR Screening     Status: None   Collection Time: 01/27/14  2:39 AM  Result Value Ref Range Status   MRSA by PCR NEGATIVE NEGATIVE Final    Comment:        The GeneXpert MRSA Assay (FDA approved for NASAL specimens only), is one component of a  comprehensive MRSA colonization surveillance program. It is not intended to diagnose MRSA infection nor to guide or monitor treatment for MRSA infections.      Studies:  Recent x-ray studies have been reviewed in detail by the Attending Physician  Scheduled Meds:  Scheduled Meds: . baclofen  10 mg Oral BID  . benztropine  0.5 mg Oral Q12H  . bicalutamide  50 mg Oral Daily  . brimonidine  1 drop Both Eyes QHS  . dexamethasone  10 mg Intravenous 4 times per day  . ferrous sulfate  325 mg Oral QHS  . latanoprost  1 drop Both Eyes QHS    Time spent on care of this patient: 35 mins   Samella Parr , MD   [facility] Office  [phone] Pager - [phone]  On-Call/Text Page:      [email or web address]      password [email or web address]  If 7PM-7AM, please contact night-coverage [email or web address] Password [email or web address] 01/28/2014, 2:08 PM   LOS: 2 days   I have personally examined this patient and reviewed the entire database. I have reviewed the above note, made any necessary editorial changes, and agree with its content.  Cherene Altes, MD [facility]

## 2014-01-29 ENCOUNTER — Inpatient Hospital Stay (HOSPITAL_COMMUNITY): Payer: Medicare Other

## 2014-01-29 ENCOUNTER — Inpatient Hospital Stay (HOSPITAL_COMMUNITY): Payer: Medicare Other | Admitting: Anesthesiology

## 2014-01-29 ENCOUNTER — Encounter (HOSPITAL_COMMUNITY): Payer: Self-pay | Admitting: Anesthesiology

## 2014-01-29 ENCOUNTER — Encounter (HOSPITAL_COMMUNITY)
Admission: EM | Disposition: A | Discharge status: Rehabilitation Facility | Payer: Self-pay | Source: Home / Self Care | Attending: Internal Medicine

## 2014-01-29 DIAGNOSIS — D434 Neoplasm of uncertain behavior of spinal cord: Secondary | ICD-10-CM

## 2014-01-29 DIAGNOSIS — D497 Neoplasm of unspecified behavior of endocrine glands and other parts of nervous system: Secondary | ICD-10-CM | POA: Insufficient documentation

## 2014-01-29 DIAGNOSIS — R29898 Other symptoms and signs involving the musculoskeletal system: Secondary | ICD-10-CM

## 2014-01-29 HISTORY — PX: LAMINECTOMY: SHX219

## 2014-01-29 LAB — BASIC METABOLIC PANEL
ANION GAP: 16 — AB (ref 5–15)
BUN: 21 mg/dL (ref 6–23)
CO2: 23 mEq/L (ref 19–32)
CREATININE: 0.76 mg/dL (ref 0.50–1.35)
Calcium: 8.5 mg/dL (ref 8.4–10.5)
Chloride: 101 mEq/L (ref 96–112)
GFR, EST NON AFRICAN AMERICAN: 89 mL/min — AB (ref >90)
Glucose, Bld: 192 mg/dL — ABNORMAL HIGH (ref 70–99)
Potassium: 4 mEq/L (ref 3.7–5.3)
Sodium: 140 mEq/L (ref 137–147)

## 2014-01-29 LAB — GLUCOSE, CAPILLARY
GLUCOSE-CAPILLARY: 254 mg/dL — AB (ref 70–99)
GLUCOSE-CAPILLARY: 159 mg/dL — AB (ref 70–99)
Glucose-Capillary: 154 mg/dL — ABNORMAL HIGH (ref 70–99)

## 2014-01-29 LAB — CBC
HCT: 31.4 % — ABNORMAL LOW (ref 39.0–52.0)
HEMOGLOBIN: 9.5 g/dL — AB (ref 13.0–17.0)
MCH: 24.1 pg — AB (ref 26.0–34.0)
MCHC: 30.3 g/dL (ref 30.0–36.0)
MCV: 79.5 fL (ref 78.0–100.0)
PLATELETS: 389 10*3/uL (ref 150–400)
RBC: 3.95 MIL/uL — ABNORMAL LOW (ref 4.22–5.81)
RDW: 18.6 % — AB (ref 11.5–15.5)
WBC: 17.5 10*3/uL — ABNORMAL HIGH (ref 4.0–10.5)

## 2014-01-29 LAB — PREPARE RBC (CROSSMATCH)

## 2014-01-29 LAB — MAGNESIUM: MAGNESIUM: 2.1 mg/dL (ref 1.5–2.5)

## 2014-01-29 SURGERY — THORACIC LAMINECTOMY FOR TUMOR
Anesthesia: General | Site: Back

## 2014-01-29 MED ORDER — PHENYLEPHRINE HCL 10 MG/ML IJ SOLN
10.0000 mg | INTRAVENOUS | Status: DC | PRN
  Administered 2014-01-29: 50 ug/min via INTRAVENOUS

## 2014-01-29 MED ORDER — PHENYLEPHRINE HCL 10 MG/ML IJ SOLN
INTRAMUSCULAR | Status: DC | PRN
  Administered 2014-01-29: 80 ug via INTRAVENOUS

## 2014-01-29 MED ORDER — MIDAZOLAM HCL 2 MG/2ML IJ SOLN
INTRAMUSCULAR | Status: AC
  Filled 2014-01-29: qty 2

## 2014-01-29 MED ORDER — FENTANYL CITRATE 0.05 MG/ML IJ SOLN
INTRAMUSCULAR | Status: AC
  Filled 2014-01-29: qty 5

## 2014-01-29 MED ORDER — THROMBIN 20000 UNITS EX SOLR
CUTANEOUS | Status: DC | PRN
  Administered 2014-01-29: 20 mL via TOPICAL

## 2014-01-29 MED ORDER — LIDOCAINE HCL (CARDIAC) 20 MG/ML IV SOLN
INTRAVENOUS | Status: DC | PRN
  Administered 2014-01-29: 60 mg via INTRAVENOUS

## 2014-01-29 MED ORDER — DEXAMETHASONE SODIUM PHOSPHATE 4 MG/ML IJ SOLN
INTRAMUSCULAR | Status: AC
  Filled 2014-01-29: qty 3

## 2014-01-29 MED ORDER — SODIUM CHLORIDE 0.9 % IV SOLN: INTRAVENOUS | Status: DC | PRN

## 2014-01-29 MED ORDER — FENTANYL CITRATE 0.05 MG/ML IJ SOLN
INTRAMUSCULAR | Status: DC | PRN
  Administered 2014-01-29: 50 ug via INTRAVENOUS
  Administered 2014-01-29: 100 ug via INTRAVENOUS
  Administered 2014-01-29 (×3): 50 ug via INTRAVENOUS

## 2014-01-29 MED ORDER — OXYCODONE HCL 5 MG PO TABS: 5.0000 mg | ORAL_TABLET | Freq: Once | ORAL | Status: AC | PRN

## 2014-01-29 MED ORDER — POTASSIUM CHLORIDE IN NACL 20-0.9 MEQ/L-% IV SOLN
INTRAVENOUS | Status: DC
  Administered 2014-01-29: 21:00:00 via INTRAVENOUS
  Filled 2014-01-29 (×2): qty 1000

## 2014-01-29 MED ORDER — ACETAMINOPHEN 10 MG/ML IV SOLN
INTRAVENOUS | Status: AC
  Administered 2014-01-29: 1000 mg via INTRAVENOUS
  Filled 2014-01-29: qty 100

## 2014-01-29 MED ORDER — OXYCODONE HCL 5 MG/5ML PO SOLN: 5.0000 mg | Freq: Once | ORAL | Status: AC | PRN

## 2014-01-29 MED ORDER — SUCCINYLCHOLINE CHLORIDE 20 MG/ML IJ SOLN
INTRAMUSCULAR | Status: DC | PRN
  Administered 2014-01-29: 100 mg via INTRAVENOUS

## 2014-01-29 MED ORDER — LIDOCAINE-EPINEPHRINE 1 %-1:100000 IJ SOLN
INTRAMUSCULAR | Status: DC | PRN
  Administered 2014-01-29: 15 mL

## 2014-01-29 MED ORDER — OXYCODONE-ACETAMINOPHEN 5-325 MG PO TABS
1.0000 | ORAL_TABLET | ORAL | Status: DC | PRN
  Administered 2014-01-30 (×2): 2 via ORAL
  Filled 2014-01-29 (×2): qty 2

## 2014-01-29 MED ORDER — SODIUM CHLORIDE 0.9 % IV SOLN: Freq: Once | INTRAVENOUS | Status: DC

## 2014-01-29 MED ORDER — INFLUENZA VAC SPLIT QUAD 0.5 ML IM SUSY: 0.5000 mL | PREFILLED_SYRINGE | INTRAMUSCULAR | Status: DC | PRN

## 2014-01-29 MED ORDER — ONDANSETRON HCL 4 MG/2ML IJ SOLN
INTRAMUSCULAR | Status: DC | PRN
  Administered 2014-01-29: 4 mg via INTRAVENOUS

## 2014-01-29 MED ORDER — LACTATED RINGERS IV SOLN
INTRAVENOUS | Status: DC | PRN
  Administered 2014-01-29: 15:00:00 via INTRAVENOUS

## 2014-01-29 MED ORDER — NEOSTIGMINE METHYLSULFATE 10 MG/10ML IV SOLN
INTRAVENOUS | Status: DC | PRN
Start: 2014-01-29 — End: 2014-01-29
  Administered 2014-01-29: 5 mg via INTRAVENOUS

## 2014-01-29 MED ORDER — LACTATED RINGERS IV SOLN
INTRAVENOUS | Status: DC | PRN
  Administered 2014-01-29 (×2): via INTRAVENOUS

## 2014-01-29 MED ORDER — VECURONIUM BROMIDE 10 MG IV SOLR
INTRAVENOUS | Status: DC | PRN
  Administered 2014-01-29: 2 mg via INTRAVENOUS

## 2014-01-29 MED ORDER — BACITRACIN 50000 UNITS IM SOLR
INTRAMUSCULAR | Status: DC | PRN
  Administered 2014-01-29 (×2)

## 2014-01-29 MED ORDER — 0.9 % SODIUM CHLORIDE (POUR BTL) OPTIME
TOPICAL | Status: DC | PRN
  Administered 2014-01-29: 1000 mL

## 2014-01-29 MED ORDER — MORPHINE SULFATE 2 MG/ML IJ SOLN: 1.0000 mg | INTRAMUSCULAR | Status: DC | PRN

## 2014-01-29 MED ORDER — ROCURONIUM BROMIDE 100 MG/10ML IV SOLN
INTRAVENOUS | Status: DC | PRN
Start: 2014-01-29 — End: 2014-01-29
  Administered 2014-01-29: 20 mg via INTRAVENOUS
  Administered 2014-01-29: 30 mg via INTRAVENOUS

## 2014-01-29 MED ORDER — HYDROMORPHONE HCL 1 MG/ML IJ SOLN: 0.2500 mg | INTRAMUSCULAR | Status: DC | PRN

## 2014-01-29 MED ORDER — BUPIVACAINE HCL (PF) 0.25 % IJ SOLN
INTRAMUSCULAR | Status: DC | PRN
  Administered 2014-01-29: 15 mL

## 2014-01-29 MED ORDER — NEOSTIGMINE METHYLSULFATE 10 MG/10ML IV SOLN
INTRAVENOUS | Status: AC
  Filled 2014-01-29: qty 1

## 2014-01-29 MED ORDER — CEFAZOLIN SODIUM-DEXTROSE 2-3 GM-% IV SOLR
INTRAVENOUS | Status: DC | PRN
  Administered 2014-01-29: 2 g via INTRAVENOUS

## 2014-01-29 MED ORDER — PROPOFOL 10 MG/ML IV BOLUS
INTRAVENOUS | Status: DC | PRN
  Administered 2014-01-29: 150 mg via INTRAVENOUS

## 2014-01-29 MED ORDER — GLYCOPYRROLATE 0.2 MG/ML IJ SOLN
INTRAMUSCULAR | Status: DC | PRN
  Administered 2014-01-29: .9 mg via INTRAVENOUS

## 2014-01-29 MED ORDER — PROMETHAZINE HCL 25 MG/ML IJ SOLN: 6.2500 mg | INTRAMUSCULAR | Status: DC | PRN

## 2014-01-29 MED ORDER — LIDOCAINE HCL (CARDIAC) 20 MG/ML IV SOLN
INTRAVENOUS | Status: AC
  Filled 2014-01-29: qty 5

## 2014-01-29 MED ORDER — DEXAMETHASONE SODIUM PHOSPHATE 10 MG/ML IJ SOLN
INTRAMUSCULAR | Status: DC | PRN
  Administered 2014-01-29: 10 mg via INTRAVENOUS

## 2014-01-29 MED ORDER — GLYCOPYRROLATE 0.2 MG/ML IJ SOLN
INTRAMUSCULAR | Status: AC
  Filled 2014-01-29: qty 5

## 2014-01-29 MED ORDER — THROMBIN 5000 UNITS EX SOLR
CUTANEOUS | Status: DC | PRN
  Administered 2014-01-29: 17:00:00 via TOPICAL

## 2014-01-29 SURGICAL SUPPLY — 63 items
BAG DECANTER FOR FLEXI CONT (MISCELLANEOUS) ×2 IMPLANT
BENZOIN TINCTURE PRP APPL 2/3 (GAUZE/BANDAGES/DRESSINGS) IMPLANT
BLADE SURG 11 STRL SS (BLADE) ×2 IMPLANT
BLADE ULTRA TIP 2M (BLADE) IMPLANT
BRUSH SCRUB EZ 1% IODOPHOR (MISCELLANEOUS) ×2 IMPLANT
BUR ACRON 5.0MM COATED (BURR) IMPLANT
BUR MATCHSTICK NEURO 3.0 LAGG (BURR) IMPLANT
CANISTER SUCT 3000ML (MISCELLANEOUS) ×2 IMPLANT
CLIP TI MEDIUM 6 (CLIP) IMPLANT
CONT SPEC 4OZ CLIKSEAL STRL BL (MISCELLANEOUS) ×2 IMPLANT
COVER MAYO STAND STRL (DRAPES) IMPLANT
DRAPE LAPAROTOMY 100X72 PEDS (DRAPES) IMPLANT
DRAPE LAPAROTOMY 100X72X124 (DRAPES) IMPLANT
DRAPE MICROSCOPE LEICA (MISCELLANEOUS) IMPLANT
DRAPE POUCH INSTRU U-SHP 10X18 (DRAPES) ×2 IMPLANT
DRSG AQUACEL AG ADV 3.5X14 (GAUZE/BANDAGES/DRESSINGS) ×2 IMPLANT
DRSG EMULSION OIL 3X3 NADH (GAUZE/BANDAGES/DRESSINGS) IMPLANT
ELECT REM PT RETURN 9FT ADLT (ELECTROSURGICAL) ×2
ELECTRODE REM PT RTRN 9FT ADLT (ELECTROSURGICAL) ×1 IMPLANT
GAUZE SPONGE 4X4 12PLY STRL (GAUZE/BANDAGES/DRESSINGS) IMPLANT
GAUZE SPONGE 4X4 16PLY XRAY LF (GAUZE/BANDAGES/DRESSINGS) IMPLANT
GLOVE BIOGEL M 8.0 STRL (GLOVE) ×2 IMPLANT
GLOVE BIOGEL PI IND STRL 8 (GLOVE) ×1 IMPLANT
GLOVE BIOGEL PI INDICATOR 8 (GLOVE) ×1
GLOVE ECLIPSE 7.5 STRL STRAW (GLOVE) ×2 IMPLANT
GLOVE EXAM NITRILE LRG STRL (GLOVE) IMPLANT
GLOVE EXAM NITRILE MD LF STRL (GLOVE) IMPLANT
GLOVE EXAM NITRILE XL STR (GLOVE) IMPLANT
GLOVE EXAM NITRILE XS STR PU (GLOVE) IMPLANT
GOWN STRL REUS W/ TWL LRG LVL3 (GOWN DISPOSABLE) IMPLANT
GOWN STRL REUS W/ TWL XL LVL3 (GOWN DISPOSABLE) IMPLANT
GOWN STRL REUS W/TWL 2XL LVL3 (GOWN DISPOSABLE) IMPLANT
GOWN STRL REUS W/TWL LRG LVL3 (GOWN DISPOSABLE)
GOWN STRL REUS W/TWL XL LVL3 (GOWN DISPOSABLE)
HEMOSTAT SURGICEL 2X14 (HEMOSTASIS) IMPLANT
KIT BASIN OR (CUSTOM PROCEDURE TRAY) ×2 IMPLANT
KIT ROOM TURNOVER OR (KITS) ×2 IMPLANT
NEEDLE SPNL 18GX3.5 QUINCKE PK (NEEDLE) ×4 IMPLANT
NEEDLE SPNL 22GX3.5 QUINCKE BK (NEEDLE) ×2 IMPLANT
NS IRRIG 1000ML POUR BTL (IV SOLUTION) ×2 IMPLANT
PACK LAMINECTOMY NEURO (CUSTOM PROCEDURE TRAY) ×2 IMPLANT
PAD ARMBOARD 7.5X6 YLW CONV (MISCELLANEOUS) ×6 IMPLANT
PATTIES SURGICAL .25X.25 (GAUZE/BANDAGES/DRESSINGS) IMPLANT
PATTIES SURGICAL .5 X3 (DISPOSABLE) ×2 IMPLANT
PATTIES SURGICAL 1/4 X 3 (GAUZE/BANDAGES/DRESSINGS) ×2 IMPLANT
RUBBERBAND STERILE (MISCELLANEOUS) IMPLANT
SPECIMEN JAR SMALL (MISCELLANEOUS) IMPLANT
SPONGE LAP 4X18 X RAY DECT (DISPOSABLE) IMPLANT
SPONGE NEURO XRAY DETECT 1X3 (DISPOSABLE) IMPLANT
SPONGE SURGIFOAM ABS GEL 100 (HEMOSTASIS) ×2 IMPLANT
STRIP CLOSURE SKIN 1/4X4 (GAUZE/BANDAGES/DRESSINGS) IMPLANT
SUT PROLENE 6 0 BV (SUTURE) IMPLANT
SUT VIC AB 0 CT1 18XCR BRD8 (SUTURE) ×1 IMPLANT
SUT VIC AB 0 CT1 8-18 (SUTURE) ×1
SUT VIC AB 1 CT1 18XBRD ANBCTR (SUTURE) ×4 IMPLANT
SUT VIC AB 1 CT1 8-18 (SUTURE) ×4
SUT VIC AB 2-0 CP2 18 (SUTURE) ×6 IMPLANT
SYR 20ML ECCENTRIC (SYRINGE) ×2 IMPLANT
TIP SONASTAR STD MISONIX 1.9 (TRAY / TRAY PROCEDURE) IMPLANT
TOWEL OR 17X24 6PK STRL BLUE (TOWEL DISPOSABLE) ×2 IMPLANT
TOWEL OR 17X26 10 PK STRL BLUE (TOWEL DISPOSABLE) ×2 IMPLANT
TRAY FOLEY CATH 14FRSI W/METER (CATHETERS) IMPLANT
WATER STERILE IRR 1000ML POUR (IV SOLUTION) ×2 IMPLANT

## 2014-01-29 NOTE — Transfer of Care (Signed)
Immediate Anesthesia Transfer of Care Note  Patient: Clarence Pollard  Procedure(s) Performed: Procedure(s) with comments: Thoracic laminectomy for tumor resection (N/A) - Thoracic laminectomy for tumor resection  Patient Location: PACU  Anesthesia Type:General  Level of Consciousness: awake  Airway & Oxygen Therapy: Patient Spontanous Breathing and Patient connected to face mask oxygen  Post-op Assessment: Report given to PACU RN, Post -op Vital signs reviewed and stable and Patient moving all extremities X 4  Post vital signs: Reviewed and stable  Complications: No apparent anesthesia complications

## 2014-01-29 NOTE — Anesthesia Preprocedure Evaluation (Addendum)
Anesthesia Evaluation  Patient identified by MRN, date of birth, ID band Patient awake    Reviewed: Allergy & Precautions, H&P , NPO status , Patient's Chart, lab work & pertinent test results  Airway Mallampati: III  TM Distance: <3 FB Neck ROM: Full    Dental  (+) Teeth Intact   Pulmonary former smoker,  breath sounds clear to auscultation        Cardiovascular negative cardio ROS  Rhythm:Regular Rate:Normal     Neuro/Psych Thoracic tumor with BLE weakness and back pain 2/2 spinal cord compression. negative psych ROS   GI/Hepatic negative GI ROS, Neg liver ROS,   Endo/Other  Morbid obesity  Renal/GU negative Renal ROS     Musculoskeletal   Abdominal   Peds  Hematology  (+) anemia , Hgb 9.5   Anesthesia Other Findings Limited IV access.  Reproductive/Obstetrics                            Anesthesia Physical Anesthesia Plan  ASA: III  Anesthesia Plan: General   Post-op Pain Management:    Induction: Intravenous  Airway Management Planned: Oral ETT  Additional Equipment: Arterial line and CVP  Intra-op Plan:   Post-operative Plan: Extubation in OR  Informed Consent: I have reviewed the patients History and Physical, chart, labs and discussed the procedure including the risks, benefits and alternatives for the proposed anesthesia with the patient or authorized representative who has indicated his/her understanding and acceptance.   Dental advisory given  Plan Discussed with: CRNA and Surgeon  Anesthesia Plan Comments:        Anesthesia Quick Evaluation

## 2014-01-29 NOTE — Progress Notes (Signed)
Clarence Pollard Progress Note  Clarence Pollard ZYS:[id number] DOB: July 07, 1940 DOA: 01/26/2014 PCP: [name], MD  Admit HPI / Brief Narrative: Clarence Pollard is a 73 y.o. 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  S/P XRT  to the thoracic spine between T7 and T8-28 October 2012. S/P Treatment with Casodex and Lupron.   Presents to ED with BLE cramping and weakness for past 6 days. Symptoms onset suddenly on Wed morning and have been progressively worsening. Described as feeling like his legs are about to give out. No urinary or bowel symptoms (retention or incontinence). Does have worsening ascending BLE numbness up to hips.  Spinal cord compression seen on MRI, no spinal cord edema at this time.   HPI/Subjective: 11/12 patient sitting comfortably on side of bed, has consumed a light breakfast which neurosurgery permitted. Patient will have surgeons afternoon. Oncology has not seen patient yet, will need to contact to ensure they have been informed the patient was admitted. Patient continues to have left lower back pain in the lumbar region. no encopresis/encoresis.    Assessment/Plan: Spinal cord compression -Secondary to metastatic prostate cancer -patient will have neurosurgical debulking of tumor on 11/12.  -11/12 spoke with Dr. Zola Button  (oncology), they are aware that patient has been admitted for surgery and plan to see him as an outpatient in a couple of weeks for ongoing XRT treatment. -Continue Decadron 10 mg QID  Metastatic prostate cancer -When cleared for discharge by neurosurgery patient will need an appointment with Dr. Zola Button  (oncology), to discuss further XRT treatment.  Insomnia -Continue trazodone 25 mg QHS    Code Status: FULL Family Communication: family present at time of exam Disposition Plan: per neurosurgery, oncology   Consultants: Dr. Burney Gauze Dr. Zola Button  (oncology)  Dr. Jovita Gamma  (neurosurgery)  Procedure/Significant Events: 11/9 MRI T-spine/L-spine;Worsening diffuse osseous metastasis with low signal presumed sclerotic lesions. -Increasing soft tissue/tumor from T 8 through T10, extending into the epidural space (extending 4.3 cm in craniocaudad dimension);resulting in severe canal stenosis at T9,  -thecal sac is 5 mm with cord compression. No spinal cord edema.  -Tumor extends into neural foramen resulting in moderate neural foraminal narrowing at T9-10. -Soft tissue/tumor extends into prevertebral soft tissues at T9. -Stranding of the RIGHT lung at the level of T8 through T10 may reflect postradiation change,  -Severe paraspinal muscle atrophy.  -L2-3: Small broad-based disc bulge with superimposed small bowel subarticular extra foraminal disc protrusion encroaching upon the exited LEFT L2 nerve.  -. Mild RIGHT, mild to moderate LEFT neural foraminal narrowing. -L3: Moderate RIGHT, mild to moderate LEFTneural narrowing. -L4-5: Moderate to large broad-based disc bulge. 2 mm bilateral facet effusions which are likely reactive.  -Severe canal stenosis,  Moderate RIGHT, severe LEFT neural foraminal narrowing. -L5-S1: Moderate to large broad-based disc bulge may encroach upon exited RIGHT L5 nerve.  -Partial effacement of RIGHT lateral recess may affect traversing RIGHT S1 nerve. -Moderate to severe RIGHT,moderate LEFT neural foraminal narrowing.     Culture NA  Antibiotics: NA  DVT prophylaxis: SCD   Devices NA   LINES / TUBES:  NA    Continuous Infusions:   Objective: VITAL SIGNS: Temp: 97.8 F (36.6 C) (11/12 0359) Temp Source: Oral (11/12 0359) BP: 138/68 mmHg (11/12 0359) Pulse Rate: 83 (11/12 0359) SPO2; FIO2:   Intake/Output Summary (Last 24 hours) at 01/29/14 0630 Last data filed at 01/29/14 0401  Gross per 24 hour  Intake  240 ml  Output   1300 ml  Net  -1060 ml     Exam: General: A/O 4, NAD, No acute respiratory  distress Lungs: Clear to auscultation bilaterally without wheezes or crackles Cardiovascular: Regular rate and rhythm without murmur gallop or rub normal S1 and S2 Abdomen: Nontender, nondistended, soft, bowel sounds positive, no rebound, no ascites, no appreciable mass Extremities: No significant cyanosis, clubbing, or edema bilateral lower extremities Musculoskeletal; muscle spasm paraspinal muscles lumbar area left side, negative midline tenderness  Data Reviewed: Basic Metabolic Panel:  Recent Labs Lab 01/26/14 1647 01/26/14 1809 01/28/14 1250 01/29/14 0235  NA 141 143 140 140  K 3.5* 3.4* 3.6* 4.0  CL 103 107 100 101  CO2 23  --  25 23  GLUCOSE 92 94 183* 192*  BUN 11 8 16 21   CREATININE 0.77 0.80  0.80 0.72 0.76  CALCIUM 8.8  --  8.5 8.5  MG  --   --   --  2.1   Liver Function Tests:  Recent Labs Lab 01/26/14 1647  AST 22  ALT 14  ALKPHOS 183*  BILITOT 0.5  PROT 9.2*  ALBUMIN 3.9   No results for input(s): LIPASE, AMYLASE in the last 168 hours. No results for input(s): AMMONIA in the last 168 hours. CBC:  Recent Labs Lab 01/26/14 1647 01/26/14 1809 01/29/14 0235  WBC 8.1  --  17.5*  NEUTROABS 5.4  --   --   HGB 10.1* 10.2* 9.5*  HCT 33.2* 30.0* 31.4*  MCV 79.6  --  79.5  PLT 335  --  389   Cardiac Enzymes: No results for input(s): CKTOTAL, CKMB, CKMBINDEX, TROPONINI in the last 168 hours. BNP (last 3 results) No results for input(s): PROBNP in the last 8760 hours. CBG:  Recent Labs Lab 01/28/14 2155  GLUCAP 207*    Recent Results (from the past 240 hour(s))  MRSA PCR Screening     Status: None   Collection Time: 01/27/14  2:39 AM  Result Value Ref Range Status   MRSA by PCR NEGATIVE NEGATIVE Final    Comment:        The GeneXpert MRSA Assay (FDA approved for NASAL specimens only), is one component of a comprehensive MRSA colonization surveillance program. It is not intended to diagnose MRSA infection nor to guide or monitor  treatment for MRSA infections.      Studies:  Recent x-ray studies have been reviewed in detail by the Attending Physician  Scheduled Meds:  Scheduled Meds: . baclofen  10 mg Oral BID  . benztropine  0.5 mg Oral Q12H  . bicalutamide  50 mg Oral Daily  . brimonidine  1 drop Both Eyes QHS  . dexamethasone  10 mg Intravenous 4 times per day  . ferrous sulfate  325 mg Oral QHS  . insulin aspart  0-9 Units Subcutaneous TID WC  . latanoprost  1 drop Both Eyes QHS  . pantoprazole  40 mg Oral Q1200    Time spent on care of this patient: 40 mins   Allie Bossier , MD   [facility] Office  [phone] Pager [phone]  On-Call/Text Page:      [email or web address]      password [email or web address]  If 7PM-7AM, please contact night-coverage [email or web address] Password [email or web address] 01/29/2014, 6:30 AM   LOS: 3 days

## 2014-01-29 NOTE — Progress Notes (Signed)
Subjective: Patient resting in recovery room. Initially quite lethargic with very limited response. CPAP initiated by anesthesia service, and with that patient is much more responsive. To be transferred to [facility] shortly.  Objective: Vital signs in last 24 hours: Filed Vitals:   01/29/14 1930 01/29/14 1936 01/29/14 1952 01/29/14 1958  BP:  156/79 160/70   Pulse: 83 79  76  Temp:      TempSrc:      Resp: 16 16  17   Height:      Weight:      SpO2: 100% 100%  100%    Intake/Output from previous day: 11/11 0701 - 11/12 0700 In: 240 [P.O.:240] Out: 1200 [Urine:1200] Intake/Output this shift:    Physical Exam:  Awake, opening eyes. Following commands with all 4 extremities. Good strength in distal lower extremities. Dressing clean and dry.  CBC  Recent Labs  01/29/14 0235  WBC 17.5*  HGB 9.5*  HCT 31.4*  PLT 389   BMET  Recent Labs  01/28/14 1250 01/29/14 0235  NA 140 140  K 3.6* 4.0  CL 100 101  CO2 25 23  GLUCOSE 183* 192*  BUN 16 21  CREATININE 0.72 0.76  CALCIUM 8.5 8.5    Studies/Results: Mr Thoracic Spine W Wo Contrast  01/28/2014   CLINICAL DATA:  Metastatic prostate cancer to the thoracic and lumbar spine. Spinal stenosis.  EXAM: MRI THORACIC AND LUMBAR SPINE WITHOUT AND WITH CONTRAST  TECHNIQUE: Multiplanar and multiecho pulse sequences of the thoracic and lumbar spine were obtained without and with intravenous contrast.  CONTRAST:  61mL MULTIHANCE GADOBENATE DIMEGLUMINE 529 MG/ML IV SOLN  COMPARISON:  MRI of the thoracic and lumbar spine without contrast 01/26/2014.  FINDINGS: MR THORACIC SPINE FINDINGS  Normal signal is present in the thoracic spinal cord. Diffuse metastatic disease is present. There is tumor infiltration at T6 and diffusely on the left at T8, T9, and T10. There is extraosseous tumor extension at the T9 level to the left greater than right. There is significant compression of the central spinal cord at the T9 level is previously  documented. Osseous foraminal narrowing bilaterally is worse on the left at T9-10. There is mild foraminal narrowing bilaterally at T8-9 and T10-11 due to tumor encroachment. Tumor extends into the pedicles bilaterally at T8, T9, and T10. There is tumor in the left portion of the vertebral body at T7 and T11. A 14 mm lesion is present within the right aspect vertebral body at T12.  The postcontrast images demonstrate enhancement of tumor within the bone as described. There is additionally dural enhancement and tumor infiltration diffusely from T8-9 through T10-11. The axial images are somewhat distorted by patient motion particularly through this area. The extraosseous soft tissue at the T9 level demonstrates enhancement as well.  MR LUMBAR SPINE FINDINGS  The postcontrast images of lumbar spine are significantly degraded by patient motion.  Tumor infiltration is present within the posterior vertebral body at L2 without significant extraosseous tumor. Chronic endplate marrow changes are present at L5-S1. No other definite tumor is present. Limited imaging of the abdomen is unremarkable. There is no significant adenopathy.  L1-2: Leftward disc bulging and mild facet hypertrophy are present without significant stenosis.  L2-3: A leftward disc protrusion is present. Moderate facet hypertrophy is evident. This results in mild to moderate left subarticular and foraminal narrowing. Right subarticular and foraminal narrowing is mild.  L3-4: A broad-based disc protrusion and moderate facet hypertrophy results an moderate subarticular and foraminal narrowing  bilaterally, slightly worse on the right.  L4-5: Severe central canal stenosis is due to a broad-based disc protrusion and advanced facet hypertrophy. Moderate left and mild to moderate right foraminal stenosis is present.  L5-S1: A rightward disc protrusion is present. Moderate facet hypertrophy is noted. This results in moderate right subarticular stenosis and mild  left subarticular narrowing. Mild to moderate foraminal narrowing is worse on the right.  IMPRESSION: 1. Moderate to severe central canal stenosis at T9 secondary to circumferential tumor involving the T8, T9, and T10 vertebral bodies. 2. Extraosseous tumor on the left at T9. 3. Dural-based tumor extends from T8-9 through T10-11, worse on the left. 4. Foraminal narrowing is present bilaterally from T8-9 through T10-11. It is greatest at T9-10. 5. Tumor within the posterior body of L2 without collapse or extraosseous extension. 6. Spondylosis of the lumbar spine is unrelated to tumor. 7. Mild to moderate left subarticular and foraminal narrowing at L2-3. 8. Moderate subarticular and foraminal stenosis bilaterally at L3-4 is worse on the right. 9. Severe central canal stenosis at L4-5 secondary to a broad-based disc protrusion and advanced facet hypertrophy. 10. Moderate left and mild to moderate right foraminal narrowing at L4-5. 11. Rightward disc protrusion with right subarticular and bilateral foraminal narrowing at L5-S1.   Electronically Signed   By: Lawrence Santiago M.D.   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 Spinal stenosis.  EXAM: MRI THORACIC AND LUMBAR SPINE WITHOUT AND WITH CONTRAST  TECHNIQUE: Multiplanar and multiecho pulse sequences of the thoracic and lumbar spine were obtained without and with intravenous contrast.  CONTRAST:  56mL MULTIHANCE GADOBENATE DIMEGLUMINE 529 MG/ML IV SOLN  COMPARISON:  MRI of the thoracic and lumbar spine without contrast 01/26/2014.  FINDINGS: MR THORACIC SPINE FINDINGS  Normal signal is present in the thoracic spinal cord. Diffuse metastatic disease is present. There is tumor infiltration at T6 and diffusely on the left at T8, T9, and T10. There is extraosseous tumor extension at the T9 level to the left greater than right. There is significant compression of the  central spinal cord at the T9 level is previously documented. Osseous foraminal narrowing bilaterally is worse on the left at T9-10. There is mild foraminal narrowing bilaterally at T8-9 and T10-11 due to tumor encroachment. Tumor extends into the pedicles bilaterally at T8, T9, and T10. There is tumor in the left portion of the vertebral body at T7 and T11. A 14 mm lesion is present within the right aspect vertebral body at T12.  The postcontrast images demonstrate enhancement of tumor within the bone as described. There is additionally dural enhancement and tumor infiltration diffusely from T8-9 through T10-11. The axial images are somewhat distorted by patient motion particularly through this area. The extraosseous soft tissue at the T9 level demonstrates enhancement as well.  MR LUMBAR SPINE FINDINGS  The postcontrast images of lumbar spine are significantly degraded by patient motion.  Tumor infiltration is present within the posterior vertebral body at L2 without significant extraosseous tumor. Chronic endplate marrow changes are present at L5-S1. No other definite tumor is present. Limited imaging of the abdomen is unremarkable. There is no significant adenopathy.  L1-2: Leftward disc bulging and mild facet hypertrophy are present without significant stenosis.  L2-3: A leftward disc protrusion is present. Moderate facet hypertrophy is evident. This results in mild to moderate left subarticular and foraminal narrowing. Right subarticular  and foraminal narrowing is mild.  L3-4: A broad-based disc protrusion and moderate facet hypertrophy results an moderate subarticular and foraminal narrowing bilaterally, slightly worse on the right.  L4-5: Severe central canal stenosis is due to a broad-based disc protrusion and advanced facet hypertrophy. Moderate left and mild to moderate right foraminal stenosis is present.  L5-S1: A rightward disc protrusion is present. Moderate facet hypertrophy is noted. This results in  moderate right subarticular stenosis and mild left subarticular narrowing. Mild to moderate foraminal narrowing is worse on the right.  IMPRESSION: 1. Moderate to severe central canal stenosis at T9 secondary to circumferential tumor involving the T8, T9, and T10 vertebral bodies. 2. Extraosseous tumor on the left at T9. 3. Dural-based tumor extends from T8-9 through T10-11, worse on the left. 4. Foraminal narrowing is present bilaterally from T8-9 through T10-11. It is greatest at T9-10. 5. Tumor within the posterior body of L2 without collapse or extraosseous extension. 6. Spondylosis of the lumbar spine is unrelated to tumor. 7. Mild to moderate left subarticular and foraminal narrowing at L2-3. 8. Moderate subarticular and foraminal stenosis bilaterally at L3-4 is worse on the right. 9. Severe central canal stenosis at L4-5 secondary to a broad-based disc protrusion and advanced facet hypertrophy. 10. Moderate left and mild to moderate right foraminal narrowing at L4-5. 11. Rightward disc protrusion with right subarticular and bilateral foraminal narrowing at L5-S1.   Electronically Signed   By: Lawrence Santiago M.D.   On: 01/28/2014 21:13   Dg Chest Port 1 View  01/29/2014   CLINICAL DATA:  Central line placement.  EXAM: PORTABLE CHEST - 1 VIEW  COMPARISON:  None.  FINDINGS: Mildly enlarged cardiac silhouette. Mild bibasilar atelectasis. Right jugular catheter tip in the superior vena cava. No pneumothorax. Thoracic spine degenerative changes.  IMPRESSION: 1. Right jugular catheter tip in the superior vena cava without pneumothorax. 2. Mild cardiomegaly and mild bibasilar atelectasis.   Electronically Signed   By: Enrique Sack M.D.   On: 01/29/2014 19:46    Assessment/Plan: To continue to recover from anesthesia and surgery overnight.  We'll plan on getting out of bed and ambulating tomorrow.  BMET and CBC with differential in a.m.   Hosie Spangle, MD 01/29/2014, 8:15 PM

## 2014-01-29 NOTE — Anesthesia Postprocedure Evaluation (Signed)
  Anesthesia Post-op Note  Patient: Clarence Pollard  Procedure(s) Performed: Procedure(s) with comments: Thoracic laminectomy for tumor resection (N/A) - Thoracic laminectomy for tumor resection  Patient Location: PACU  Anesthesia Type:General  Level of Consciousness: awake, alert  and oriented  Airway and Oxygen Therapy: Spontaneously breathing on CPAP.  Post-op Pain: none  Post-op Assessment: Post-op Vital signs reviewed  Post-op Vital Signs: Reviewed  Last Vitals:  Filed Vitals:   01/29/14 2030  BP:   Pulse: 71  Temp: 36.4 C  Resp: 15    Complications: No apparent anesthesia complications

## 2014-01-29 NOTE — Op Note (Signed)
01/26/2014 - 01/29/2014  6:32 PM  PATIENT:  Clarence Pollard  73 y.o. 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  ANESTHESIA:   general  EBL:  Total I/O In: 635 [I.V.:300; Blood:335] Out: 1000 [Urine:500; Blood:500]  BLOOD ADMINISTERED:335 CC PRBC  COUNT:  correct per nursing staff  SPECIMEN:  Source of Specimen:  1) T9 spinous process and lamina, 2) T9 lamina and epidural tumor  DICTATION:  Patient was brought to the operating room, placed under general endotracheal anesthesia. Patient was turned to a prone position.  The thoracic and lumbar region was prepped with Betadine soap and solution and draped in a sterile fashion.  The C-arm fluoroscope was used in AP projection, and the T8-T10 level was identified. The midline of the skin overlying the lower thoracic spine was infiltrated with local anesthetic with epinephrine. Midline incision made, carried down to the subcutaneous tissue. Bipolar cautery and electrocautery used to maintain hemostasis. The thoracic fascia was incised bilaterally, and the parathoracic musculature was dissected from the spinous process and lamina in a subperiosteal fashion. The C-arm fluoroscope was again used to identify the T8, T9, and T10 spinous processes and lamina. The T9 spinous process lamina had a very abnormal appearance, with a roughened cortical surface. We then proceeded with a  inferior T8, complete T9, and a superior T10 laminectomy. The T9 spinous process and lamina were densely sclerotic and hard. The laminectomy was performed using double-action rongeurs, the high-speed drill, and Kerrison punches. Specimens of the T9 spinous process and lamina were sent as one specimen, and then the thinned inner laminar surface and epidural tumor was sent as a second specimen. At the level of T9 there was found to be significant canal stenosis, that was decompressed by performing a laminectomy, removing the thin layer of associated epidural tumor. Good decompression of the spinal canal and thecal sac was achieved. Hemostasis was established the use of bipolar cautery, Gelfoam with thrombin, and Surgifoam.  Once hemostasis was established, we proceeded with closure. Paraspinal muscles was approximated with interrupted undyed 1 Vicryl sutures. Thoracic fascia was approximated with interrupted undyed 1 Vicryl sutures. Scarpa's fascia was closed with interrupted inverted 0 undyed Vicryl sutures.  The subcutaneous and subcuticular closed with interrupted inverted 2-0 Vicryl sutures. Skin is approximate surgical staples.  An Aquacel dressing was applied. The patient was then turned to a supine position, to be reversed from the anesthetic, extubated, and transferred to the recovery room for further care.  PLAN OF CARE: patient is to return to the 3 S. unit after initial postoperative care in the PACU  PATIENT DISPOSITION:  PACU - hemodynamically stable.   Delay start of Pharmacological VTE agent (>24hrs) due to surgical blood loss or risk of bleeding:  yes

## 2014-01-29 NOTE — Progress Notes (Signed)
Subjective: Patient resting comfortably in bed, had an early breakfast today, now nothing by mouth. Overall more comfortable. Reports that he ambulated well yesterday in the ICU. He reports that lower extremities no longer feel heavy.  Objective: Vital signs in last 24 hours: Filed Vitals:   01/28/14 1643 01/28/14 2100 01/28/14 2317 01/29/14 0359  BP:  136/66 144/73 138/68  Pulse: 96 90 94 83  Temp: 98 F (36.7 C) 98.1 F (36.7 C) 97.7 F (36.5 C) 97.8 F (36.6 C)  TempSrc: Oral Oral Oral Oral  Resp: 18 22 28 27   Height:      Weight:      SpO2: 97% 96% 98% 100%    Intake/Output from previous day: 11/11 0701 - 11/12 0700 In: 240 [P.O.:240] Out: 950 [Urine:950] Intake/Output this shift:    Physical Exam:  Awake alert, oriented. Following commands. Moving all extremities, but with mild proximal lower extremity weakness.  CBC  Recent Labs  01/26/14 1647 01/26/14 1809 01/29/14 0235  WBC 8.1  --  17.5*  HGB 10.1* 10.2* 9.5*  HCT 33.2* 30.0* 31.4*  PLT 335  --  389   BMET  Recent Labs  01/28/14 1250 01/29/14 0235  NA 140 140  K 3.6* 4.0  CL 100 101  CO2 25 23  GLUCOSE 183* 192*  BUN 16 21  CREATININE 0.72 0.76  CALCIUM 8.5 8.5    Studies/Results: Mr Thoracic Spine W Wo Contrast  01/28/2014   CLINICAL DATA:  Metastatic prostate cancer to the thoracic and lumbar spine. Spinal stenosis.  EXAM: MRI THORACIC AND LUMBAR SPINE WITHOUT AND WITH CONTRAST  TECHNIQUE: Multiplanar and multiecho pulse sequences of the thoracic and lumbar spine were obtained without and with intravenous contrast.  CONTRAST:  38mL MULTIHANCE GADOBENATE DIMEGLUMINE 529 MG/ML IV SOLN  COMPARISON:  MRI of the thoracic and lumbar spine without contrast 01/26/2014.  FINDINGS: MR THORACIC SPINE FINDINGS  Normal signal is present in the thoracic spinal cord. Diffuse metastatic disease is present. There is tumor infiltration at T6 and diffusely on the left at T8, T9, and T10. There is extraosseous  tumor extension at the T9 level to the left greater than right. There is significant compression of the central spinal cord at the T9 level is previously documented. Osseous foraminal narrowing bilaterally is worse on the left at T9-10. There is mild foraminal narrowing bilaterally at T8-9 and T10-11 due to tumor encroachment. Tumor extends into the pedicles bilaterally at T8, T9, and T10. There is tumor in the left portion of the vertebral body at T7 and T11. A 14 mm lesion is present within the right aspect vertebral body at T12.  The postcontrast images demonstrate enhancement of tumor within the bone as described. There is additionally dural enhancement and tumor infiltration diffusely from T8-9 through T10-11. The axial images are somewhat distorted by patient motion particularly through this area. The extraosseous soft tissue at the T9 level demonstrates enhancement as well.  MR LUMBAR SPINE FINDINGS  The postcontrast images of lumbar spine are significantly degraded by patient motion.  Tumor infiltration is present within the posterior vertebral body at L2 without significant extraosseous tumor. Chronic endplate marrow changes are present at L5-S1. No other definite tumor is present. Limited imaging of the abdomen is unremarkable. There is no significant adenopathy.  L1-2: Leftward disc bulging and mild facet hypertrophy are present without significant stenosis.  L2-3: A leftward disc protrusion is present. Moderate facet hypertrophy is evident. This results in mild to moderate left subarticular  and foraminal narrowing. Right subarticular and foraminal narrowing is mild.  L3-4: A broad-based disc protrusion and moderate facet hypertrophy results an moderate subarticular and foraminal narrowing bilaterally, slightly worse on the right.  L4-5: Severe central canal stenosis is due to a broad-based disc protrusion and advanced facet hypertrophy. Moderate left and mild to moderate right foraminal stenosis is  present.  L5-S1: A rightward disc protrusion is present. Moderate facet hypertrophy is noted. This results in moderate right subarticular stenosis and mild left subarticular narrowing. Mild to moderate foraminal narrowing is worse on the right.  IMPRESSION: 1. Moderate to severe central canal stenosis at T9 secondary to circumferential tumor involving the T8, T9, and T10 vertebral bodies. 2. Extraosseous tumor on the left at T9. 3. Dural-based tumor extends from T8-9 through T10-11, worse on the left. 4. Foraminal narrowing is present bilaterally from T8-9 through T10-11. It is greatest at T9-10. 5. Tumor within the posterior body of L2 without collapse or extraosseous extension. 6. Spondylosis of the lumbar spine is unrelated to tumor. 7. Mild to moderate left subarticular and foraminal narrowing at L2-3. 8. Moderate subarticular and foraminal stenosis bilaterally at L3-4 is worse on the right. 9. Severe central canal stenosis at L4-5 secondary to a broad-based disc protrusion and advanced facet hypertrophy. 10. Moderate left and mild to moderate right foraminal narrowing at L4-5. 11. Rightward disc protrusion with right subarticular and bilateral foraminal narrowing at L5-S1.   Electronically Signed   By: Lawrence Santiago M.D.   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 Spinal stenosis.  EXAM: MRI THORACIC AND LUMBAR SPINE WITHOUT AND WITH CONTRAST  TECHNIQUE: Multiplanar and multiecho pulse sequences of the thoracic and lumbar spine were obtained without and with intravenous contrast.  CONTRAST:  73mL MULTIHANCE GADOBENATE DIMEGLUMINE 529 MG/ML IV SOLN  COMPARISON:  MRI of the thoracic and lumbar spine without contrast 01/26/2014.  FINDINGS: MR THORACIC SPINE FINDINGS  Normal signal is present in the thoracic spinal cord. Diffuse metastatic disease is present. There is tumor infiltration at T6 and diffusely on  the left at T8, T9, and T10. There is extraosseous tumor extension at the T9 level to the left greater than right. There is significant compression of the central spinal cord at the T9 level is previously documented. Osseous foraminal narrowing bilaterally is worse on the left at T9-10. There is mild foraminal narrowing bilaterally at T8-9 and T10-11 due to tumor encroachment. Tumor extends into the pedicles bilaterally at T8, T9, and T10. There is tumor in the left portion of the vertebral body at T7 and T11. A 14 mm lesion is present within the right aspect vertebral body at T12.  The postcontrast images demonstrate enhancement of tumor within the bone as described. There is additionally dural enhancement and tumor infiltration diffusely from T8-9 through T10-11. The axial images are somewhat distorted by patient motion particularly through this area. The extraosseous soft tissue at the T9 level demonstrates enhancement as well.  MR LUMBAR SPINE FINDINGS  The postcontrast images of lumbar spine are significantly degraded by patient motion.  Tumor infiltration is present within the posterior vertebral body at L2 without significant extraosseous tumor. Chronic endplate marrow changes are present at L5-S1. No other definite tumor is present. Limited imaging of the abdomen is unremarkable. There is no significant adenopathy.  L1-2: Leftward disc bulging and mild facet hypertrophy are present without significant  stenosis.  L2-3: A leftward disc protrusion is present. Moderate facet hypertrophy is evident. This results in mild to moderate left subarticular and foraminal narrowing. Right subarticular and foraminal narrowing is mild.  L3-4: A broad-based disc protrusion and moderate facet hypertrophy results an moderate subarticular and foraminal narrowing bilaterally, slightly worse on the right.  L4-5: Severe central canal stenosis is due to a broad-based disc protrusion and advanced facet hypertrophy. Moderate left and  mild to moderate right foraminal stenosis is present.  L5-S1: A rightward disc protrusion is present. Moderate facet hypertrophy is noted. This results in moderate right subarticular stenosis and mild left subarticular narrowing. Mild to moderate foraminal narrowing is worse on the right.  IMPRESSION: 1. Moderate to severe central canal stenosis at T9 secondary to circumferential tumor involving the T8, T9, and T10 vertebral bodies. 2. Extraosseous tumor on the left at T9. 3. Dural-based tumor extends from T8-9 through T10-11, worse on the left. 4. Foraminal narrowing is present bilaterally from T8-9 through T10-11. It is greatest at T9-10. 5. Tumor within the posterior body of L2 without collapse or extraosseous extension. 6. Spondylosis of the lumbar spine is unrelated to tumor. 7. Mild to moderate left subarticular and foraminal narrowing at L2-3. 8. Moderate subarticular and foraminal stenosis bilaterally at L3-4 is worse on the right. 9. Severe central canal stenosis at L4-5 secondary to a broad-based disc protrusion and advanced facet hypertrophy. 10. Moderate left and mild to moderate right foraminal narrowing at L4-5. 11. Rightward disc protrusion with right subarticular and bilateral foraminal narrowing at L5-S1.   Electronically Signed   By: Lawrence Santiago M.D.   On: 01/28/2014 21:13    Assessment/Plan: For surgery for thoracic laminectomy for tumor resection and spinal cord decompression later today.   Hosie Spangle, MD 01/29/2014, 7:28 AM

## 2014-01-29 NOTE — Anesthesia Procedure Notes (Addendum)
Procedure Name: Intubation Date/Time: 01/29/2014 3:46 PM Performed by: Carola Frost Pre-anesthesia Checklist: Patient identified, Timeout performed, Emergency Drugs available, Suction available and Patient being monitored Patient Re-evaluated:Patient Re-evaluated prior to inductionOxygen Delivery Method: Circle system utilized Preoxygenation: Pre-oxygenation with 100% oxygen Intubation Type: IV induction Ventilation: Mask ventilation without difficulty Laryngoscope Size: Mac and 4 Grade View: Grade IV Tube type: Oral Tube size: 8.0 mm Number of attempts: 1 Airway Equipment and Method: Stylet Placement Confirmation: CO2 detector,  positive ETCO2,  ETT inserted through vocal cords under direct vision and breath sounds checked- equal and bilateral Secured at: 24 cm Tube secured with: Tape Dental Injury: Teeth and Oropharynx as per pre-operative assessment  Difficulty Due To: Difficulty was anticipated Future Recommendations: Recommend- induction with short-acting agent, and alternative techniques readily available

## 2014-01-30 ENCOUNTER — Other Ambulatory Visit: Payer: Self-pay | Admitting: Radiation Therapy

## 2014-01-30 ENCOUNTER — Ambulatory Visit
Admit: 2014-01-30 | Discharge: 2014-01-30 | Disposition: A | Discharge status: Home / Self Care | Payer: Medicare Other | Attending: Radiation Oncology | Admitting: Radiation Oncology

## 2014-01-30 DIAGNOSIS — C7949 Secondary malignant neoplasm of other parts of nervous system: Secondary | ICD-10-CM

## 2014-01-30 LAB — BASIC METABOLIC PANEL
Anion gap: 14 (ref 5–15)
BUN: 28 mg/dL — ABNORMAL HIGH (ref 6–23)
CO2: 25 mEq/L (ref 19–32)
Calcium: 7.7 mg/dL — ABNORMAL LOW (ref 8.4–10.5)
Chloride: 102 mEq/L (ref 96–112)
Creatinine, Ser: 0.89 mg/dL (ref 0.50–1.35)
GFR calc Af Amer: >90 mL/min (ref >90)
GFR calc non Af Amer: 83 mL/min — ABNORMAL LOW (ref >90)
Glucose, Bld: 171 mg/dL — ABNORMAL HIGH (ref 70–99)
Potassium: 5.4 mEq/L — ABNORMAL HIGH (ref 3.7–5.3)
Sodium: 141 mEq/L (ref 137–147)

## 2014-01-30 LAB — CBC WITH DIFFERENTIAL/PLATELET
Basophils Absolute: 0 10*3/uL (ref 0.0–0.1)
Basophils Relative: 0 % (ref 0–1)
Eosinophils Absolute: 0 10*3/uL (ref 0.0–0.7)
Eosinophils Relative: 0 % (ref 0–5)
HCT: 34 % — ABNORMAL LOW (ref 39.0–52.0)
Hemoglobin: 10.2 g/dL — ABNORMAL LOW (ref 13.0–17.0)
Lymphocytes Relative: 4 % — ABNORMAL LOW (ref 12–46)
Lymphs Abs: 0.6 10*3/uL — ABNORMAL LOW (ref 0.7–4.0)
MCH: 25.1 pg — ABNORMAL LOW (ref 26.0–34.0)
MCHC: 30 g/dL (ref 30.0–36.0)
MCV: 83.7 fL (ref 78.0–100.0)
Monocytes Absolute: 0.5 10*3/uL (ref 0.1–1.0)
Monocytes Relative: 3 % (ref 3–12)
Neutro Abs: 15.8 10*3/uL — ABNORMAL HIGH (ref 1.7–7.7)
Neutrophils Relative %: 93 % — ABNORMAL HIGH (ref 43–77)
Platelets: 322 10*3/uL (ref 150–400)
RBC: 4.06 MIL/uL — ABNORMAL LOW (ref 4.22–5.81)
RDW: 18.1 % — ABNORMAL HIGH (ref 11.5–15.5)
WBC: 17 10*3/uL — ABNORMAL HIGH (ref 4.0–10.5)

## 2014-01-30 LAB — GLUCOSE, CAPILLARY
GLUCOSE-CAPILLARY: 243 mg/dL — AB (ref 70–99)
Glucose-Capillary: 154 mg/dL — ABNORMAL HIGH (ref 70–99)
Glucose-Capillary: 168 mg/dL — ABNORMAL HIGH (ref 70–99)
Glucose-Capillary: 233 mg/dL — ABNORMAL HIGH (ref 70–99)

## 2014-01-30 MED ORDER — ONDANSETRON HCL 4 MG/2ML IJ SOLN
4.0000 mg | Freq: Four times a day (QID) | INTRAMUSCULAR | Status: DC | PRN
Start: 2014-01-30 — End: 2014-02-04
  Administered 2014-01-30: 4 mg via INTRAVENOUS
  Filled 2014-01-30: qty 2

## 2014-01-30 MED ORDER — PROMETHAZINE HCL 25 MG/ML IJ SOLN
12.5000 mg | Freq: Four times a day (QID) | INTRAMUSCULAR | Status: DC | PRN
Start: 2014-01-30 — End: 2014-02-04

## 2014-01-30 MED ORDER — DEXAMETHASONE SODIUM PHOSPHATE 4 MG/ML IJ SOLN: 4.0000 mg | Freq: Two times a day (BID) | INTRAMUSCULAR | Status: DC

## 2014-01-30 MED ORDER — DEXAMETHASONE SODIUM PHOSPHATE 4 MG/ML IJ SOLN: 4.0000 mg | Freq: Four times a day (QID) | INTRAMUSCULAR | Status: DC

## 2014-01-30 MED ORDER — DEXAMETHASONE SODIUM PHOSPHATE 4 MG/ML IJ SOLN
6.0000 mg | Freq: Four times a day (QID) | INTRAMUSCULAR | Status: AC
  Administered 2014-01-31 – 2014-02-02 (×8): 6 mg via INTRAVENOUS
  Filled 2014-01-30 (×2): qty 1.5
  Filled 2014-01-30: qty 2
  Filled 2014-01-30: qty 1.5
  Filled 2014-01-30 (×2): qty 2
  Filled 2014-01-30: qty 1.5
  Filled 2014-01-30 (×2): qty 2
  Filled 2014-01-30: qty 1.5

## 2014-01-30 MED ORDER — SODIUM CHLORIDE 0.9 % IV SOLN
INTRAVENOUS | Status: DC
Start: 2014-01-30 — End: 2014-01-31
  Administered 2014-01-30 (×2): via INTRAVENOUS

## 2014-01-30 MED ORDER — MORPHINE SULFATE 2 MG/ML IJ SOLN: 1.0000 mg | INTRAMUSCULAR | Status: DC | PRN

## 2014-01-30 NOTE — Progress Notes (Addendum)
This RN entered pt's room and pt was pulling on O2 sat probe and IV tubing. When asked what was wrong pt was unable to give comprehensible response. Pt initially unable to follow commands to to squeeze this RN's fingers, to wiggle pt's own toes, or to state time, place or situation. Pt removed the arterial line that was placed in his left wrist. Dr. Michel Santee, neurosurgery paged to inform of neuro status changes. While awaiting call back from Dr. Michel Santee, pt became able to follow all commands and answer orientation questions appropriately. VS stable, no s/s of acute distress noted.   0245: Dr. Michel Santee aware of event. No new orders at this time. No s/s of acute distress noted.

## 2014-01-30 NOTE — Progress Notes (Signed)
Medicare Important Message given? YES  (If response is "NO", the following Medicare IM given date fields will be blank)  Date Medicare IM given: 01/30/14 Medicare IM given by:  Dahlia Client [employer]

## 2014-01-30 NOTE — Progress Notes (Signed)
[facility] TEAM 1 - Stepdown/ICU TEAM Progress Note  Clarence Pollard XLK:[id number] DOB: 17-Jun-1940 DOA: 01/26/2014 PCP: [name], MD  Admit HPI / Brief Narrative: 73 y.o. 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 Symptom onset was sudden and progressively worsening. No urinary or bowel symptoms (retention or incontinence). Endorsed worsening of ascending BLE numbness up to hips.  In the ED spinal cord compression was seen on MRI, no spinal cord edema at this time.  HPI/Subjective: Doing well post-op.  No new complaints.  Denies current pain.  No sob or n/v.    Assessment/Plan:  Spinal cord compression Secondary to metastatic prostate cancer - patient s/p neurosurgical debulking of tumor on 11/12 - per notes does not appear to be candidate for further radiation treatment - awaiting postoperative recommendations from Oncology - Decadron taper per NS   Hypokalemia Resolved w/ replacement - Mg normal - recheck in AM as K now actually modestly elevated   Insomnia Continue trazodone 25 mg QHS  Code Status: FULL Family Communication: no family present at time of exam Disposition Plan: SDU  Consultants: Dr. Burney Gauze (oncology) Dr. Jovita Gamma (neurosurgery)  Procedures: 11/12 -T8-T10 thoracic laminectomy and resection of epidural tumor, with decompression of the spinal canal and spinal cord  Antibiotics: NA  DVT prophylaxis: SCD  Objective: Blood pressure 105/64, pulse 79, temperature 97.5 F (36.4 C), temperature source Oral, resp. rate 17, height 6' (1.829 m), weight 116.3 kg (256 lb 6.3 oz), SpO2 95 %.  Intake/Output Summary (Last 24 hours) at 01/30/14 1757 Last data filed at 01/30/14 1626  Gross per 24 hour  Intake   2105 ml  Output    730 ml  Net   1375 ml   Exam: General:  No acute respiratory distress Lungs: Clear to auscultation bilaterally without wheezes or crackles Cardiovascular: Regular rate and rhythm without murmur gallop or rub normal S1 and S2 Abdomen: Nontender, nondistended, soft, bowel sounds positive, no rebound, no ascites, no appreciable mass Extremities: No significant cyanosis, clubbing, edema bilateral lower extremities  Data Reviewed: Basic Metabolic Panel:  Recent Labs Lab 01/26/14 1647 01/26/14 1809 01/28/14 1250 01/29/14 0235 01/30/14 0210  NA 141 143 140 140 141  K 3.5* 3.4* 3.6* 4.0 5.4*  CL 103 107 100 101 102  CO2 23  --  25 23 25   GLUCOSE 92 94 183* 192* 171*  BUN 11 8 16 21  28*  CREATININE 0.77 0.80  0.80 0.72 0.76 0.89  CALCIUM 8.8  --  8.5 8.5 7.7*  MG  --   --   --  2.1  --    Liver Function Tests:  Recent Labs Lab 01/26/14 1647  AST 22  ALT 14  ALKPHOS 183*  BILITOT 0.5  PROT 9.2*  ALBUMIN 3.9   CBC:  Recent Labs Lab 01/26/14 1647 01/26/14 1809 01/29/14 0235 01/30/14 0210  WBC 8.1  --  17.5* 17.0*  NEUTROABS 5.4  --   --  15.8*  HGB 10.1* 10.2* 9.5* 10.2*  HCT 33.2* 30.0* 31.4* 34.0*  MCV 79.6  --  79.5 83.7  PLT 335  --  389 322    Recent Results (from the past 240 hour(s))  MRSA PCR Screening     Status: None   Collection Time: 01/27/14  2:39 AM  Result  Value Ref Range Status   MRSA by PCR NEGATIVE NEGATIVE Final    Comment:        The GeneXpert MRSA Assay (FDA approved for NASAL specimens only), is one component of a comprehensive MRSA colonization surveillance program. It is not intended to diagnose MRSA infection nor to guide or monitor treatment for MRSA infections.      Studies:  Recent x-ray studies have been reviewed in detail by the Attending Physician  Scheduled Meds:  Scheduled Meds: . baclofen  10 mg Oral BID  . benztropine  0.5 mg Oral Q12H  . bicalutamide  50 mg Oral Daily  . brimonidine  1 drop Both Eyes QHS  . dexamethasone  10 mg Intravenous 4 times  per day  . [START ON 02/02/2014] dexamethasone  4 mg Intravenous 4 times per day  . [START ON 02/04/2014] dexamethasone  4 mg Intravenous Q12H  . [START ON 01/31/2014] dexamethasone  6 mg Intravenous 4 times per day  . ferrous sulfate  325 mg Oral QHS  . insulin aspart  0-9 Units Subcutaneous TID WC  . latanoprost  1 drop Both Eyes QHS  . pantoprazole  40 mg Oral Q1200    Time spent on care of this patient: 25 mins   [facility] T , MD   [facility] Office  [phone] Pager - [phone]  On-Call/Text Page:      [email or web address]      password [email or web address]  If 7PM-7AM, please contact night-coverage [email or web address] Password [email or web address] 01/30/2014, 5:57 PM   LOS: 4 days

## 2014-01-30 NOTE — Progress Notes (Signed)
Subjective: Patient resting in bed comfortably. Maintained CPAP through the night.  Did have a period of confusion overnight, and pulled out his aline.  Objective: Vital signs in last 24 hours: Filed Vitals:   01/29/14 2352 01/30/14 0000 01/30/14 0300 01/30/14 0321  BP:  118/59 109/60   Pulse:  83 73 69  Temp: 97.8 F (36.6 C)   97.6 F (36.4 C)  TempSrc: Axillary   Axillary  Resp:  16 16 17   Height:      Weight:      SpO2:  97% 100% 100%    Intake/Output from previous day: 11/12 0701 - 11/13 0700 In: 1855 [I.V.:1520; Blood:335] Out: 1180 [Urine:680; Blood:500] Intake/Output this shift:    Physical Exam:  Lethargic, but awakens to voice. Fully oriented. Following commands. Moving all 4 extremities well. Dressing clean and dry.  CBC  Recent Labs  01/29/14 0235 01/30/14 0210  WBC 17.5* 17.0*  HGB 9.5* 10.2*  HCT 31.4* 34.0*  PLT 389 322   BMET  Recent Labs  01/29/14 0235 01/30/14 0210  NA 140 141  K 4.0 5.4*  CL 101 102  CO2 23 25  GLUCOSE 192* 171*  BUN 21 28*  CREATININE 0.76 0.89  CALCIUM 8.5 7.7*    Studies/Results: Mr Thoracic Spine W Wo Contrast  01/28/2014   CLINICAL DATA:  Metastatic prostate cancer to the thoracic and lumbar spine. Spinal stenosis.  EXAM: MRI THORACIC AND LUMBAR SPINE WITHOUT AND WITH CONTRAST  TECHNIQUE: Multiplanar and multiecho pulse sequences of the thoracic and lumbar spine were obtained without and with intravenous contrast.  CONTRAST:  8mL MULTIHANCE GADOBENATE DIMEGLUMINE 529 MG/ML IV SOLN  COMPARISON:  MRI of the thoracic and lumbar spine without contrast 01/26/2014.  FINDINGS: MR THORACIC SPINE FINDINGS  Normal signal is present in the thoracic spinal cord. Diffuse metastatic disease is present. There is tumor infiltration at T6 and diffusely on the left at T8, T9, and T10. There is extraosseous tumor extension at the T9 level to the left greater than right. There is significant compression of the central spinal cord at the  T9 level is previously documented. Osseous foraminal narrowing bilaterally is worse on the left at T9-10. There is mild foraminal narrowing bilaterally at T8-9 and T10-11 due to tumor encroachment. Tumor extends into the pedicles bilaterally at T8, T9, and T10. There is tumor in the left portion of the vertebral body at T7 and T11. A 14 mm lesion is present within the right aspect vertebral body at T12.  The postcontrast images demonstrate enhancement of tumor within the bone as described. There is additionally dural enhancement and tumor infiltration diffusely from T8-9 through T10-11. The axial images are somewhat distorted by patient motion particularly through this area. The extraosseous soft tissue at the T9 level demonstrates enhancement as well.  MR LUMBAR SPINE FINDINGS  The postcontrast images of lumbar spine are significantly degraded by patient motion.  Tumor infiltration is present within the posterior vertebral body at L2 without significant extraosseous tumor. Chronic endplate marrow changes are present at L5-S1. No other definite tumor is present. Limited imaging of the abdomen is unremarkable. There is no significant adenopathy.  L1-2: Leftward disc bulging and mild facet hypertrophy are present without significant stenosis.  L2-3: A leftward disc protrusion is present. Moderate facet hypertrophy is evident. This results in mild to moderate left subarticular and foraminal narrowing. Right subarticular and foraminal narrowing is mild.  L3-4: A broad-based disc protrusion and moderate facet hypertrophy results an moderate  subarticular and foraminal narrowing bilaterally, slightly worse on the right.  L4-5: Severe central canal stenosis is due to a broad-based disc protrusion and advanced facet hypertrophy. Moderate left and mild to moderate right foraminal stenosis is present.  L5-S1: A rightward disc protrusion is present. Moderate facet hypertrophy is noted. This results in moderate right  subarticular stenosis and mild left subarticular narrowing. Mild to moderate foraminal narrowing is worse on the right.  IMPRESSION: 1. Moderate to severe central canal stenosis at T9 secondary to circumferential tumor involving the T8, T9, and T10 vertebral bodies. 2. Extraosseous tumor on the left at T9. 3. Dural-based tumor extends from T8-9 through T10-11, worse on the left. 4. Foraminal narrowing is present bilaterally from T8-9 through T10-11. It is greatest at T9-10. 5. Tumor within the posterior body of L2 without collapse or extraosseous extension. 6. Spondylosis of the lumbar spine is unrelated to tumor. 7. Mild to moderate left subarticular and foraminal narrowing at L2-3. 8. Moderate subarticular and foraminal stenosis bilaterally at L3-4 is worse on the right. 9. Severe central canal stenosis at L4-5 secondary to a broad-based disc protrusion and advanced facet hypertrophy. 10. Moderate left and mild to moderate right foraminal narrowing at L4-5. 11. Rightward disc protrusion with right subarticular and bilateral foraminal narrowing at L5-S1.   Electronically Signed   By: Lawrence Santiago M.D.   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 Spinal stenosis.  EXAM: MRI THORACIC AND LUMBAR SPINE WITHOUT AND WITH CONTRAST  TECHNIQUE: Multiplanar and multiecho pulse sequences of the thoracic and lumbar spine were obtained without and with intravenous contrast.  CONTRAST:  60mL MULTIHANCE GADOBENATE DIMEGLUMINE 529 MG/ML IV SOLN  COMPARISON:  MRI of the thoracic and lumbar spine without contrast 01/26/2014.  FINDINGS: MR THORACIC SPINE FINDINGS  Normal signal is present in the thoracic spinal cord. Diffuse metastatic disease is present. There is tumor infiltration at T6 and diffusely on the left at T8, T9, and T10. There is extraosseous tumor extension at the T9 level to the left greater than right. There is  significant compression of the central spinal cord at the T9 level is previously documented. Osseous foraminal narrowing bilaterally is worse on the left at T9-10. There is mild foraminal narrowing bilaterally at T8-9 and T10-11 due to tumor encroachment. Tumor extends into the pedicles bilaterally at T8, T9, and T10. There is tumor in the left portion of the vertebral body at T7 and T11. A 14 mm lesion is present within the right aspect vertebral body at T12.  The postcontrast images demonstrate enhancement of tumor within the bone as described. There is additionally dural enhancement and tumor infiltration diffusely from T8-9 through T10-11. The axial images are somewhat distorted by patient motion particularly through this area. The extraosseous soft tissue at the T9 level demonstrates enhancement as well.  MR LUMBAR SPINE FINDINGS  The postcontrast images of lumbar spine are significantly degraded by patient motion.  Tumor infiltration is present within the posterior vertebral body at L2 without significant extraosseous tumor. Chronic endplate marrow changes are present at L5-S1. No other definite tumor is present. Limited imaging of the abdomen is unremarkable. There is no significant adenopathy.  L1-2: Leftward disc bulging and mild facet hypertrophy are present without significant stenosis.  L2-3: A leftward disc protrusion is present. Moderate facet hypertrophy is evident. This results in mild to moderate left subarticular and  foraminal narrowing. Right subarticular and foraminal narrowing is mild.  L3-4: A broad-based disc protrusion and moderate facet hypertrophy results an moderate subarticular and foraminal narrowing bilaterally, slightly worse on the right.  L4-5: Severe central canal stenosis is due to a broad-based disc protrusion and advanced facet hypertrophy. Moderate left and mild to moderate right foraminal stenosis is present.  L5-S1: A rightward disc protrusion is present. Moderate facet  hypertrophy is noted. This results in moderate right subarticular stenosis and mild left subarticular narrowing. Mild to moderate foraminal narrowing is worse on the right.  IMPRESSION: 1. Moderate to severe central canal stenosis at T9 secondary to circumferential tumor involving the T8, T9, and T10 vertebral bodies. 2. Extraosseous tumor on the left at T9. 3. Dural-based tumor extends from T8-9 through T10-11, worse on the left. 4. Foraminal narrowing is present bilaterally from T8-9 through T10-11. It is greatest at T9-10. 5. Tumor within the posterior body of L2 without collapse or extraosseous extension. 6. Spondylosis of the lumbar spine is unrelated to tumor. 7. Mild to moderate left subarticular and foraminal narrowing at L2-3. 8. Moderate subarticular and foraminal stenosis bilaterally at L3-4 is worse on the right. 9. Severe central canal stenosis at L4-5 secondary to a broad-based disc protrusion and advanced facet hypertrophy. 10. Moderate left and mild to moderate right foraminal narrowing at L4-5. 11. Rightward disc protrusion with right subarticular and bilateral foraminal narrowing at L5-S1.   Electronically Signed   By: Lawrence Santiago M.D.   On: 01/28/2014 21:13   Dg Thoracic Spine 1 View  01/29/2014   CLINICAL DATA:  Patient with metastatic prostate carcinoma resulting in spinal stenosis. Tumor removal.  EXAM: DG C-ARM 61-120 MIN; THORACIC SPINE - 1 VIEW  COMPARISON:  MRI thoracic spine 01/28/2014.  FINDINGS: We are provided with a single intraoperative fluoroscopic spot view of the thoracic spine in the AP projection. Image demonstrates tissues spreaders and probes in place for tumor resection.  IMPRESSION: Thoracic spine tumor resection in progress.   Electronically Signed   By: Inge Rise M.D.   On: 01/29/2014 23:02   Dg Chest Port 1 View  01/29/2014   CLINICAL DATA:  Central line placement.  EXAM: PORTABLE CHEST - 1 VIEW  COMPARISON:  None.  FINDINGS: Mildly enlarged cardiac  silhouette. Mild bibasilar atelectasis. Right jugular catheter tip in the superior vena cava. No pneumothorax. Thoracic spine degenerative changes.  IMPRESSION: 1. Right jugular catheter tip in the superior vena cava without pneumothorax. 2. Mild cardiomegaly and mild bibasilar atelectasis.   Electronically Signed   By: Enrique Sack M.D.   On: 01/29/2014 19:46   Dg C-arm 1-60 Min  01/29/2014   CLINICAL DATA:  Patient with metastatic prostate carcinoma resulting in spinal stenosis. Tumor removal.  EXAM: DG C-ARM 61-120 MIN; THORACIC SPINE - 1 VIEW  COMPARISON:  MRI thoracic spine 01/28/2014.  FINDINGS: We are provided with a single intraoperative fluoroscopic spot view of the thoracic spine in the AP projection. Image demonstrates tissues spreaders and probes in place for tumor resection.  IMPRESSION: Thoracic spine tumor resection in progress.   Electronically Signed   By: Inge Rise M.D.   On: 01/29/2014 23:02    Assessment/Plan: Doing well following thoracic laminectomy and resection of epidural tumor, and decompression of the spinal cord.  Spoke with patient's nurse about mobilizing him to chair, for meals today, and progressing to ambulation; also discussed DC Foley once up and about and decreasing IVF to 50 mL/h once taking well by  mouth.  I suspect the patient has undiagnosed sleep apnea. I discussed this briefly with his daughter Amy last night and his other daughter Gilmore Laroche this morning, and they suspect it is as well.  We'll defer to the [facility] service to assess further, and refer for outpatient evaluation. Patient may need continued CPAP support at night.  Patient still not seen in medical oncology follow-up by his treating medical oncologist Dr. Alen Blew.  He will certainly need adjustments to his systemic treatment of his prostate cancer, and may also be a candidate for stereotactic radiosurgery to this area of recurrent disease in the thoracic spine.  I have preliminarily  discussed SRS with Dr. Kyung Rudd from radiation oncology.  We will begin to taper his Decadron in the morning, and continue to gradually taper it.we will have his dressing removed tomorrow, and leave his wound open to air subsequently.   Hosie Spangle, MD 01/30/2014, 7:13 AM

## 2014-01-31 DIAGNOSIS — C7951 Secondary malignant neoplasm of bone: Secondary | ICD-10-CM

## 2014-01-31 LAB — BASIC METABOLIC PANEL
Anion gap: 14 (ref 5–15)
BUN: 24 mg/dL — AB (ref 6–23)
CO2: 26 meq/L (ref 19–32)
CREATININE: 0.73 mg/dL (ref 0.50–1.35)
Calcium: 7.1 mg/dL — ABNORMAL LOW (ref 8.4–10.5)
Chloride: 99 mEq/L (ref 96–112)
GFR calc Af Amer: >90 mL/min (ref >90)
GFR calc non Af Amer: >90 mL/min (ref >90)
GLUCOSE: 152 mg/dL — AB (ref 70–99)
Potassium: 4.5 mEq/L (ref 3.7–5.3)
Sodium: 139 mEq/L (ref 137–147)

## 2014-01-31 LAB — TYPE AND SCREEN
ABO/RH(D): O POS
Antibody Screen: NEGATIVE
Unit division: 0
Unit division: 0

## 2014-01-31 LAB — GLUCOSE, CAPILLARY
GLUCOSE-CAPILLARY: 176 mg/dL — AB (ref 70–99)
GLUCOSE-CAPILLARY: 206 mg/dL — AB (ref 70–99)
GLUCOSE-CAPILLARY: 163 mg/dL — AB (ref 70–99)
Glucose-Capillary: 143 mg/dL — ABNORMAL HIGH (ref 70–99)

## 2014-01-31 NOTE — Progress Notes (Signed)
[place] TEAM 1 - Stepdown/ICU TEAM Progress Note  Clarence Pollard PTW:[id number] DOB: 1940/08/24 DOA: 01/26/2014 PCP: [name], MD  Admit HPI / Brief Narrative: 73 y.o. 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 Symptom onset was sudden and progressively worsening. No urinary or bowel symptoms (retention or incontinence). Endorsed worsening of ascending BLE numbness up to hips.  In the ED spinal cord compression was seen on MRI.  Following his admission, high dose steroid tx was initiated.  NS evaluated the pt, and took him to the OR on 11/12 where he underwent a T8-T10 thoracic laminectomy and resection of epidural tumor, with decompression of the spinal canal and spinal cord.  Post operatively he has done very well.  He has experienced improved strength and resolution of pain.  He has remained medically stable postoperatively.  It is anticipated that he will require a rehab stay, either in CIR or a SNF, as he lives alone.  NS continues to follow his rehab care.    HPI/Subjective: Alert and oriented w/ no new complaints.  He is moving his bowels and has a good appetite.  No chest pain or sob.  Continues to feel improving strength in his legs.    Assessment/Plan:  Spinal cord compression Secondary to metastatic prostate cancer - patient s/p neurosurgical debulking of tumor on 11/12 - Dr. Sherral Hammers spoke w/ Dr. Alen Blew on 11/12 who recommended outpt f/u to plan possible XRT and change in oral chemo tx  - Decadron taper per NS - wound care and progression of PT/OT at discretion of NS   Hypokalemia Resolved w/ replacement - Mg normal   Insomnia Continue trazodone 25 mg QHS  Hyperglycemia Due to decadron tx - decadron being tapered per NS - SSI prn for now   Code Status: FULL Family Communication: no family  present at time of exam Disposition Plan: medically stable for transfer to neuro bed - PT/OT at discretion of NS - anticipate d/c to CIR or SNF for rehab  Consultants: Dr. Jovita Gamma (neurosurgery)  Procedures: 11/12 -T8-T10 thoracic laminectomy and resection of epidural tumor, with decompression of the spinal canal and spinal cord  Antibiotics: NA  DVT prophylaxis: SCD  Objective: Blood pressure 137/75, pulse 63, temperature 97.9 F (36.6 C), temperature source Oral, resp. rate 13, height 6' (1.829 m), weight 116.3 kg (256 lb 6.3 oz), SpO2 93 %.  Intake/Output Summary (Last 24 hours) at 01/31/14 1118 Last data filed at 01/31/14 0900  Gross per 24 hour  Intake    970 ml  Output   2425 ml  Net  -1455 ml   Exam: General: No acute respiratory distress Lungs: Clear to auscultation bilaterally without wheezes or crackles Cardiovascular: Regular rate and rhythm without murmur gallop or rub  Abdomen: Nontender, nondistended, soft, bowel sounds positive, no rebound, no ascites, no appreciable mass Extremities: No significant cyanosis, clubbing, or edema bilateral lower extremities  Data Reviewed: Basic Metabolic Panel:  Recent Labs Lab 01/26/14 1647 01/26/14 1809 01/28/14 1250 01/29/14 0235 01/30/14 0210 01/31/14 0415  NA 141 143 140 140 141 139  K 3.5* 3.4* 3.6* 4.0 5.4* 4.5  CL 103 107 100 101 102 99  CO2 23  --  25 23 25 26   GLUCOSE 92 94 183* 192* 171* 152*  BUN 11 8 16  21 28* 24*  CREATININE 0.77 0.80  0.80 0.72 0.76 0.89 0.73  CALCIUM 8.8  --  8.5 8.5 7.7* 7.1*  MG  --   --   --  2.1  --   --    Liver Function Tests:  Recent Labs Lab 01/26/14 1647  AST 22  ALT 14  ALKPHOS 183*  BILITOT 0.5  PROT 9.2*  ALBUMIN 3.9   CBC:  Recent Labs Lab 01/26/14 1647 01/26/14 1809 01/29/14 0235 01/30/14 0210  WBC 8.1  --  17.5* 17.0*  NEUTROABS 5.4  --   --  15.8*  HGB 10.1* 10.2* 9.5* 10.2*  HCT 33.2* 30.0* 31.4* 34.0*  MCV 79.6  --  79.5 83.7  PLT  335  --  389 322    Recent Results (from the past 240 hour(s))  MRSA PCR Screening     Status: None   Collection Time: 01/27/14  2:39 AM  Result Value Ref Range Status   MRSA by PCR NEGATIVE NEGATIVE Final    Comment:        The GeneXpert MRSA Assay (FDA approved for NASAL specimens only), is one component of a comprehensive MRSA colonization surveillance program. It is not intended to diagnose MRSA infection nor to guide or monitor treatment for MRSA infections.      Studies:  Recent x-ray studies have been reviewed in detail by the Attending Physician  Scheduled Meds:  Scheduled Meds: . baclofen  10 mg Oral BID  . benztropine  0.5 mg Oral Q12H  . bicalutamide  50 mg Oral Daily  . brimonidine  1 drop Both Eyes QHS  . [START ON 02/02/2014] dexamethasone  4 mg Intravenous 4 times per day  . [START ON 02/04/2014] dexamethasone  4 mg Intravenous Q12H  . dexamethasone  6 mg Intravenous 4 times per day  . ferrous sulfate  325 mg Oral QHS  . insulin aspart  0-9 Units Subcutaneous TID WC  . latanoprost  1 drop Both Eyes QHS  . pantoprazole  40 mg Oral Q1200    Time spent on care of this patient: 25 mins   [facility] T , MD   [facility] Office  [phone] Pager - [phone]  On-Call/Text Page:      [email or web address]      password [email or web address]  If 7PM-7AM, please contact night-coverage [email or web address] Password [email or web address] 01/31/2014, 11:18 AM   LOS: 5 days

## 2014-01-31 NOTE — Progress Notes (Signed)
Pt R IJ d/c per MD order, pt tol well, pt tx to 4N per MD order, pt and family verbalized understanding of d/c, pt updates given at [facility], all questions answered, pt CBG 206

## 2014-01-31 NOTE — Progress Notes (Signed)
BP 137/75 mmHg  Pulse 63  Temp(Src) 97.9 F (36.6 C) (Oral)  Resp 13  Ht 6' (1.829 m)  Wt 116.3 kg (256 lb 6.3 oz)  BMI 34.77 kg/m2  SpO2 93% Alert and oriented x 4, speech is clear and fluent Moving all extremities well, voiding voluntarily Wound is clean, dry, and without signs of infection

## 2014-01-31 NOTE — Progress Notes (Signed)
Received patient to 22 and step down traded out beds. Patient and family oriented to unit and safety plan. All questions answered.

## 2014-01-31 NOTE — Consult Note (Signed)
Radiation Oncology         [phone] ________________________________  Name: Clarence Pollard MRN: [id number]  Date: 01/26/2014  DOB: Aug 02, 1940   Diagnosis:   Metastatic prostate cancer  Interval Since Last Radiation:  The patient completed palliative radiation treatment to the T7-T12 vertebral bodies in August 2014   Narrative:  The patient is seen today as an inpatient. He notes that he began experiencing some increased pain in the lower thoracic spine with radiation around the torso. He also had some difficulty walking. 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 The patient has continued Lupron through urology and also is followed by medical oncology for anti-hormonal treatment as well.  The patient presented to the emergency room with worsening difficulty in terms of ambulation. He was started on Decadron and a neurosurgical consultation was obtained. The patient proceeded with a T8-T10 thoracic laminectomy and resection of epidural tumor. This was completed on 01/29/2014. The patient states that he has done very well. He has been off some walking better and overall he feels that he is recovering very well.                              ALLERGIES:  has No Known Allergies.  Meds: Current Facility-Administered Medications  Medication Dose Route Frequency Provider Last Rate Last Dose  . baclofen (LIORESAL) tablet 10 mg  10 mg Oral BID Hosie Spangle, MD   10 mg at 01/31/14 0998  . benztropine (COGENTIN) tablet 0.5 mg  0.5 mg Oral Q12H Etta Quill, DO   0.5 mg at 01/31/14 3382  . bicalutamide (CASODEX) tablet 50 mg  50 mg Oral Daily Etta Quill, DO   50 mg at 01/31/14 5053  . brimonidine (ALPHAGAN) 0.2 % ophthalmic solution 1 drop  1 drop Both Eyes QHS Rise Patience, MD   1 drop at 01/30/14 2126  . [START ON 02/02/2014] dexamethasone (DECADRON) injection 4 mg  4 mg Intravenous 4 times per day Hosie Spangle, MD       . Derrill Memo ON 02/04/2014] dexamethasone (DECADRON) injection 4 mg  4 mg Intravenous Q12H Hosie Spangle, MD      . dexamethasone (DECADRON) injection 6 mg  6 mg Intravenous 4 times per day Hosie Spangle, MD   6 mg at 01/31/14 1757  . ferrous sulfate tablet 325 mg  325 mg Oral QHS Etta Quill, DO   325 mg at 01/30/14 2126  . ibuprofen (ADVIL,MOTRIN) tablet 600 mg  600 mg Oral BID PRN Etta Quill, DO      . Influenza vac split quadrivalent PF (FLUARIX) injection 0.5 mL  0.5 mL Intramuscular Prior to discharge Allie Bossier, MD      . insulin aspart (novoLOG) injection 0-9 Units  0-9 Units Subcutaneous TID WC Cherene Altes, MD   3 Units at 01/31/14 1756  . latanoprost (XALATAN) 0.005 % ophthalmic solution 1 drop  1 drop Both Eyes QHS Richarda Blade, MD   1 drop at 01/30/14 2126  . morphine 2 MG/ML injection 1-2 mg  1-2 mg Intravenous Q2H PRN Hosie Spangle, MD      . ondansetron [facility]) injection 4 mg  4 mg Intravenous Q6H PRN Cherene Altes, MD   4 mg at 01/30/14 1150  . oxyCODONE-acetaminophen (PERCOCET/ROXICET) 5-325 MG per tablet 1-2 tablet  1-2  tablet Oral Q4H PRN Hosie Spangle, MD   2 tablet at 01/30/14 2036  . pantoprazole (PROTONIX) EC tablet 40 mg  40 mg Oral Q1200 Cherene Altes, MD   40 mg at 01/31/14 1259  . promethazine (PHENERGAN) injection 12.5 mg  12.5 mg Intravenous Q6H PRN Cherene Altes, MD      . traZODone (DESYREL) tablet 25 mg  25 mg Oral QHS PRN Etta Quill, DO        Physical Findings: The patient is in no acute distress. Patient is alert and oriented.  height is 6' (1.829 m) and weight is 256 lb 6.3 oz (116.3 kg). His oral temperature is 98 F (36.7 C). His blood pressure is 145/75 and his pulse is 75. His respiration is 12 and oxygen saturation is 96%. .   General: Well-developed, in no acute distress HEENT: Normocephalic, atraumatic Cardiovascular: Regular rate and rhythm Respiratory: Clear to auscultation bilaterally GI:  Soft, nontender, normal bowel sounds   Lab Findings: Lab Results  Component Value Date   WBC 17.0* 01/30/2014   HGB 10.2* 01/30/2014   HCT 34.0* 01/30/2014   MCV 83.7 01/30/2014   PLT 322 01/30/2014     Radiographic Findings: Mr Thoracic Spine Wo Contrast  01/26/2014   CLINICAL DATA:  Bilateral lower extremity weakness for a few months, increasing over last 5 days. Severe pain with activity. History of metastatic prostate cancer. History of T7 through T11 radiation for prostate metastasis, completed August 2014.  EXAM: MRI THORACIC AND LUMBAR SPINE WITHOUT CONTRAST  TECHNIQUE: Multiplanar and multiecho pulse sequences of the thoracic and lumbar spine were obtained without intravenous contrast. The patient did not wish to receive contrast.  COMPARISON:  MRI of the thoracic spine October 11, 2012  FINDINGS: MR THORACIC SPINE FINDINGS  Worsening diffuse osseous metastasis with low signal presumed sclerotic lesions. Lumbar vertebral bodies appear intact. Bright T1 signal within the T7 through T12 vertebral bodies consistent with radiation change. Minimal STIR signal associated with the low signal lesions consistent with bone marrow edema. No pathologic fracture.  Increasing soft tissue/tumor from T 8 through T10, extending into the epidural space (extending 4.3 cm in craniocaudad dimension) resulting in severe canal stenosis at T9, in trefoil configuration, AP dimension of the thecal sac is 5 mm with cord compression. No convincing evidence of spinal cord edema. Tumor extends into the neural foramen resulting in at least moderate neural foraminal narrowing at T9-10. No syrinx. Soft tissue/tumor extends into the prevertebral soft tissues at T9.  Stranding of the RIGHT lung at the level of T8 through T10 may reflect postradiation change, incompletely characterized. Severe paraspinal muscle atrophy.  MR LUMBAR SPINE FINDINGS  Sagittal T2 sequence not obtained.  Lumbar vertebral bodies appear intact and aligned  with maintenance of lumbar lordosis. Low signal metastasis an L1, and L2, without pathologic fracture. Severe L5-S1 degenerative disc. Severe subacute to chronic discogenic endplate change at O6-V6, mild to moderate chronic discogenic endplate changes at H2-0, L3-4 and L4-5.  Conus medullaris terminates at L1 and appears normal morphology and signal characteristics. Slightly thickened appearance of the cauda equina, for example axial 38/62 could be accentuated by spinal canal is is an redundancy. Prevertebral soft tissues are nonsuspicious. Moderate to severe paraspinal muscle atrophy. Ankylosis of the sacroiliac joints.  Level by level evaluation (limited axial axial T2 sequence due to motion):  L1-2: Small broad-based disc bulge. Moderate facet arthropathy and ligamentum flavum redundancy without canal stenosis or neural foraminal narrowing.  L2-3:  Small broad-based disc bulge with superimposed small bowel subarticular extra foraminal disc protrusion encroaching upon the exited LEFT L2 nerve. Moderate facet arthropathy and ligamentum flavum redundancy. Mild canal stenosis. Mild RIGHT, mild to moderate LEFT neural foraminal narrowing.  L3: Small broad-based disc bulge eccentric laterally may be 6 L3 nerve. Moderate to severe facet arthropathy and ligamentum flavum redundancy with trace first facet effusion which is likely reactive. Moderate canal stenosis. Moderate RIGHT, mild to moderate LEFT neural narrowing.  L4-5: Moderate to large broad-based disc bulge. Severe facet arthropathy and ligamentum flavum with 2 mm bilateral facet effusions which are likely reactive. Severe canal stenosis, AP dimension of the spinal canal is 3 mm. Moderate RIGHT, severe LEFT neural foraminal narrowing.  L5-S1: Moderate to large broad-based disc bulge asymmetric to the RIGHT may encroach upon the exited RIGHT L5 nerve. Partial effacement of RIGHT lateral recess may affect the traversing RIGHT S1 nerve. Moderate facet arthropathy and  ligamentum flavum redundancy without canal stenosis. Moderate to severe RIGHT, moderate LEFT neural foraminal narrowing.  IMPRESSION: MR THORACIC SPINE IMPRESSION  Postradiation changes of the mid thoracic spine with progressed diffuse osseous metastasis, no pathologic fracture.  Increasing epidural confluent tumor from T8 through T10 resulting in severe canal stenosis and cord compression at T9, without spinal cord edema or syrinx.  MR LUMBAR SPINE IMPRESSION  Diffuse osseous metastasis without pathologic fracture or malalignment. Thickened versus redundant cauda equina, limited assessment for leptomeningeal metastasis on this nonenhanced examination.  Lumbar spondylosis.  Severe canal stenosis at L4-5.  Neural foraminal narrowing L2-3 to L5-S1: Severe on the LEFT at L4-5.  Acute findings discussed with and reconfirmed by Dr.ELLIOTT WENTZ on 01/26/2014 at 10:42 pm.   Electronically Signed   By: Elon Alas   On: 01/26/2014 22:37   Mr Lumbar Spine Wo Contrast  01/26/2014   CLINICAL DATA:  Bilateral lower extremity weakness for a few months, increasing over last 5 days. Severe pain with activity. History of metastatic prostate cancer. History of T7 through T11 radiation for prostate metastasis, completed August 2014.  EXAM: MRI THORACIC AND LUMBAR SPINE WITHOUT CONTRAST  TECHNIQUE: Multiplanar and multiecho pulse sequences of the thoracic and lumbar spine were obtained without intravenous contrast. The patient did not wish to receive contrast.  COMPARISON:  MRI of the thoracic spine October 11, 2012  FINDINGS: MR THORACIC SPINE FINDINGS  Worsening diffuse osseous metastasis with low signal presumed sclerotic lesions. Lumbar vertebral bodies appear intact. Bright T1 signal within the T7 through T12 vertebral bodies consistent with radiation change. Minimal STIR signal associated with the low signal lesions consistent with bone marrow edema. No pathologic fracture.  Increasing soft tissue/tumor from T 8 through  T10, extending into the epidural space (extending 4.3 cm in craniocaudad dimension) resulting in severe canal stenosis at T9, in trefoil configuration, AP dimension of the thecal sac is 5 mm with cord compression. No convincing evidence of spinal cord edema. Tumor extends into the neural foramen resulting in at least moderate neural foraminal narrowing at T9-10. No syrinx. Soft tissue/tumor extends into the prevertebral soft tissues at T9.  Stranding of the RIGHT lung at the level of T8 through T10 may reflect postradiation change, incompletely characterized. Severe paraspinal muscle atrophy.  MR LUMBAR SPINE FINDINGS  Sagittal T2 sequence not obtained.  Lumbar vertebral bodies appear intact and aligned with maintenance of lumbar lordosis. Low signal metastasis an L1, and L2, without pathologic fracture. Severe L5-S1 degenerative disc. Severe subacute to chronic discogenic endplate change at G9-J2, mild  to moderate chronic discogenic endplate changes at O7-5, L3-4 and L4-5.  Conus medullaris terminates at L1 and appears normal morphology and signal characteristics. Slightly thickened appearance of the cauda equina, for example axial 38/62 could be accentuated by spinal canal is is an redundancy. Prevertebral soft tissues are nonsuspicious. Moderate to severe paraspinal muscle atrophy. Ankylosis of the sacroiliac joints.  Level by level evaluation (limited axial axial T2 sequence due to motion):  L1-2: Small broad-based disc bulge. Moderate facet arthropathy and ligamentum flavum redundancy without canal stenosis or neural foraminal narrowing.  L2-3: Small broad-based disc bulge with superimposed small bowel subarticular extra foraminal disc protrusion encroaching upon the exited LEFT L2 nerve. Moderate facet arthropathy and ligamentum flavum redundancy. Mild canal stenosis. Mild RIGHT, mild to moderate LEFT neural foraminal narrowing.  L3: Small broad-based disc bulge eccentric laterally may be 6 L3 nerve. Moderate  to severe facet arthropathy and ligamentum flavum redundancy with trace first facet effusion which is likely reactive. Moderate canal stenosis. Moderate RIGHT, mild to moderate LEFT neural narrowing.  L4-5: Moderate to large broad-based disc bulge. Severe facet arthropathy and ligamentum flavum with 2 mm bilateral facet effusions which are likely reactive. Severe canal stenosis, AP dimension of the spinal canal is 3 mm. Moderate RIGHT, severe LEFT neural foraminal narrowing.  L5-S1: Moderate to large broad-based disc bulge asymmetric to the RIGHT may encroach upon the exited RIGHT L5 nerve. Partial effacement of RIGHT lateral recess may affect the traversing RIGHT S1 nerve. Moderate facet arthropathy and ligamentum flavum redundancy without canal stenosis. Moderate to severe RIGHT, moderate LEFT neural foraminal narrowing.  IMPRESSION: MR THORACIC SPINE IMPRESSION  Postradiation changes of the mid thoracic spine with progressed diffuse osseous metastasis, no pathologic fracture.  Increasing epidural confluent tumor from T8 through T10 resulting in severe canal stenosis and cord compression at T9, without spinal cord edema or syrinx.  MR LUMBAR SPINE IMPRESSION  Diffuse osseous metastasis without pathologic fracture or malalignment. Thickened versus redundant cauda equina, limited assessment for leptomeningeal metastasis on this nonenhanced examination.  Lumbar spondylosis.  Severe canal stenosis at L4-5.  Neural foraminal narrowing L2-3 to L5-S1: Severe on the LEFT at L4-5.  Acute findings discussed with and reconfirmed by Dr.ELLIOTT WENTZ on 01/26/2014 at 10:42 pm.   Electronically Signed   By: Elon Alas   On: 01/26/2014 22:37   Mr Thoracic Spine W Wo Contrast  01/28/2014   CLINICAL DATA:  Metastatic prostate cancer to the thoracic and lumbar spine. Spinal stenosis.  EXAM: MRI THORACIC AND LUMBAR SPINE WITHOUT AND WITH CONTRAST  TECHNIQUE: Multiplanar and multiecho pulse sequences of the thoracic and  lumbar spine were obtained without and with intravenous contrast.  CONTRAST:  73mL MULTIHANCE GADOBENATE DIMEGLUMINE 529 MG/ML IV SOLN  COMPARISON:  MRI of the thoracic and lumbar spine without contrast 01/26/2014.  FINDINGS: MR THORACIC SPINE FINDINGS  Normal signal is present in the thoracic spinal cord. Diffuse metastatic disease is present. There is tumor infiltration at T6 and diffusely on the left at T8, T9, and T10. There is extraosseous tumor extension at the T9 level to the left greater than right. There is significant compression of the central spinal cord at the T9 level is previously documented. Osseous foraminal narrowing bilaterally is worse on the left at T9-10. There is mild foraminal narrowing bilaterally at T8-9 and T10-11 due to tumor encroachment. Tumor extends into the pedicles bilaterally at T8, T9, and T10. There is tumor in the left portion of the vertebral body at T7 and  T11. A 14 mm lesion is present within the right aspect vertebral body at T12.  The postcontrast images demonstrate enhancement of tumor within the bone as described. There is additionally dural enhancement and tumor infiltration diffusely from T8-9 through T10-11. The axial images are somewhat distorted by patient motion particularly through this area. The extraosseous soft tissue at the T9 level demonstrates enhancement as well.  MR LUMBAR SPINE FINDINGS  The postcontrast images of lumbar spine are significantly degraded by patient motion.  Tumor infiltration is present within the posterior vertebral body at L2 without significant extraosseous tumor. Chronic endplate marrow changes are present at L5-S1. No other definite tumor is present. Limited imaging of the abdomen is unremarkable. There is no significant adenopathy.  L1-2: Leftward disc bulging and mild facet hypertrophy are present without significant stenosis.  L2-3: A leftward disc protrusion is present. Moderate facet hypertrophy is evident. This results in mild to  moderate left subarticular and foraminal narrowing. Right subarticular and foraminal narrowing is mild.  L3-4: A broad-based disc protrusion and moderate facet hypertrophy results an moderate subarticular and foraminal narrowing bilaterally, slightly worse on the right.  L4-5: Severe central canal stenosis is due to a broad-based disc protrusion and advanced facet hypertrophy. Moderate left and mild to moderate right foraminal stenosis is present.  L5-S1: A rightward disc protrusion is present. Moderate facet hypertrophy is noted. This results in moderate right subarticular stenosis and mild left subarticular narrowing. Mild to moderate foraminal narrowing is worse on the right.  IMPRESSION: 1. Moderate to severe central canal stenosis at T9 secondary to circumferential tumor involving the T8, T9, and T10 vertebral bodies. 2. Extraosseous tumor on the left at T9. 3. Dural-based tumor extends from T8-9 through T10-11, worse on the left. 4. Foraminal narrowing is present bilaterally from T8-9 through T10-11. It is greatest at T9-10. 5. Tumor within the posterior body of L2 without collapse or extraosseous extension. 6. Spondylosis of the lumbar spine is unrelated to tumor. 7. Mild to moderate left subarticular and foraminal narrowing at L2-3. 8. Moderate subarticular and foraminal stenosis bilaterally at L3-4 is worse on the right. 9. Severe central canal stenosis at L4-5 secondary to a broad-based disc protrusion and advanced facet hypertrophy. 10. Moderate left and mild to moderate right foraminal narrowing at L4-5. 11. Rightward disc protrusion with right subarticular and bilateral foraminal narrowing at L5-S1.   Electronically Signed   By: Lawrence Santiago M.D.   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 Spinal stenosis.  EXAM: MRI THORACIC AND LUMBAR SPINE WITHOUT AND WITH CONTRAST  TECHNIQUE: Multiplanar and  multiecho pulse sequences of the thoracic and lumbar spine were obtained without and with intravenous contrast.  CONTRAST:  17mL MULTIHANCE GADOBENATE DIMEGLUMINE 529 MG/ML IV SOLN  COMPARISON:  MRI of the thoracic and lumbar spine without contrast 01/26/2014.  FINDINGS: MR THORACIC SPINE FINDINGS  Normal signal is present in the thoracic spinal cord. Diffuse metastatic disease is present. There is tumor infiltration at T6 and diffusely on the left at T8, T9, and T10. There is extraosseous tumor extension at the T9 level to the left greater than right. There is significant compression of the central spinal cord at the T9 level is previously documented. Osseous foraminal narrowing bilaterally is worse on the left at T9-10. There is mild foraminal narrowing bilaterally at T8-9 and T10-11 due to tumor encroachment. Tumor extends into  the pedicles bilaterally at T8, T9, and T10. There is tumor in the left portion of the vertebral body at T7 and T11. A 14 mm lesion is present within the right aspect vertebral body at T12.  The postcontrast images demonstrate enhancement of tumor within the bone as described. There is additionally dural enhancement and tumor infiltration diffusely from T8-9 through T10-11. The axial images are somewhat distorted by patient motion particularly through this area. The extraosseous soft tissue at the T9 level demonstrates enhancement as well.  MR LUMBAR SPINE FINDINGS  The postcontrast images of lumbar spine are significantly degraded by patient motion.  Tumor infiltration is present within the posterior vertebral body at L2 without significant extraosseous tumor. Chronic endplate marrow changes are present at L5-S1. No other definite tumor is present. Limited imaging of the abdomen is unremarkable. There is no significant adenopathy.  L1-2: Leftward disc bulging and mild facet hypertrophy are present without significant stenosis.  L2-3: A leftward disc protrusion is present. Moderate facet  hypertrophy is evident. This results in mild to moderate left subarticular and foraminal narrowing. Right subarticular and foraminal narrowing is mild.  L3-4: A broad-based disc protrusion and moderate facet hypertrophy results an moderate subarticular and foraminal narrowing bilaterally, slightly worse on the right.  L4-5: Severe central canal stenosis is due to a broad-based disc protrusion and advanced facet hypertrophy. Moderate left and mild to moderate right foraminal stenosis is present.  L5-S1: A rightward disc protrusion is present. Moderate facet hypertrophy is noted. This results in moderate right subarticular stenosis and mild left subarticular narrowing. Mild to moderate foraminal narrowing is worse on the right.  IMPRESSION: 1. Moderate to severe central canal stenosis at T9 secondary to circumferential tumor involving the T8, T9, and T10 vertebral bodies. 2. Extraosseous tumor on the left at T9. 3. Dural-based tumor extends from T8-9 through T10-11, worse on the left. 4. Foraminal narrowing is present bilaterally from T8-9 through T10-11. It is greatest at T9-10. 5. Tumor within the posterior body of L2 without collapse or extraosseous extension. 6. Spondylosis of the lumbar spine is unrelated to tumor. 7. Mild to moderate left subarticular and foraminal narrowing at L2-3. 8. Moderate subarticular and foraminal stenosis bilaterally at L3-4 is worse on the right. 9. Severe central canal stenosis at L4-5 secondary to a broad-based disc protrusion and advanced facet hypertrophy. 10. Moderate left and mild to moderate right foraminal narrowing at L4-5. 11. Rightward disc protrusion with right subarticular and bilateral foraminal narrowing at L5-S1.   Electronically Signed   By: Lawrence Santiago M.D.   On: 01/28/2014 21:13   Dg Thoracic Spine 1 View  01/29/2014   CLINICAL DATA:  Patient with metastatic prostate carcinoma resulting in spinal stenosis. Tumor removal.  EXAM: DG C-ARM 61-120 MIN; THORACIC  SPINE - 1 VIEW  COMPARISON:  MRI thoracic spine 01/28/2014.  FINDINGS: We are provided with a single intraoperative fluoroscopic spot view of the thoracic spine in the AP projection. Image demonstrates tissues spreaders and probes in place for tumor resection.  IMPRESSION: Thoracic spine tumor resection in progress.   Electronically Signed   By: Inge Rise M.D.   On: 01/29/2014 23:02   Dg Chest Port 1 View  01/29/2014   CLINICAL DATA:  Central line placement.  EXAM: PORTABLE CHEST - 1 VIEW  COMPARISON:  None.  FINDINGS: Mildly enlarged cardiac silhouette. Mild bibasilar atelectasis. Right jugular catheter tip in the superior vena cava. No pneumothorax. Thoracic spine degenerative changes.  IMPRESSION: 1. Right jugular  catheter tip in the superior vena cava without pneumothorax. 2. Mild cardiomegaly and mild bibasilar atelectasis.   Electronically Signed   By: Enrique Sack M.D.   On: 01/29/2014 19:46   Dg C-arm 1-60 Min  01/29/2014   CLINICAL DATA:  Patient with metastatic prostate carcinoma resulting in spinal stenosis. Tumor removal.  EXAM: DG C-ARM 61-120 MIN; THORACIC SPINE - 1 VIEW  COMPARISON:  MRI thoracic spine 01/28/2014.  FINDINGS: We are provided with a single intraoperative fluoroscopic spot view of the thoracic spine in the AP projection. Image demonstrates tissues spreaders and probes in place for tumor resection.  IMPRESSION: Thoracic spine tumor resection in progress.   Electronically Signed   By: Inge Rise M.D.   On: 01/29/2014 23:02    Impression:    The patient is recovering well after a thoracic laminectomy of the T8-T10 levels for spinal cord compression/epidural tumor at that location. He did receive 30 gray in 10 fractions to the T7 to T 11 levels completed in August 2014 in our clinic. I believe that the patient would be a good candidate for postoperative radiosurgery. I discussed this with the patient including the benefit in terms of local control. All of his questions  were answered today.  Plan:  The patient will be scheduled for a CT simulation for treatment planning within the next several weeks. He will also undergo a CT myelogram. All of this will be completed as an outpatient after he further recovers. We will coordinate this treatment with Dr. Sherwood Gambler.   Jodelle Gross, M.D., Ph.D.

## 2014-02-01 LAB — GLUCOSE, CAPILLARY
GLUCOSE-CAPILLARY: 158 mg/dL — AB (ref 70–99)
Glucose-Capillary: 200 mg/dL — ABNORMAL HIGH (ref 70–99)
Glucose-Capillary: 174 mg/dL — ABNORMAL HIGH (ref 70–99)
Glucose-Capillary: 204 mg/dL — ABNORMAL HIGH (ref 70–99)

## 2014-02-01 MED ORDER — DEXAMETHASONE SODIUM PHOSPHATE 4 MG/ML IJ SOLN
4.0000 mg | Freq: Two times a day (BID) | INTRAMUSCULAR | Status: DC
Start: 2014-02-04 — End: 2014-02-04
  Filled 2014-02-01: qty 1

## 2014-02-01 MED ORDER — DEXAMETHASONE SODIUM PHOSPHATE 4 MG/ML IJ SOLN
4.0000 mg | Freq: Four times a day (QID) | INTRAMUSCULAR | Status: AC
  Administered 2014-02-02 – 2014-02-04 (×8): 4 mg via INTRAVENOUS
  Filled 2014-02-01 (×8): qty 1

## 2014-02-01 NOTE — Progress Notes (Signed)
[place] TEAM 1 - Stepdown/ICU TEAM Progress Note  Clarence Pollard IAX:[id number] DOB: 03-23-1940 DOA: 01/26/2014 PCP: [name], MD  Admit HPI / Brief Narrative: 73 y.o. 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 Symptom onset was sudden and progressively worsening. No urinary or bowel symptoms (retention or incontinence). Endorsed worsening of ascending BLE numbness up to hips.  In the ED spinal cord compression was seen on MRI.  Following his admission, high dose steroid tx was initiated.  NS evaluated the pt, and took him to the OR on 11/12 where he underwent a T8-T10 thoracic laminectomy and resection of epidural tumor, with decompression of the spinal canal and spinal cord.  Post operatively he has done very well.  He has experienced improved strength and resolution of pain.  He has remained medically stable postoperatively.  It is anticipated that he will require a rehab stay, either in CIR or a SNF, as he lives alone.  NS continues to follow his rehab care.    HPI/Subjective: No overnight events No SOB, no CP    Assessment/Plan:  Spinal cord compression Secondary to metastatic prostate cancer - patient s/p neurosurgical debulking of tumor on 11/12 - Dr. Sherral Hammers spoke w/ Dr. Alen Blew on 11/12 who recommended outpt f/u to plan possible XRT and change in oral chemo tx  - Decadron taper per NS - wound care -PT/OT eval  Hypokalemia Resolved w/ replacement - Mg normal   Insomnia Continue trazodone 25 mg QHS  Hyperglycemia Due to decadron tx - decadron being tapered per NS - SSI prn for now   Code Status: FULL Family Communication: no family present at time of exam Disposition Plan:  PT/OT - anticipate d/c to CIR or SNF for rehab  Consultants: Dr. Jovita Gamma (neurosurgery)  Procedures: 11/12  -T8-T10 thoracic laminectomy and resection of epidural tumor, with decompression of the spinal canal and spinal cord  Antibiotics: NA  DVT prophylaxis: SCD  Objective: Blood pressure 149/77, pulse 78, temperature 97.7 F (36.5 C), temperature source Oral, resp. rate 16, height 6' (1.829 m), weight 116.3 kg (256 lb 6.3 oz), SpO2 100 %.  Intake/Output Summary (Last 24 hours) at 02/01/14 1214 Last data filed at 02/01/14 0800  Gross per 24 hour  Intake      0 ml  Output   1600 ml  Net  -1600 ml   Exam: General: No acute respiratory distress Lungs: Clear to auscultation bilaterally without wheezes or crackles Cardiovascular: Regular rate and rhythm without murmur gallop or rub  Abdomen: Nontender, nondistended, soft, bowel sounds positive, no rebound, no ascites, no appreciable mass Extremities: No significant cyanosis, clubbing, or edema bilateral lower extremities  Data Reviewed: Basic Metabolic Panel:  Recent Labs Lab 01/26/14 1647 01/26/14 1809 01/28/14 1250 01/29/14 0235 01/30/14 0210 01/31/14 0415  NA 141 143 140 140 141 139  K 3.5* 3.4* 3.6* 4.0 5.4* 4.5  CL 103 107 100 101 102 99  CO2 23  --  25 23 25 26   GLUCOSE 92 94 183* 192* 171* 152*  BUN 11 8 16 21  28* 24*  CREATININE 0.77 0.80  0.80 0.72 0.76 0.89 0.73  CALCIUM 8.8  --  8.5 8.5 7.7* 7.1*  MG  --   --   --  2.1  --   --  Liver Function Tests:  Recent Labs Lab 01/26/14 1647  AST 22  ALT 14  ALKPHOS 183*  BILITOT 0.5  PROT 9.2*  ALBUMIN 3.9   CBC:  Recent Labs Lab 01/26/14 1647 01/26/14 1809 01/29/14 0235 01/30/14 0210  WBC 8.1  --  17.5* 17.0*  NEUTROABS 5.4  --   --  15.8*  HGB 10.1* 10.2* 9.5* 10.2*  HCT 33.2* 30.0* 31.4* 34.0*  MCV 79.6  --  79.5 83.7  PLT 335  --  389 322    Recent Results (from the past 240 hour(s))  MRSA PCR Screening     Status: None   Collection Time: 01/27/14  2:39 AM  Result Value Ref Range Status   MRSA by PCR NEGATIVE NEGATIVE Final    Comment:         The GeneXpert MRSA Assay (FDA approved for NASAL specimens only), is one component of a comprehensive MRSA colonization surveillance program. It is not intended to diagnose MRSA infection nor to guide or monitor treatment for MRSA infections.      Studies:  Recent x-ray studies have been reviewed in detail by the Attending Physician  Scheduled Meds:  Scheduled Meds: . baclofen  10 mg Oral BID  . benztropine  0.5 mg Oral Q12H  . bicalutamide  50 mg Oral Daily  . brimonidine  1 drop Both Eyes QHS  . [START ON 02/02/2014] dexamethasone  4 mg Intravenous 4 times per day  . [START ON 02/04/2014] dexamethasone  4 mg Intravenous Q12H  . dexamethasone  6 mg Intravenous 4 times per day  . ferrous sulfate  325 mg Oral QHS  . insulin aspart  0-9 Units Subcutaneous TID WC  . latanoprost  1 drop Both Eyes QHS  . pantoprazole  40 mg Oral Q1200    Time spent on care of this patient: 25 mins   Eulogio Bear , DO   [facility] Office  [phone] Pager - [phone]  On-Call/Text Page:      [email or web address]      password [email or web address]  If 7PM-7AM, please contact night-coverage [email or web address] Password [email or web address] 02/01/2014, 12:14 PM   LOS: 6 days

## 2014-02-01 NOTE — Progress Notes (Signed)
Patient ID: Clarence Pollard, male   DOB: Aug 10, 1940, 73 y.o.   MRN: [id number] BP 149/77 mmHg  Pulse 78  Temp(Src) 97.7 F (36.5 C) (Oral)  Resp 16  Ht 6' (1.829 m)  Wt 116.3 kg (256 lb 6.3 oz)  BMI 34.77 kg/m2  SpO2 100% Alert and oriented x 4, speech is clear and fluent Moving lower extremities well Wound is clean and dry, there are no signs of infection Ok to start pt, ot

## 2014-02-02 ENCOUNTER — Encounter (HOSPITAL_COMMUNITY): Payer: Self-pay | Admitting: Neurosurgery

## 2014-02-02 DIAGNOSIS — C7951 Secondary malignant neoplasm of bone: Secondary | ICD-10-CM

## 2014-02-02 LAB — GLUCOSE, CAPILLARY
GLUCOSE-CAPILLARY: 155 mg/dL — AB (ref 70–99)
GLUCOSE-CAPILLARY: 299 mg/dL — AB (ref 70–99)
Glucose-Capillary: 130 mg/dL — ABNORMAL HIGH (ref 70–99)
Glucose-Capillary: 171 mg/dL — ABNORMAL HIGH (ref 70–99)

## 2014-02-02 NOTE — Progress Notes (Signed)
Patient was screened by Diahann Guajardo for appropriateness for an Inpatient Acute Rehab consult.  At this time, we are recommending Inpatient Rehab consult.  Please order when you feel appropriate.   Braxtyn Bojarski PT Inpatient Rehab Admissions Coordinator Cell [phone] Office [phone]  

## 2014-02-02 NOTE — Evaluation (Signed)
Physical Therapy Evaluation Patient Details Name: Clarence Pollard MRN: [id number] DOB: 08-12-1940 Today's Date: 02/02/2014   History of Present Illness  Clarence Pollard is a 73 y.o. 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 S/P T8-T10 thoracic laminectomy and resection of epidural tumor, with decompression of the spinal canal and spinal cord on 11/12.     Clinical Impression  Patient presents with functional limitations due to deficits listed in PT problem list (see below). Pt with generalized weakness, ataxic gait and mild balance deficits impacting safe mobility. Pt is not safe to return home alone due to pt being fall risk. Pt highly motivated to return to PLOF. Pt would benefit from acute PT and CIR to improve transfers, gait, balance and mobility so pt can maximize independence and return to PLOF prior to return home.    Follow Up Recommendations CIR    Equipment Recommendations  Other (comment) (defer to CIR)    Recommendations for Other Services       Precautions / Restrictions Precautions Precautions: Fall;Back Restrictions Weight Bearing Restrictions: No      Mobility  Bed Mobility               General bed mobility comments: Received sitting in chair upon PT arrival.   Transfers Overall transfer level: Needs assistance Equipment used: Rolling walker (2 wheeled) Transfers: Sit to/from Stand Sit to Stand: Min guard         General transfer comment: Min guard for steadying self. VC's for hand placement as pt wants to pull up on RW, "aren't you going to block that for me?" referring to RW.  Ambulation/Gait Ambulation/Gait assistance: Min guard Ambulation Distance (Feet): 150 Feet Assistive device: Rolling walker (2 wheeled) Gait Pattern/deviations: Step-through pattern;Decreased stride length;Shuffle;Narrow base of support;Trunk flexed;Ataxic   Gait  velocity interpretation: Below normal speed for age/gender General Gait Details: Pt with mildly ataxic like gait pattern with narrow BoS. VC's for upright posture. Mildly unsteady.  Stairs            Wheelchair Mobility    Modified Rankin (Stroke Patients Only)       Balance Overall balance assessment: Needs assistance Sitting-balance support: No upper extremity supported;Feet supported Sitting balance-Leahy Scale: Good     Standing balance support: During functional activity;Bilateral upper extremity supported Standing balance-Leahy Scale: Poor Standing balance comment: Requires BUE support on RW for safety/balance.                             Pertinent Vitals/Pain Pain Assessment: No/denies pain (Reports pressure in back.)    Home Living Family/patient expects to be discharged to:: Inpatient rehab Living Arrangements: Alone             Home Equipment: Cane - single point;Shower seat;Walker - 2 wheels      Prior Function Level of Independence: Independent with assistive device(s)         Comments: using SPC PTA.     Hand Dominance   Dominant Hand: Right    Extremity/Trunk Assessment   Upper Extremity Assessment: Defer to OT evaluation;Overall WFL for tasks assessed           Lower Extremity Assessment: Generalized weakness         Communication   Communication: No difficulties  Cognition Arousal/Alertness: Awake/alert Behavior During Therapy: WFL for tasks assessed/performed Overall  Cognitive Status: Within Functional Limits for tasks assessed                      General Comments General comments (skin integrity, edema, etc.): Surgical site not visible. Discussed disposition options.     Exercises        Assessment/Plan    PT Assessment Patient needs continued PT services  PT Diagnosis Generalized weakness;Difficulty walking   PT Problem List Decreased strength;Decreased balance;Decreased mobility  PT  Treatment Interventions Balance training;Gait training;Neuromuscular re-education;Patient/family education;Functional mobility training;Therapeutic activities;Therapeutic exercise   PT Goals (Current goals can be found in the Care Plan section) Acute Rehab PT Goals Patient Stated Goal: to go to rehab then home or ALF PT Goal Formulation: With patient Time For Goal Achievement: 02/16/14 Potential to Achieve Goals: Good    Frequency Min 3X/week   Barriers to discharge Decreased caregiver support Pt lives home alone. Discussed moving to [facility].    Co-evaluation               End of Session Equipment Utilized During Treatment: Gait belt Activity Tolerance: Patient tolerated treatment well Patient left: in chair;with call bell/phone within reach;with chair alarm set;with family/visitor present Nurse Communication: Mobility status;Precautions         Time: 1431-1450 PT Time Calculation (min) (ACUTE ONLY): 19 min   Charges:   PT Evaluation $Initial PT Evaluation Tier I: 1 Procedure PT Treatments $Gait Training: 8-22 mins   PT G CodesCandy Sledge A 02/02/2014, 3:03 PM  Candy Sledge, [place], DPT [phone]

## 2014-02-02 NOTE — Progress Notes (Signed)
Progress Note  Clarence Pollard WCB:[id number] DOB: 13-Jun-1940 DOA: 01/26/2014 PCP: [name], MD  Admit HPI / Brief Narrative: 73 y.o. 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 Symptom onset was sudden and progressively worsening. No urinary or bowel symptoms (retention or incontinence). Endorsed worsening of ascending BLE numbness up to hips.  In the ED spinal cord compression was seen on MRI.  Following his admission, high dose steroid tx was initiated.  NS evaluated the pt, and took him to the OR on 11/12 where he underwent a T8-T10 thoracic laminectomy and resection of epidural tumor, with decompression of the spinal canal and spinal cord.  Post operatively he has done very well.  He has experienced improved strength and resolution of pain.  He has remained medically stable postoperatively.  It is anticipated that he will require a rehab stay, either in CIR or a SNF, as he lives alone.  NS continues to follow his rehab care.    HPI/Subjective: Doing well - ready to work with PT    Assessment/Plan:  Spinal cord compression Secondary to metastatic prostate cancer - patient s/p neurosurgical debulking of tumor on 11/12 - Dr. Sherral Hammers spoke w/ Dr. Alen Blew on 11/12 who recommended outpt f/u to plan possible XRT and change in oral chemo tx  - Decadron taper per NS - wound care -PT/OT eval  Hypokalemia Resolved w/ replacement - Mg normal   Insomnia Continue trazodone 25 mg QHS  Hyperglycemia Due to decadron tx - decadron being tapered per NS - SSI prn for now   Code Status: FULL Family Communication: no family present at time of exam Disposition Plan:  PT/OT - anticipate d/c to CIR or SNF for rehab  Consultants: Dr. Jovita Gamma (neurosurgery)  Procedures: 11/12 -T8-T10 thoracic laminectomy and  resection of epidural tumor, with decompression of the spinal canal and spinal cord  Antibiotics: NA  DVT prophylaxis: SCD  Objective: Blood pressure 144/79, pulse 66, temperature 97.7 F (36.5 C), temperature source Axillary, resp. rate 18, height 6' (1.829 m), weight 116.3 kg (256 lb 6.3 oz), SpO2 97 %.  Intake/Output Summary (Last 24 hours) at 02/02/14 0929 Last data filed at 02/02/14 0759  Gross per 24 hour  Intake      0 ml  Output   1750 ml  Net  -1750 ml   Exam: General: A+Ox3, NAD Lungs: Clear to auscultation bilaterally without wheezes or crackles Cardiovascular: Regular rate and rhythm without murmur gallop or rub  Abdomen: Nontender, nondistended, soft, bowel sounds positive, no rebound, no ascites, no appreciable mass Extremities: No significant cyanosis, clubbing, or edema bilateral lower extremities  Data Reviewed: Basic Metabolic Panel:  Recent Labs Lab 01/26/14 1647 01/26/14 1809 01/28/14 1250 01/29/14 0235 01/30/14 0210 01/31/14 0415  NA 141 143 140 140 141 139  K 3.5* 3.4* 3.6* 4.0 5.4* 4.5  CL 103 107 100 101 102 99  CO2 23  --  25 23 25 26   GLUCOSE 92 94 183* 192* 171* 152*  BUN 11 8 16 21  28* 24*  CREATININE 0.77 0.80  0.80 0.72 0.76 0.89 0.73  CALCIUM 8.8  --  8.5 8.5 7.7* 7.1*  MG  --   --   --  2.1  --   --    Liver Function Tests:  Recent Labs Lab  01/26/14 1647  AST 22  ALT 14  ALKPHOS 183*  BILITOT 0.5  PROT 9.2*  ALBUMIN 3.9   CBC:  Recent Labs Lab 01/26/14 1647 01/26/14 1809 01/29/14 0235 01/30/14 0210  WBC 8.1  --  17.5* 17.0*  NEUTROABS 5.4  --   --  15.8*  HGB 10.1* 10.2* 9.5* 10.2*  HCT 33.2* 30.0* 31.4* 34.0*  MCV 79.6  --  79.5 83.7  PLT 335  --  389 322    Recent Results (from the past 240 hour(s))  MRSA PCR Screening     Status: None   Collection Time: 01/27/14  2:39 AM  Result Value Ref Range Status   MRSA by PCR NEGATIVE NEGATIVE Final    Comment:        The GeneXpert MRSA Assay (FDA approved for  NASAL specimens only), is one component of a comprehensive MRSA colonization surveillance program. It is not intended to diagnose MRSA infection nor to guide or monitor treatment for MRSA infections.      Studies:  Recent x-ray studies have been reviewed in detail by the Attending Physician  Scheduled Meds:  Scheduled Meds: . baclofen  10 mg Oral BID  . benztropine  0.5 mg Oral Q12H  . bicalutamide  50 mg Oral Daily  . brimonidine  1 drop Both Eyes QHS  . dexamethasone  4 mg Intravenous 4 times per day  . [START ON 02/04/2014] dexamethasone  4 mg Intravenous Q12H  . ferrous sulfate  325 mg Oral QHS  . insulin aspart  0-9 Units Subcutaneous TID WC  . latanoprost  1 drop Both Eyes QHS  . pantoprazole  40 mg Oral Q1200    Time spent on care of this patient: 25 mins   Eulogio Bear , DO   [facility] Office  [phone] Pager - [phone]  On-Call/Text Page:      [email or web address]      password [email or web address]  If 7PM-7AM, please contact night-coverage [email or web address] Password [email or web address] 02/02/2014, 9:29 AM   LOS: 7 days

## 2014-02-02 NOTE — Consult Note (Signed)
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 Referring Physician: Dr. Eliseo Squires   HPI: Clarence Pollard is a 73 y.o. 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 He was admitted on 01/26/14 with progressive weakness BLE with increased pain from lower thoracic spine to BLE with numbness and difficulty walking. MRI of spine with increasing epidural confluent tumor from T8 - T10 resulting in severe canal stenosis and cord compression at T9, diffuse osseous mets as well as severe lumbar spondylosis with canal stenosis L5-S1 and neural foraminal narrowing L2/3- L5/S1.  He was started on IV steroids and evaluated by Dr. Rita Ohara who recommended thoracic laminectomy for decompression of tumor to help with current symptoms and elected lumbar decompression in the future for neurogenic claudication. Patient underwent T8-T10 thoracic laminectomy with resection of tumor and decompression of spinal cord on 01/29/14.  Dr. Lisbeth Renshaw consulted for input and recommends XRT with CT myelogram in the future. Dr. Alen Blew following for input and recommends systemic therapy as (PSA up to 10) once this episode resolves. OT evaluation done today and CIR recommended for follow up therapy.    Review of Systems  HENT: Negative for hearing loss.   Eyes: Negative for blurred vision and double vision.  Respiratory: Negative for shortness of breath.   Cardiovascular: Negative for chest pain and palpitations.  Gastrointestinal: Negative for heartburn and nausea.  Genitourinary: Positive for frequency.  Musculoskeletal: Positive for myalgias and back pain.  Neurological: Positive for sensory change and focal weakness. Negative for dizziness, tingling and headaches.      Past Medical History  Diagnosis Date  . History of radiation therapy  10/14/12-10/25/12    T7-T11   . Prostate cancer   . Metastatic cancer to spine     Past Surgical History  Procedure Laterality Date  . Appendectomy    . Colon surgery    . Cholecystectomy    . Cataract extraction Bilateral   . Laminectomy N/A 01/29/2014    Procedure: Thoracic laminectomy for tumor resection;  Surgeon: Hosie Spangle, MD;  Location: [facility];  Service: Neurosurgery;  Laterality: N/A;  Thoracic laminectomy for tumor resection    History reviewed. No pertinent family history.    Social History:  Widowed last year.  Independent PTA--plans on transitioning to ALF past discharge. He reports that he has quit smoking in his 20's.  He does not have any smokeless tobacco history on file. He reports that he does not drink alcohol or use illicit drugs.    Allergies: No Known Allergies    Medications Prior to Admission  Medication Sig Dispense Refill  . benztropine (COGENTIN) 0.5 MG tablet Take 0.5 mg by mouth every 12 (twelve) hours.    . bicalutamide (CASODEX) 50 MG tablet Take 50 mg by mouth daily.    . brimonidine (ALPHAGAN P) 0.1 % SOLN Apply 1 drop to eye at bedtime. Both eyes    . Calcium Carbonate-Vitamin D (CALCIUM-VITAMIN D) 500-200 MG-UNIT per tablet Take 1 tablet by mouth 2 (two) times daily with a meal.    . ergocalciferol (VITAMIN D2) 50000 UNITS capsule Take 50,000 Units by mouth once a week. Every Saturday.    . ferrous sulfate 325 (65 FE) MG tablet Take 325 mg by mouth at bedtime.    Marland Kitchen ibuprofen (ADVIL,MOTRIN) 600 MG tablet Take 600 mg by  mouth 2 (two) times daily as needed for moderate pain (pain).    Marland Kitchen OVER THE COUNTER MEDICATION Take by mouth daily. Calcium Citrate 600 mg + Vit D 800 i.u. (2 Tablets daily).    . travoprost, benzalkonium, (TRAVATAN) 0.004 % ophthalmic solution Place 1 drop into both eyes at bedtime.     . traZODone (DESYREL) 25 mg TABS tablet Take 0.5 tablets (25 mg total) by mouth at bedtime as needed for sleep. 30 tablet 1     Home: Home Living Family/patient expects to be discharged to:: Inpatient rehab Living Arrangements: Alone Home Equipment: Kasandra Knudsen - single point, Shower seat, Environmental consultant - 2 wheels  Functional History: Prior Function Level of Independence: Independent with assistive device(s) Comments: using SPC PTA. Functional Status:  Mobility: Bed Mobility Overal bed mobility: Needs Assistance Bed Mobility: Rolling, Sidelying to Sit Rolling: Supervision (with cues for technique) Supine to sit: Min assist (with HOB down and use of rail and increased time) Transfers Overall transfer level: Needs assistance Equipment used: Rolling walker (2 wheeled) Transfers: Sit to/from Stand Sit to Stand: Min assist (with momentum to get up) General transfer comment: required momentum to get up, ataxic-heavy footed gait      ADL: ADL Overall ADL's : Needs assistance/impaired Eating/Feeding: Independent, Sitting Grooming: Set up, Sitting Upper Body Bathing: Set up, Sitting Lower Body Bathing: Moderate assistance (with min A sit<>stand) Upper Body Dressing : Set up, Sitting Lower Body Dressing: Maximal assistance (with min A sit<>stand) Toilet Transfer: Minimal assistance (heavily relying on the walker and the grab bar) Toileting- Clothing Manipulation and Hygiene: Moderate assistance (with min A sit<>stand using RW and grab bar)  Cognition: Cognition Overall Cognitive Status: Within Functional Limits for tasks assessed Orientation Level: Oriented X4 Cognition Arousal/Alertness: Awake/alert Behavior During Therapy: WFL for tasks assessed/performed Overall Cognitive Status: Within Functional Limits for tasks assessed  Blood pressure 103/54, pulse 100, temperature 97.6 F (36.4 C), temperature source Oral, resp. rate 18, height 6' (1.829 m), weight 116.3 kg (256 lb 6.3 oz), SpO2 97 %. Physical Exam  Nursing note and vitals reviewed. Constitutional: He is oriented to person, place, and time. He appears  well-developed and well-nourished.  HENT:  Head: Normocephalic and atraumatic.  Eyes: Conjunctivae are normal. Pupils are equal, round, and reactive to light.  Neck: Normal range of motion. Neck supple.  Cardiovascular: Normal rate and regular rhythm.   Respiratory: Effort normal and breath sounds normal. No respiratory distress. He has no wheezes.  GI: Soft. Bowel sounds are normal.  Neurological: He is alert and oriented to person, place, and time.  Follows commands without difficulty. Decrease in fine motor movements R>LLE. Mild sensory deficits BLE and reports dysesthesias abdominal area. . 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 Otherwise UE's 4/5. LE: 4- hf, 4 ke and 4/5 adf/apf. Cognitively intact  Skin: Skin is warm and dry.  Psychiatric: He has a normal mood and affect. His behavior is normal. Judgment and thought content normal.    Results for orders placed or performed during the hospital encounter of 01/26/14 (from the past 24 hour(s))  Glucose, capillary     Status: Abnormal   Collection Time: 02/01/14  5:18 PM  Result Value Ref Range   Glucose-Capillary 174 (H) 70 - 99 mg/dL  Glucose, capillary     Status: Abnormal   Collection Time: 02/01/14  9:09 PM  Result Value Ref Range   Glucose-Capillary 204 (H) 70 - 99 mg/dL   Comment 1 Documented in Chart    Comment 2 Notify  RN   Glucose, capillary     Status: Abnormal   Collection Time: 02/02/14  6:35 AM  Result Value Ref Range   Glucose-Capillary 155 (H) 70 - 99 mg/dL   Comment 1 Documented in Chart    Comment 2 Notify RN   Glucose, capillary     Status: Abnormal   Collection Time: 02/02/14 11:11 AM  Result Value Ref Range   Glucose-Capillary 299 (H) 70 - 99 mg/dL   No results found.  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 Does the need for close, 24 hr/day medical supervision in concert with the patient's rehab needs make it unreasonable for this patient to be served in a less  intensive setting? Yes 2. Co-Morbidities requiring supervision/potential complications: adhesive capsulitis left shoulder, OA left shoulder 3. Due to bladder management, bowel management, safety, skin/wound care, disease management, medication administration, pain management and patient education, does the patient require 24 hr/day rehab nursing? Yes 4. Does the patient require coordinated care of a physician, rehab nurse, PT (1-2 hrs/day, 5 days/week) and OT (1-2 hrs/day, 5 days/week) to address physical and functional deficits in the context of the above medical diagnosis(es)? Yes Addressing deficits in the following areas: balance, endurance, locomotion, strength, transferring, bowel/bladder control, bathing, dressing, feeding, grooming, toileting and psychosocial support 5. Can the patient actively participate in an intensive therapy program of at least 3 hrs of therapy per day at least 5 days per week? Yes 6. The potential for patient to make measurable gains while on inpatient rehab is excellent 7. Anticipated functional outcomes upon discharge from inpatient rehab are modified independent  with PT, modified independent with OT, n/a with SLP. 8. Estimated rehab length of stay to reach the above functional goals is: 9-14 days 9. Does the patient have adequate social supports and living environment to accommodate these discharge functional goals? Yes 10. Anticipated D/C setting: Home 11. Anticipated post D/C treatments: HH therapy and Outpatient therapy 12. Overall Rehab/Functional Prognosis: excellent  RECOMMENDATIONS: This patient's condition is appropriate for continued rehabilitative care in the following setting: CIR Patient has agreed to participate in recommended program. Yes Note that insurance prior authorization may be required for reimbursement for recommended care.  Comment: Rehab Admissions Coordinator to follow up.  Thanks,  Meredith Staggers, MD,  Mellody Drown \    02/02/2014

## 2014-02-02 NOTE — Progress Notes (Signed)
UR COMPLETED  

## 2014-02-02 NOTE — Progress Notes (Signed)
Filed Vitals:   02/02/14 0122 02/02/14 0638 02/02/14 1100 02/02/14 1330  BP: 129/69 144/79 94/54 103/54  Pulse: 64 66  100  Temp: 97.6 F (36.4 C) 97.7 F (36.5 C)  97.6 F (36.4 C)  TempSrc: Axillary Axillary  Oral  Resp: 18 18  18   Height:      Weight:      SpO2: 95% 97% 69% 97%    BMET  Recent Labs  01/31/14 0415  NA 139  K 4.5  CL 99  CO2 26  GLUCOSE 152*  BUN 24*  CREATININE 0.73  CALCIUM 7.1*    Patient sitting up, eating dinner. Without complaints. Has ambulate once in the halls today. Wound examined, healing nicely, no erythema, swelling, or drainage.  Spoke with patient, his daughter, and his grandsons at the bedside.  Plan: Doing well through postoperative recovery. Will need staples removed at the end of the week.  Hosie Spangle, MD 02/02/2014, 5:47 PM

## 2014-02-02 NOTE — Progress Notes (Signed)
RT entered room to place patient on CPAP and patient is refusing to wear at this time. RT informed patient to notify RT if he decided to be placed on it at a later time.

## 2014-02-02 NOTE — Progress Notes (Signed)
IP PROGRESS NOTE  Subjective:   Principle Diagnosis: 73 year old gentleman with prostate cancer diagnosed in July of 2014. He presented with a Gleason score 5+4 = 9 and a PSA of 34.6. 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   Prior Therapy: He is status post radiation therapy to the thoracic spine between T7 and T8-11. Therapy concluded in August of 2014.  Current therapy: Casodex 50 mg daily started in March of 2015. Lupron given every 3 months under the care of Dr. Janice Norrie. Xgeva given monthly under the care of Dr. Janice Norrie  Patient was found to have a epidural tumor involvement and S/P T8-T10 thoracic laminectomy and resection of epidural tumor, with decompression of the spinal canal and spinal cord on 11/12. He is recovering well at this time.   He is able to ambulate without any falls. No weakness or deficits.   Objective:  Vital signs in last 24 hours: Temp:  [97.4 F (36.3 C)-97.8 F (36.6 C)] 97.7 F (36.5 C) (11/16 0638) Pulse Rate:  [64-85] 66 (11/16 0638) Resp:  [16-20] 18 (11/16 0638) BP: (94-144)/(54-79) 94/54 mmHg (11/16 1100) SpO2:  [69 %-100 %] 69 % (11/16 1100) Weight change:  Last BM Date: 02/01/14  Intake/Output from previous day: 11/15 0701 - 11/16 0700 In: -  Out: 1800 [Urine:1800]  Mouth: mucous membranes moist, pharynx normal without lesions Resp: clear to auscultation bilaterally Cardio: regular rate and rhythm, S1, S2 normal, no murmur, click, rub or gallop GI: soft, non-tender; bowel sounds normal; no masses,  no organomegaly Extremities: extremities normal, atraumatic, no cyanosis or edema Neuro: no deficits.   Lab Results: No results for input(s): WBC, HGB, HCT, PLT in the last 72 hours.  BMET  Recent Labs  01/31/14 0415  NA 139  K 4.5  CL 99  CO2 26  GLUCOSE 152*  BUN 24*  CREATININE 0.73  CALCIUM 7.1*     Medications: I have reviewed the patient's current  medications.  Assessment/Plan:   73 year old gentleman with the following issues:  1. Advanced prostate cancer with metastatic disease to the bone. He is currently on Lupron and Casodex was added in March of 2015. His PSA did drop down to 4.6 but most recently his PSA was up to 10. He will be evaluated in the near future for systmeic therapy once this episode resolves.   2. Epidural tumor involvement and S/PT8-T10 thoracic laminectomy and resection of epidural tumor, with decompression of the spinal canal and spinal cord. He is recovering well. I agree with the current management and likely need radiation in the near future. I discussed these findings with him today and he understands that it would be the best of course of action to get SRS to the spine.   All questions answered today.   I will arrange follow up upon discharge from rehab.   Please call with questions.     LOS: 7 days   [facility] 02/02/2014, 12:38 PM

## 2014-02-02 NOTE — Evaluation (Signed)
Occupational Therapy Evaluation Patient Details Name: LETCHER Pollard MRN: [id number] DOB: [birth year] Today's Date: 02/02/2014    History of Present Illness Clarence Pollard is a 73 y.o. male with h/o prostate cancer with mets to spine treated with radiation last year presents to ED with BLE cramping and weakness for past 6 days   Clinical Impression   This 73 yo male admitted and underwent above presents to acute OT with ataxic-heavy footed gait, weakness, decreased balance, decreased mobility, and increased pain all affecting his ability to care for himself at an Independent-Mod I level of care that he needs to be at to D/C home. We will continue to follow with recommendation to follow up at CIR.    Follow Up Recommendations  CIR    Equipment Recommendations  3 in 1 bedside comode       Precautions / Restrictions Precautions Precautions: Fall;Back (upper thoraic) Restrictions Weight Bearing Restrictions: No      Mobility Bed Mobility Overal bed mobility: Needs Assistance Bed Mobility: Rolling;Sidelying to Sit Rolling: Supervision (with cues for technique)   Supine to sit: Min assist (with HOB down and use of rail and increased time)        Transfers Overall transfer level: Needs assistance Equipment used: Rolling walker (2 wheeled) Transfers: Sit to/from Stand Sit to Stand: Min assist (with momentum to get up)         General transfer comment: required momentum to get up, ataxic-heavy footed gait    Balance Overall balance assessment: Needs assistance Sitting-balance support: No upper extremity supported;Feet supported Sitting balance-Leahy Scale: Good     Standing balance support: Single extremity supported Standing balance-Leahy Scale: Poor                              ADL Overall ADL's : Needs assistance/impaired Eating/Feeding: Independent;Sitting   Grooming: Set up;Sitting   Upper Body Bathing: Set up;Sitting   Lower Body Bathing:  Moderate assistance (with min A sit<>stand)   Upper Body Dressing : Set up;Sitting   Lower Body Dressing: Maximal assistance (with min A sit<>stand)   Toilet Transfer: Minimal assistance (heavily relying on the walker and the grab bar)   Toileting- Clothing Manipulation and Hygiene: Moderate assistance (with min A sit<>stand using RW and grab bar)                         Pertinent Vitals/Pain Pain Assessment: 0-10 Pain Score: 2  Pain Location: mid back Pain Descriptors / Indicators: Aching Pain Intervention(s): Monitored during session;Repositioned     Hand Dominance Right   Extremity/Trunk Assessment Upper Extremity Assessment Upper Extremity Assessment: Overall WFL for tasks assessed   Lower Extremity Assessment Lower Extremity Assessment: Defer to PT evaluation       Communication Communication Communication: No difficulties   Cognition Arousal/Alertness: Awake/alert Behavior During Therapy: WFL for tasks assessed/performed Overall Cognitive Status: Within Functional Limits for tasks assessed                                Home Living Family/patient expects to be discharged to:: Inpatient rehab Living Arrangements: Alone                           Home Equipment: Cane - single point;Shower seat;Walker - 2 wheels  Prior Functioning/Environment Level of Independence: Independent             OT Diagnosis: Generalized weakness;Acute pain;Ataxia   OT Problem List: Decreased strength;Decreased range of motion;Impaired balance (sitting and/or standing);Pain;Decreased knowledge of precautions;Obesity;Decreased knowledge of use of DME or AE   OT Treatment/Interventions: Self-care/ADL training;Patient/family education;Balance training;DME and/or AE instruction;Therapeutic activities    OT Goals(Current goals can be found in the care plan section) Acute Rehab OT Goals Patient Stated Goal: to go to rehab then home or ALF OT  Goal Formulation: With patient Time For Goal Achievement: 02/09/14 Potential to Achieve Goals: Good  OT Frequency: Min 2X/week (3 would be good if possible)   Barriers to D/C: Decreased caregiver support             End of Session Equipment Utilized During Treatment: Gait belt;Rolling walker Nurse Communication: Mobility status  Activity Tolerance: Patient tolerated treatment well Patient left: in chair;with call bell/phone within reach;with chair alarm set   Time: [phone] OT Time Calculation (min): 42 min Charges:  OT General Charges $OT Visit: 1 Procedure OT Evaluation $Initial OT Evaluation Tier I: 1 Procedure OT Treatments $Self Care/Home Management : 23-37 mins  Almon Register [phone] 02/02/2014, 9:51 AM

## 2014-02-03 LAB — BASIC METABOLIC PANEL
Anion gap: 12 (ref 5–15)
BUN: 25 mg/dL — AB (ref 6–23)
CO2: 27 mEq/L (ref 19–32)
CREATININE: 0.7 mg/dL (ref 0.50–1.35)
Calcium: 7 mg/dL — ABNORMAL LOW (ref 8.4–10.5)
Chloride: 98 mEq/L (ref 96–112)
GFR calc Af Amer: >90 mL/min (ref >90)
GLUCOSE: 147 mg/dL — AB (ref 70–99)
POTASSIUM: 4.5 meq/L (ref 3.7–5.3)
Sodium: 137 mEq/L (ref 137–147)

## 2014-02-03 LAB — CBC
HEMATOCRIT: 34.3 % — AB (ref 39.0–52.0)
Hemoglobin: 10.4 g/dL — ABNORMAL LOW (ref 13.0–17.0)
MCH: 24.5 pg — AB (ref 26.0–34.0)
MCHC: 30.3 g/dL (ref 30.0–36.0)
MCV: 80.9 fL (ref 78.0–100.0)
Platelets: 214 10*3/uL (ref 150–400)
RBC: 4.24 MIL/uL (ref 4.22–5.81)
RDW: 18.3 % — AB (ref 11.5–15.5)
WBC: 13.9 10*3/uL — ABNORMAL HIGH (ref 4.0–10.5)

## 2014-02-03 LAB — GLUCOSE, CAPILLARY
GLUCOSE-CAPILLARY: 131 mg/dL — AB (ref 70–99)
GLUCOSE-CAPILLARY: 301 mg/dL — AB (ref 70–99)
GLUCOSE-CAPILLARY: 178 mg/dL — AB (ref 70–99)
Glucose-Capillary: 183 mg/dL — ABNORMAL HIGH (ref 70–99)

## 2014-02-03 NOTE — Progress Notes (Signed)
Patient continues to refuse to wear CPAP. 

## 2014-02-03 NOTE — Plan of Care (Signed)
Problem: Phase I Progression Outcomes Goal: Pain controlled with appropriate interventions Outcome: Completed/Met Date Met:  02/03/14

## 2014-02-03 NOTE — Progress Notes (Signed)
Pt prefers to sit in the chair throughout the day, so we have contracted to stand and ambulate every hour.  Pt has been up 5 times thus far.

## 2014-02-03 NOTE — Progress Notes (Addendum)
Progress Note  Clarence Pollard OMB:[id number] DOB: Dec 16, 1940 DOA: 01/26/2014 PCP: [name], MD  Admit HPI / Brief Narrative: 73 y.o. 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 Symptom onset was sudden and progressively worsening. No urinary or bowel symptoms (retention or incontinence). Endorsed worsening of ascending BLE numbness up to hips.  In the ED spinal cord compression was seen on MRI.  Following his admission, high dose steroid tx was initiated.  NS evaluated the pt, and took him to the OR on 11/12 where he underwent a T8-T10 thoracic laminectomy and resection of epidural tumor, with decompression of the spinal canal and spinal cord.  Post operatively he has done very well.  He has experienced improved strength and resolution of pain.  He has remained medically stable postoperatively.  It is anticipated that he will require a rehab stay, either in CIR or a SNF, as he lives alone.  NS continues to follow his rehab care.    HPI/Subjective: Has radiation planned for December, thinks he will go to rehab tomm (11/18)  Assessment/Plan:  Spinal cord compression Secondary to metastatic prostate cancer - patient s/p neurosurgical debulking of tumor on 11/12 - Dr. Sherral Hammers spoke w/ Dr. Alen Blew on 11/12 who recommended outpt f/u to plan possible XRT and change in oral chemo tx  - Decadron taper per NS - wound care -CIR  Hypokalemia Resolved w/ replacement - Mg normal   Insomnia Continue trazodone 25 mg QHS  Hyperglycemia Due to decadron tx - decadron being tapered per NS - SSI prn for now  -carb mod diet while on steroids   Code Status: FULL Family Communication: no family present at time of exam Disposition Plan:  CIR 11/18?  Consultants: Dr. Jovita Gamma (neurosurgery)  Procedures: 11/12  -T8-T10 thoracic laminectomy and resection of epidural tumor, with decompression of the spinal canal and spinal cord  Antibiotics: NA  DVT prophylaxis: SCD  Objective: Blood pressure 126/67, pulse 65, temperature 97.9 F (36.6 C), temperature source Oral, resp. rate 22, height 6' (1.829 m), weight 116.3 kg (256 lb 6.3 oz), SpO2 97 %.  Intake/Output Summary (Last 24 hours) at 02/03/14 0853 Last data filed at 02/03/14 0737  Gross per 24 hour  Intake      0 ml  Output   1550 ml  Net  -1550 ml   Exam: General: A+Ox3, NAD Lungs: Clear to auscultation bilaterally without wheezes or crackles Cardiovascular: Regular rate and rhythm without murmur gallop or rub  Abdomen: Nontender, nondistended, soft, bowel sounds positive, no rebound, no ascites, no appreciable mass Extremities: No significant cyanosis, clubbing, or edema bilateral lower extremities  Data Reviewed: Basic Metabolic Panel:  Recent Labs Lab 01/28/14 1250 01/29/14 0235 01/30/14 0210 01/31/14 0415 02/03/14 0602  NA 140 140 141 139 137  K 3.6* 4.0 5.4* 4.5 4.5  CL 100 101 102 99 98  CO2 25 23 25 26 27   GLUCOSE 183* 192* 171* 152* 147*  BUN 16 21 28* 24* 25*  CREATININE 0.72 0.76 0.89 0.73 0.70  CALCIUM 8.5 8.5 7.7* 7.1* 7.0*  MG  --  2.1  --   --   --    Liver Function Tests: No results for input(s): AST, ALT, ALKPHOS, BILITOT, PROT, ALBUMIN in the last 168 hours. CBC:  Recent Labs Lab 01/29/14 0235  01/30/14 0210 02/03/14 0602  WBC 17.5* 17.0* 13.9*  NEUTROABS  --  15.8*  --   HGB 9.5* 10.2* 10.4*  HCT 31.4* 34.0* 34.3*  MCV 79.5 83.7 80.9  PLT 389 322 214    Recent Results (from the past 240 hour(s))  MRSA PCR Screening     Status: None   Collection Time: 01/27/14  2:39 AM  Result Value Ref Range Status   MRSA by PCR NEGATIVE NEGATIVE Final    Comment:        The GeneXpert MRSA Assay (FDA approved for NASAL specimens only), is one component of a comprehensive MRSA colonization surveillance  program. It is not intended to diagnose MRSA infection nor to guide or monitor treatment for MRSA infections.      Studies:  Recent x-ray studies have been reviewed in detail by the Attending Physician  Scheduled Meds:  Scheduled Meds: . baclofen  10 mg Oral BID  . benztropine  0.5 mg Oral Q12H  . bicalutamide  50 mg Oral Daily  . brimonidine  1 drop Both Eyes QHS  . dexamethasone  4 mg Intravenous 4 times per day  . [START ON 02/04/2014] dexamethasone  4 mg Intravenous Q12H  . ferrous sulfate  325 mg Oral QHS  . insulin aspart  0-9 Units Subcutaneous TID WC  . latanoprost  1 drop Both Eyes QHS  . pantoprazole  40 mg Oral Q1200    Time spent on care of this patient: 25 mins   Eulogio Bear , DO   [facility] Office  [phone] Pager - [phone]  On-Call/Text Page:      [email or web address]      password [email or web address]  If 7PM-7AM, please contact night-coverage [email or web address] Password [email or web address] 02/03/2014, 8:53 AM   LOS: 8 days

## 2014-02-03 NOTE — Progress Notes (Signed)
Physical Therapy Treatment Patient Details Name: Clarence Pollard MRN: [id number] DOB: Aug 12, 1940 Today's Date: 02/03/2014    History of Present Illness ZAQUAN DUFFNER is a 73 y.o. 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 S/P T8-T10 thoracic laminectomy and resection of epidural tumor, with decompression of the spinal canal and spinal cord on 11/12.     PT Comments    Patient progressing well with mobility. Pt fatigues quickly with increased distances resulting in partial knee buckling and Min A for balance/support. Continues to exhibit mild balance deficits. Reviewed log roll technique and back precautions. Encouraged upright posture to elicit facilitation of abdominals and trunk control. Will continue to follow and progress as tolerated.   Follow Up Recommendations  CIR     Equipment Recommendations  Other (comment)    Recommendations for Other Services       Precautions / Restrictions Precautions Precautions: Fall;Back Precaution Booklet Issued: No Restrictions Weight Bearing Restrictions: No    Mobility  Bed Mobility Overal bed mobility: Needs Assistance Bed Mobility: Rolling;Sidelying to Sit;Sit to Sidelying Rolling: Supervision Sidelying to sit: Min guard;HOB elevated     Sit to sidelying: Min guard General bed mobility comments: VC's for log roll technique. Min guard for safety. No physical assist required. Mild difficulty bringing BLEs into bed. Pt elevated HOB bed prior to sitting up for assist. use of rails.  Transfers Overall transfer level: Needs assistance Equipment used: Rolling walker (2 wheeled) Transfers: Sit to/from Stand Sit to Stand: Min guard         General transfer comment: VC's for hand placement as pt with tendency to pull onto walker to stand. Min guard for steadying upon standing. Stood from EOB and from chair x1.    Ambulation/Gait Ambulation/Gait assistance: Min assist Ambulation Distance (Feet): 170 Feet Assistive device: Rolling walker (2 wheeled) Gait Pattern/deviations: Step-through pattern;Decreased stride length;Ataxic;Narrow base of support   Gait velocity interpretation: Below normal speed for age/gender General Gait Details: Ataxic like gait pattern mildly improved from prior session. Partial knee buckling present towards end of ambulatiom bout requiring min A for balance due to almost LOB. Fatigues quickly.   Stairs            Wheelchair Mobility    Modified Rankin (Stroke Patients Only)       Balance Overall balance assessment: Needs assistance Sitting-balance support: Feet supported;No upper extremity supported Sitting balance-Leahy Scale: Good Sitting balance - Comments: Worked on static sitting balance and upright posture without UE support as pt's position of comfort is leaning forward on walker and/or tray table. Sat in chair unsupported for ~5 minutes.    Standing balance support: During functional activity;Bilateral upper extremity supported   Standing balance comment: Requires BUE support on RW for balance/safety. Pt washing hands at sink upon PT arrival and almost LOB standing at sink without UE support. Min A to maintain balance.                     Cognition Arousal/Alertness: Awake/alert Behavior During Therapy: WFL for tasks assessed/performed Overall Cognitive Status: Within Functional Limits for tasks assessed                      Exercises      General Comments General comments (skin integrity, edema, etc.): Discussed importance of back precautions and log roll technique for pain control and safety.  Encouraged upright posture during gait and sitting to elicit postural muscles and trunk control.      Pertinent Vitals/Pain Pain Assessment: No/denies pain    Home Living                      Prior Function            PT  Goals (current goals can now be found in the care plan section) Progress towards PT goals: Progressing toward goals    Frequency  Min 3X/week    PT Plan Current plan remains appropriate    Co-evaluation             End of Session Equipment Utilized During Treatment: Gait belt Activity Tolerance: Patient limited by fatigue;Patient tolerated treatment well Patient left: in chair;with call bell/phone within reach;with chair alarm set     Time: 2505-3976 PT Time Calculation (min) (ACUTE ONLY): 25 min  Charges:  $Gait Training: 8-22 mins $Therapeutic Activity: 8-22 mins                    G CodesCandy Sledge A 02-15-2014, 10:15 AM Candy Sledge, [place], DPT [phone]

## 2014-02-03 NOTE — Progress Notes (Signed)
Inpatient Diabetes Program Recommendations  AACE/ADA: New Consensus Statement on Inpatient Glycemic Control (2013)  Target Ranges:  Prepandial:   less than 140 mg/dL      Peak postprandial:   less than 180 mg/dL (1-2 hours)      Critically ill patients:  140 - 180 mg/dL   Results for MARKEESE, BOYAJIAN (MRN [id number]) as of 02/03/2014 11:00  Ref. Range 02/02/2014 06:35 02/02/2014 11:11 02/02/2014 17:07 02/02/2014 22:21 02/03/2014 06:45  Glucose-Capillary Latest Range: 70-99 mg/dL 155 (H) 299 (H) 130 (H) 171 (H) 131 (H)    Reason for Assessment: elevated CBG  Diabetes history: none Outpatient Diabetes medications: none Current orders for Inpatient glycemic control: Novolog 0-9 units tid with meals  Please consider changing diet to carb modified.  Gentry Fitz, RN, BA, MHA, CDE Diabetes Coordinator Inpatient Diabetes Program  [phone] (Team Pager) [phone] [facility]) 02/03/2014 11:01 AM

## 2014-02-03 NOTE — Progress Notes (Signed)
Pt ambulated appx 300 ft

## 2014-02-03 NOTE — Progress Notes (Signed)
I met with pt at bedside. We discussed an inpt rehab admission pending insuruance approval and bed availability. Pt wants inpt rehab and then d/c to  ALF which his daughter is contacting facilities now. I will begin precert with [employer]. [phone] 

## 2014-02-03 NOTE — Progress Notes (Addendum)
Pt ambulated in the hallway appx 150 ft with RN, using front wheel walker.  Pt tolerated ambulation well, steady gait, although had to continually remind him to stand up straight and not bend forward.  Pt up to chair for breakfast.

## 2014-02-03 NOTE — Plan of Care (Signed)
Problem: Phase I Progression Outcomes Goal: OOB as tolerated unless otherwise ordered Outcome: Completed/Met Date Met:  02/03/14     

## 2014-02-04 ENCOUNTER — Inpatient Hospital Stay (HOSPITAL_COMMUNITY)
Admission: RE | Admit: 2014-02-04 | Discharge: 2014-02-14 | DRG: 052 | Disposition: A | Discharge status: Home Health | Payer: Medicare Other | Source: Intra-hospital | Attending: Physical Medicine & Rehabilitation | Admitting: Physical Medicine & Rehabilitation

## 2014-02-04 DIAGNOSIS — G9529 Other cord compression: Secondary | ICD-10-CM | POA: Diagnosis present

## 2014-02-04 DIAGNOSIS — R739 Hyperglycemia, unspecified: Secondary | ICD-10-CM | POA: Diagnosis present

## 2014-02-04 DIAGNOSIS — B964 Proteus (mirabilis) (morganii) as the cause of diseases classified elsewhere: Secondary | ICD-10-CM | POA: Diagnosis present

## 2014-02-04 DIAGNOSIS — C7931 Secondary malignant neoplasm of brain: Secondary | ICD-10-CM | POA: Diagnosis not present

## 2014-02-04 DIAGNOSIS — N289 Disorder of kidney and ureter, unspecified: Secondary | ICD-10-CM | POA: Diagnosis present

## 2014-02-04 DIAGNOSIS — T380X5A Adverse effect of glucocorticoids and synthetic analogues, initial encounter: Secondary | ICD-10-CM | POA: Diagnosis present

## 2014-02-04 DIAGNOSIS — G9520 Unspecified cord compression: Secondary | ICD-10-CM | POA: Diagnosis present

## 2014-02-04 DIAGNOSIS — C7951 Secondary malignant neoplasm of bone: Secondary | ICD-10-CM

## 2014-02-04 DIAGNOSIS — D500 Iron deficiency anemia secondary to blood loss (chronic): Secondary | ICD-10-CM | POA: Diagnosis present

## 2014-02-04 DIAGNOSIS — C801 Malignant (primary) neoplasm, unspecified: Secondary | ICD-10-CM

## 2014-02-04 DIAGNOSIS — D72829 Elevated white blood cell count, unspecified: Secondary | ICD-10-CM | POA: Diagnosis present

## 2014-02-04 DIAGNOSIS — C61 Malignant neoplasm of prostate: Secondary | ICD-10-CM

## 2014-02-04 DIAGNOSIS — G8220 Paraplegia, unspecified: Secondary | ICD-10-CM | POA: Diagnosis present

## 2014-02-04 DIAGNOSIS — N390 Urinary tract infection, site not specified: Secondary | ICD-10-CM | POA: Diagnosis present

## 2014-02-04 DIAGNOSIS — Z510 Encounter for antineoplastic radiation therapy: Secondary | ICD-10-CM | POA: Diagnosis present

## 2014-02-04 DIAGNOSIS — E875 Hyperkalemia: Secondary | ICD-10-CM | POA: Diagnosis present

## 2014-02-04 DIAGNOSIS — R7989 Other specified abnormal findings of blood chemistry: Secondary | ICD-10-CM | POA: Diagnosis present

## 2014-02-04 DIAGNOSIS — N19 Unspecified kidney failure: Secondary | ICD-10-CM | POA: Diagnosis present

## 2014-02-04 LAB — URINALYSIS, ROUTINE W REFLEX MICROSCOPIC
Bilirubin Urine: NEGATIVE
Glucose, UA: NEGATIVE mg/dL
Hgb urine dipstick: NEGATIVE
Ketones, ur: NEGATIVE mg/dL
LEUKOCYTES UA: NEGATIVE
Nitrite: NEGATIVE
PROTEIN: NEGATIVE mg/dL
Specific Gravity, Urine: 1.021 (ref 1.005–1.030)
UROBILINOGEN UA: 0.2 mg/dL (ref 0.0–1.0)
pH: 6 (ref 5.0–8.0)

## 2014-02-04 LAB — GLUCOSE, CAPILLARY
GLUCOSE-CAPILLARY: 141 mg/dL — AB (ref 70–99)
GLUCOSE-CAPILLARY: 142 mg/dL — AB (ref 70–99)
GLUCOSE-CAPILLARY: 203 mg/dL — AB (ref 70–99)
Glucose-Capillary: 154 mg/dL — ABNORMAL HIGH (ref 70–99)

## 2014-02-04 MED ORDER — BISACODYL 10 MG RE SUPP: 10.0000 mg | Freq: Every day | RECTAL | Status: DC | PRN

## 2014-02-04 MED ORDER — PROCHLORPERAZINE 25 MG RE SUPP: 12.5000 mg | Freq: Four times a day (QID) | RECTAL | Status: DC | PRN

## 2014-02-04 MED ORDER — SENNOSIDES-DOCUSATE SODIUM 8.6-50 MG PO TABS
1.0000 | ORAL_TABLET | Freq: Every evening | ORAL | Status: DC | PRN
  Administered 2014-02-06: 1 via ORAL
  Filled 2014-02-04: qty 1

## 2014-02-04 MED ORDER — BACLOFEN 10 MG PO TABS
10.0000 mg | ORAL_TABLET | Freq: Two times a day (BID) | ORAL | Status: DC
  Administered 2014-02-04 – 2014-02-14 (×19): 10 mg via ORAL
  Filled 2014-02-04 (×25): qty 1

## 2014-02-04 MED ORDER — PROCHLORPERAZINE EDISYLATE 5 MG/ML IJ SOLN: 5.0000 mg | Freq: Four times a day (QID) | INTRAMUSCULAR | Status: DC | PRN

## 2014-02-04 MED ORDER — FLEET ENEMA 7-19 GM/118ML RE ENEM: 1.0000 | ENEMA | Freq: Once | RECTAL | Status: AC | PRN

## 2014-02-04 MED ORDER — PROCHLORPERAZINE MALEATE 5 MG PO TABS
5.0000 mg | ORAL_TABLET | Freq: Four times a day (QID) | ORAL | Status: DC | PRN
  Filled 2014-02-04: qty 2

## 2014-02-04 MED ORDER — DEXAMETHASONE 4 MG PO TABS
4.0000 mg | ORAL_TABLET | Freq: Two times a day (BID) | ORAL | Status: DC
  Administered 2014-02-04: 4 mg via ORAL
  Filled 2014-02-04: qty 1

## 2014-02-04 MED ORDER — BICALUTAMIDE 50 MG PO TABS
50.0000 mg | ORAL_TABLET | Freq: Every day | ORAL | Status: DC
  Administered 2014-02-05 – 2014-02-14 (×9): 50 mg via ORAL
  Filled 2014-02-04 (×13): qty 1

## 2014-02-04 MED ORDER — DEXAMETHASONE 4 MG PO TABS
4.0000 mg | ORAL_TABLET | Freq: Two times a day (BID) | ORAL | Status: DC
  Administered 2014-02-04 – 2014-02-14 (×19): 4 mg via ORAL
  Filled 2014-02-04 (×25): qty 1

## 2014-02-04 MED ORDER — LATANOPROST 0.005 % OP SOLN
1.0000 [drp] | Freq: Every day | OPHTHALMIC | Status: DC
  Administered 2014-02-04 – 2014-02-13 (×10): 1 [drp] via OPHTHALMIC
  Filled 2014-02-04 (×3): qty 2.5

## 2014-02-04 MED ORDER — BENZTROPINE MESYLATE 0.5 MG PO TABS
0.5000 mg | ORAL_TABLET | Freq: Two times a day (BID) | ORAL | Status: DC
  Administered 2014-02-04 – 2014-02-14 (×19): 0.5 mg via ORAL
  Filled 2014-02-04 (×25): qty 1

## 2014-02-04 MED ORDER — ALUM & MAG HYDROXIDE-SIMETH 200-200-20 MG/5ML PO SUSP: 30.0000 mL | ORAL | Status: DC | PRN

## 2014-02-04 MED ORDER — PANTOPRAZOLE SODIUM 40 MG PO TBEC
40.0000 mg | DELAYED_RELEASE_TABLET | Freq: Every day | ORAL | Status: DC
  Administered 2014-02-05 – 2014-02-14 (×9): 40 mg via ORAL
  Filled 2014-02-04 (×9): qty 1

## 2014-02-04 MED ORDER — GUAIFENESIN-DM 100-10 MG/5ML PO SYRP: 5.0000 mL | ORAL_SOLUTION | Freq: Four times a day (QID) | ORAL | Status: DC | PRN

## 2014-02-04 MED ORDER — BRIMONIDINE TARTRATE 0.2 % OP SOLN
1.0000 [drp] | Freq: Every day | OPHTHALMIC | Status: DC
  Administered 2014-02-04 – 2014-02-13 (×10): 1 [drp] via OPHTHALMIC
  Filled 2014-02-04: qty 5

## 2014-02-04 MED ORDER — INSULIN ASPART 100 UNIT/ML ~~LOC~~ SOLN
0.0000 [IU] | Freq: Three times a day (TID) | SUBCUTANEOUS | Status: DC
  Administered 2014-02-04: 3 [IU] via SUBCUTANEOUS
  Administered 2014-02-05: 1 [IU] via SUBCUTANEOUS
  Administered 2014-02-06: 2 [IU] via SUBCUTANEOUS
  Administered 2014-02-06 – 2014-02-07 (×2): 1 [IU] via SUBCUTANEOUS
  Administered 2014-02-07: 2 [IU] via SUBCUTANEOUS
  Administered 2014-02-07: 1 [IU] via SUBCUTANEOUS
  Administered 2014-02-08: 2 [IU] via SUBCUTANEOUS
  Administered 2014-02-08: 1 [IU] via SUBCUTANEOUS
  Administered 2014-02-08: 2 [IU] via SUBCUTANEOUS
  Administered 2014-02-09 (×2): 1 [IU] via SUBCUTANEOUS
  Administered 2014-02-09: 2 [IU] via SUBCUTANEOUS
  Administered 2014-02-10: 1 [IU] via SUBCUTANEOUS
  Administered 2014-02-11: 2 [IU] via SUBCUTANEOUS
  Administered 2014-02-11: 1 [IU] via SUBCUTANEOUS
  Administered 2014-02-11: 2 [IU] via SUBCUTANEOUS
  Administered 2014-02-12 (×2): 1 [IU] via SUBCUTANEOUS
  Administered 2014-02-12: 3 [IU] via SUBCUTANEOUS
  Administered 2014-02-13: 1 [IU] via SUBCUTANEOUS
  Administered 2014-02-13: 3 [IU] via SUBCUTANEOUS
  Administered 2014-02-13 – 2014-02-14 (×3): 1 [IU] via SUBCUTANEOUS

## 2014-02-04 MED ORDER — FERROUS SULFATE 325 (65 FE) MG PO TABS
325.0000 mg | ORAL_TABLET | Freq: Two times a day (BID) | ORAL | Status: DC
  Administered 2014-02-04 – 2014-02-14 (×19): 325 mg via ORAL
  Filled 2014-02-04 (×21): qty 1

## 2014-02-04 MED ORDER — TRAZODONE HCL 50 MG PO TABS: 25.0000 mg | ORAL_TABLET | Freq: Every evening | ORAL | Status: DC | PRN

## 2014-02-04 MED ORDER — ACETAMINOPHEN 325 MG PO TABS: 325.0000 mg | ORAL_TABLET | ORAL | Status: DC | PRN

## 2014-02-04 NOTE — H&P (Signed)
Expand All Collapse All      Physical Medicine and Rehabilitation Admission H&P   Chief Complaint  Patient presents with  . 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 He was admitted on 01/26/14 with progressive weakness BLE with increased pain from lower thoracic spine to BLE with numbness and difficulty walking. MRI of spine with increasing epidural confluent tumor from T8 - T10 resulting in severe canal stenosis and cord compression at T9, diffuse osseous mets as well as severe lumbar spondylosis with canal stenosis L5-S1 and neural foraminal narrowing L2/3- L5/S1. He was started on IV steroids and evaluated by Dr. Rita Ohara who recommended thoracic laminectomy for decompression of tumor to help with current symptoms and elected lumbar decompression in the future for neurogenic claudication. Patient underwent T8-T10 thoracic laminectomy with resection of tumor and decompression of spinal cord on 01/29/14. Dr. Lisbeth Renshaw consulted for input and recommends XRT with CT myelogram in the future. Dr. Alen Blew following for input and recommends systemic therapy once this episode resolves. To start decadron taper per Dr. Rita Ohara. Therapy ongoing and CIR recommended by MD/rehab team and subsequently patient admitted today.   Review of Systems  HENT: Negative for hearing loss.  Eyes: Negative for blurred vision and double vision.  Respiratory: Negative for cough and shortness of breath.  Cardiovascular: Negative for chest pain and palpitations.  Gastrointestinal: Negative for heartburn, nausea and constipation.  Genitourinary: Positive for frequency. Negative for dysuria and urgency.  Musculoskeletal: Negative for myalgias and back pain.  Neurological: Negative for dizziness, tingling, sensory  change and headaches.  Psychiatric/Behavioral: The patient is not nervous/anxious and does not have insomnia.   Past Medical History  Diagnosis Date  . History of radiation therapy 10/14/12-10/25/12    T7-T11   . Prostate cancer   . Metastatic cancer to spine    Past Surgical History  Procedure Laterality Date  . Appendectomy    . Colon surgery    . Cholecystectomy    . Cataract extraction Bilateral   . Laminectomy N/A 01/29/2014    Procedure: Thoracic laminectomy for tumor resection; Surgeon: Hosie Spangle, MD; Location: [facility]; Service: Neurosurgery; Laterality: N/A; Thoracic laminectomy for tumor resection   History reviewed. No pertinent family history.   Social History: Widowed last year. 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  Independent PTA--plans on transitioning to ALF past discharge. He reports that he has quit smoking in his 20's. He does not have any smokeless tobacco history on file. He reports that he does not drink alcohol or use illicit drugs.     Allergies: No Known Allergies   Medications Prior to Admission  Medication Sig Dispense Refill  . benztropine (COGENTIN) 0.5 MG tablet Take 0.5 mg by mouth every 12 (twelve) hours.    . bicalutamide (CASODEX) 50 MG tablet Take 50 mg by mouth daily.    . brimonidine (ALPHAGAN P) 0.1 % SOLN Apply 1 drop to eye at bedtime. Both eyes    . Calcium Carbonate-Vitamin D (CALCIUM-VITAMIN D) 500-200 MG-UNIT per tablet Take 1 tablet by mouth 2 (two) times daily with a meal.    . ergocalciferol (VITAMIN D2) 50000 UNITS capsule Take 50,000 Units by mouth once a week. Every Saturday.    . ferrous sulfate 325 (65 FE) MG tablet Take 325  mg by mouth at bedtime.    Marland Kitchen ibuprofen (ADVIL,MOTRIN) 600 MG tablet Take 600 mg by mouth 2 (two) times daily as needed for moderate pain (pain).    Marland Kitchen OVER THE COUNTER MEDICATION Take by mouth daily.  Calcium Citrate 600 mg + Vit D 800 i.u. (2 Tablets daily).    . travoprost, benzalkonium, (TRAVATAN) 0.004 % ophthalmic solution Place 1 drop into both eyes at bedtime.     . traZODone (DESYREL) 25 mg TABS tablet Take 0.5 tablets (25 mg total) by mouth at bedtime as needed for sleep. 30 tablet 1    Home: Home Living Family/patient expects to be discharged to:: Inpatient rehab Living Arrangements: Alone Home Equipment: Kasandra Knudsen - single point, Shower seat, Environmental consultant - 2 wheels  Functional History: Prior Function Level of Independence: Independent with assistive device(s) Comments: using SPC PTA.  Functional Status:  Mobility: Bed Mobility Overal bed mobility: Needs Assistance Bed Mobility: Rolling, Sidelying to Sit, Sit to Sidelying Rolling: Supervision Sidelying to sit: Min guard, HOB elevated Supine to sit: Min assist (with HOB down and use of rail and increased time) Sit to sidelying: Min guard General bed mobility comments: VC's for log roll technique. Min guard for safety. No physical assist required. Mild difficulty bringing BLEs into bed. Pt elevated HOB bed prior to sitting up for assist. use of rails. Transfers Overall transfer level: Needs assistance Equipment used: Rolling walker (2 wheeled) Transfers: Sit to/from Stand Sit to Stand: Min guard General transfer comment: VC's for hand placement as pt with tendency to pull onto walker to stand. Min guard for steadying upon standing. Stood from EOB and from chair x1.  Ambulation/Gait Ambulation/Gait assistance: Min assist Ambulation Distance (Feet): 170 Feet Assistive device: Rolling walker (2 wheeled) Gait Pattern/deviations: Step-through pattern, Decreased stride length, Ataxic, Narrow base of support Gait velocity interpretation: Below normal speed for age/gender General Gait Details: Ataxic like gait pattern mildly improved from prior session. Partial knee buckling present towards end of ambulatiom bout  requiring min A for balance due to almost LOB. Fatigues quickly.    ADL: ADL Overall ADL's : Needs assistance/impaired Eating/Feeding: Independent, Sitting Grooming: Set up, Sitting Upper Body Bathing: Set up, Sitting Lower Body Bathing: Moderate assistance (with min A sit<>stand) Upper Body Dressing : Set up, Sitting Lower Body Dressing: Maximal assistance (with min A sit<>stand) Toilet Transfer: Minimal assistance (heavily relying on the walker and the grab bar) Toileting- Clothing Manipulation and Hygiene: Moderate assistance (with min A sit<>stand using RW and grab bar)  Cognition: Cognition Overall Cognitive Status: Within Functional Limits for tasks assessed Orientation Level: Oriented X4 Cognition Arousal/Alertness: Awake/alert Behavior During Therapy: WFL for tasks assessed/performed Overall Cognitive Status: Within Functional Limits for tasks assessed  Physical Exam: Blood pressure 128/78, pulse 69, temperature 97.6 F (36.4 C), temperature source Oral, resp. rate 16, height 6' (1.829 m), weight 116.3 kg (256 lb 6.3 oz), SpO2 100 %. Physical Exam Nursing note and vitals reviewed. Constitutional: He is oriented to person, place, and time. He appears well-developed and well-nourished.  HENT: dentition fair,  Head: Normocephalic and atraumatic.  Eyes: Conjunctivae are normal. Pupils are equal, round, and reactive to light.  Neck: Normal range of motion. Neck supple.  Cardiovascular: Normal rate and regular rhythm.  Respiratory: Effort normal and breath sounds normal. No respiratory distress. He has no wheezes.  GI: Soft. Bowel sounds are normal.  Neurological: He is alert and oriented to person, place, and time.  Follows commands without difficulty. Decrease in fine motor movements  R>LLE. Mild sensory deficits BLE and ? dysesthesias abdominal area. Decreased proprioception in legs.  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 Otherwise UE's 4/5. LE: 4- hf, 4 ke and 4/5  adf/apf. Cognitively intact  Skin: Skin is warm and dry. Back incision intact without erythema or drainage. Mild bogginess on proximal aspect of incision.  Psychiatric: He has a normal mood and affect. His behavior is normal. Judgment and thought content normal.     Lab Results Last 48 Hours    Results for orders placed or performed during the hospital encounter of 01/26/14 (from the past 48 hour(s))  Glucose, capillary Status: Abnormal   Collection Time: 02/02/14 11:11 AM  Result Value Ref Range   Glucose-Capillary 299 (H) 70 - 99 mg/dL  Glucose, capillary Status: Abnormal   Collection Time: 02/02/14 5:07 PM  Result Value Ref Range   Glucose-Capillary 130 (H) 70 - 99 mg/dL  Glucose, capillary Status: Abnormal   Collection Time: 02/02/14 10:21 PM  Result Value Ref Range   Glucose-Capillary 171 (H) 70 - 99 mg/dL   Comment 1 Documented in Chart    Comment 2 Notify RN   CBC Status: Abnormal   Collection Time: 02/03/14 6:02 AM  Result Value Ref Range   WBC 13.9 (H) 4.0 - 10.5 K/uL   RBC 4.24 4.22 - 5.81 MIL/uL   Hemoglobin 10.4 (L) 13.0 - 17.0 g/dL   HCT 34.3 (L) 39.0 - 52.0 %   MCV 80.9 78.0 - 100.0 fL   MCH 24.5 (L) 26.0 - 34.0 pg   MCHC 30.3 30.0 - 36.0 g/dL   RDW 18.3 (H) 11.5 - 15.5 %   Platelets 214 150 - 400 K/uL  Basic metabolic panel Status: Abnormal   Collection Time: 02/03/14 6:02 AM  Result Value Ref Range   Sodium 137 137 - 147 mEq/L   Potassium 4.5 3.7 - 5.3 mEq/L   Chloride 98 96 - 112 mEq/L   CO2 27 19 - 32 mEq/L   Glucose, Bld 147 (H) 70 - 99 mg/dL   BUN 25 (H) 6 - 23 mg/dL   Creatinine, Ser 0.70 0.50 - 1.35 mg/dL   Calcium 7.0 (L) 8.4 - 10.5 mg/dL   GFR calc non Af Amer >90 >90 mL/min   GFR calc Af Amer >90 >90 mL/min    Comment: (NOTE) The eGFR has been calculated using the CKD EPI equation. This  calculation has not been validated in all clinical situations. eGFR's persistently <90 mL/min signify possible Chronic Kidney Disease.    Anion gap 12 5 - 15  Glucose, capillary Status: Abnormal   Collection Time: 02/03/14 6:45 AM  Result Value Ref Range   Glucose-Capillary 131 (H) 70 - 99 mg/dL   Comment 1 Notify RN    Comment 2 Documented in Chart   Glucose, capillary Status: Abnormal   Collection Time: 02/03/14 11:15 AM  Result Value Ref Range   Glucose-Capillary 301 (H) 70 - 99 mg/dL   Comment 1 Notify RN   Glucose, capillary Status: Abnormal   Collection Time: 02/03/14 4:20 PM  Result Value Ref Range   Glucose-Capillary 183 (H) 70 - 99 mg/dL   Comment 1 Notify RN   Glucose, capillary Status: Abnormal   Collection Time: 02/03/14 10:10 PM  Result Value Ref Range   Glucose-Capillary 178 (H) 70 - 99 mg/dL   Comment 1 Documented in Chart    Comment 2 Notify RN   Glucose, capillary Status: Abnormal   Collection Time: 02/04/14 6:57 AM  Result  Value Ref Range   Glucose-Capillary 141 (H) 70 - 99 mg/dL      Imaging Results (Last 48 hours)    No results found.       Medical Problem List and Plan: 1. 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 DVT Prophylaxis/Anticoagulation: SCD's, gait/mobility 3. Pain Management: N/A--denies any discomfort.  4. Mood: LCSW to follow for evaluation and support.  5. Neuropsych: This patient is capable of making decisions on his own behalf. 6. Skin/Wound Care: Routine pressure relief measures. Monitor wound for Healing.  7. Fluids/Electrolytes/Nutrition: Will monitor I/O. Offer nutritional supplements prn poor intake. Acute renal insufficiency resolving. Push po fluids and recheck lytes in am.  8. Reactive leucocytosis: Likely due to surgery as  well as steroids. To start decadron taper per NS.  9. Acute on chronic anemia: Continue iron supplement.  10. Steroid induced hyperglycemia: Will continue CBG checks for now and use SSI for elevated BS.    Post Admission Physician Evaluation: 1. Functional deficits secondary to metastatic prostate cancer to the thoracic spine 2. Patient is admitted to receive collaborative, interdisciplinary care between the physiatrist, rehab nursing staff, and therapy team. 3. Patient's level of medical complexity and substantial therapy needs in context of that medical necessity cannot be provided at a lesser intensity of care such as a SNF. 4. Patient has experienced substantial functional loss from his/her baseline which was documented above under the "Functional History" and "Functional Status" headings. Judging by the patient's diagnosis, physical exam, and functional history, the patient has potential for functional progress which will result in measurable gains while on inpatient rehab. These gains will be of substantial and practical use upon discharge in facilitating mobility and self-care at the household level. 5. Physiatrist will provide 24 hour management of medical needs as well as oversight of the therapy plan/treatment and provide guidance as appropriate regarding the interaction of the two. 6. 24 hour rehab nursing will assist with bladder management, bowel management, safety, skin/wound care, disease management, medication administration, pain management and patient education and help integrate therapy concepts, techniques,education, etc. 7. PT will assess and treat for/with: Lower extremity strength, range of motion, stamina, balance, functional mobility, safety, adaptive techniques and equipment, pain mgt, back precautions, NMR, community reintegration, ego support. Goals are: mod I. 8. OT will assess and treat for/with: ADL's, functional mobility, safety, upper extremity strength, adaptive  techniques and equipment, NMR, back precautions, pain mgt, leisure awareness. Goals are: mod I. Therapy may not yet proceed with showering this patient. 9. SLP will assess and treat for/with: n/a. Goals are: n/a. 10. Case Management and Social Worker will assess and treat for psychological issues and discharge planning. 11. Team conference will be held weekly to assess progress toward goals and to determine barriers to discharge. 12. Patient will receive at least 3 hours of therapy per day at least 5 days per week. 13. ELOS: 7-10 days  14. Prognosis: excellent     Meredith Staggers, MD, [facility] 02/04/2014

## 2014-02-04 NOTE — Discharge Summary (Signed)
PATIENT DETAILS Name: Clarence Pollard Age: 73 y.o. Sex: male Date of Birth: 07-Jul-1940 MRN: [id number]. Admitting Physician: Etta Quill, DO HWE:[id number],RCVELF Danne Baxter, MD  Admit Date: 01/26/2014 Discharge date: 02/04/2014  Recommendations for Outpatient Follow-up:  1. Taper Decardron to discontinue in 10 days 2. Will need follow up with oncologist-Dr. Alen Blew upon discharge from [facility].  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 History of radiation therapy 10/14/12-10/25/12    T7-T11   . Prostate cancer   . Metastatic cancer to spine     DISCHARGE MEDICATIONS: Current Discharge Medication List    CONTINUE these medications which have NOT CHANGED   Details  benztropine (COGENTIN) 0.5 MG tablet Take 0.5 mg by mouth every 12 (twelve) hours.    bicalutamide (CASODEX) 50 MG tablet Take 50 mg by mouth daily.    brimonidine (ALPHAGAN P) 0.1 % SOLN Apply 1 drop to eye at bedtime. Both eyes    Calcium Carbonate-Vitamin D (CALCIUM-VITAMIN D) 500-200 MG-UNIT per tablet Take 1 tablet by mouth 2 (two) times daily with a meal.    ergocalciferol (VITAMIN D2) 50000 UNITS capsule Take 50,000 Units by mouth once a week. Every Saturday.    ferrous sulfate 325 (65 FE) MG tablet Take 325 mg by mouth at bedtime.    ibuprofen (ADVIL,MOTRIN) 600 MG tablet Take 600 mg by mouth 2 (two) times daily as needed for moderate pain (pain).    OVER THE COUNTER MEDICATION Take by mouth daily. Calcium Citrate 600 mg + Vit D 800 i.u. (2 Tablets daily).    travoprost, benzalkonium, (TRAVATAN) 0.004 % ophthalmic solution Place 1 drop into both eyes at bedtime.     traZODone (DESYREL) 25 mg TABS tablet Take 0.5 tablets (25 mg  total) by mouth at bedtime as needed for sleep. Qty: 30 tablet, Refills: 1        ALLERGIES:  No Known Allergies  BRIEF HPI:  See H&P, Labs, Consult and Test reports for all details in brief, patient is a 73 year old male with a history of prostate cancer who was treated with radiation to his spine last year presented to the ED with bilateral lower extremity weakness. Further workup which included a MRI of the spine showed spinal cord compression.  CONSULTATIONS:   hematology/oncology and Neurosurgery  PERTINENT RADIOLOGIC STUDIES: Mr Thoracic Spine Wo Contrast  01/26/2014   CLINICAL DATA:  Bilateral lower extremity weakness for a few months, increasing over last 5 days. Severe pain with activity. History of metastatic prostate cancer. History of T7 through T11 radiation for prostate metastasis, completed August 2014.  EXAM: MRI THORACIC AND LUMBAR SPINE WITHOUT CONTRAST  TECHNIQUE: Multiplanar and multiecho pulse sequences of the thoracic and lumbar spine were obtained without intravenous contrast. The patient did not wish to receive contrast.  COMPARISON:  MRI of the thoracic spine October 11, 2012  FINDINGS: MR THORACIC SPINE FINDINGS  Worsening diffuse osseous metastasis with low signal presumed sclerotic lesions. Lumbar vertebral bodies appear intact. Bright T1 signal within the T7 through T12 vertebral bodies consistent with radiation change. Minimal STIR signal associated with the low signal lesions consistent with bone marrow edema. No pathologic fracture.  Increasing soft tissue/tumor from T  8 through T10, extending into the epidural space (extending 4.3 cm in craniocaudad dimension) resulting in severe canal stenosis at T9, in trefoil configuration, AP dimension of the thecal sac is 5 mm with cord compression. No convincing evidence of spinal cord edema. Tumor extends into the neural foramen resulting in at least moderate neural foraminal narrowing at T9-10. No syrinx. Soft tissue/tumor  extends into the prevertebral soft tissues at T9.  Stranding of the RIGHT lung at the level of T8 through T10 may reflect postradiation change, incompletely characterized. Severe paraspinal muscle atrophy.  MR LUMBAR SPINE FINDINGS  Sagittal T2 sequence not obtained.  Lumbar vertebral bodies appear intact and aligned with maintenance of lumbar lordosis. Low signal metastasis an L1, and L2, without pathologic fracture. Severe L5-S1 degenerative disc. Severe subacute to chronic discogenic endplate change at A3-F5, mild to moderate chronic discogenic endplate changes at D3-2, L3-4 and L4-5.  Conus medullaris terminates at L1 and appears normal morphology and signal characteristics. Slightly thickened appearance of the cauda equina, for example axial 38/62 could be accentuated by spinal canal is is an redundancy. Prevertebral soft tissues are nonsuspicious. Moderate to severe paraspinal muscle atrophy. Ankylosis of the sacroiliac joints.  Level by level evaluation (limited axial axial T2 sequence due to motion):  L1-2: Small broad-based disc bulge. Moderate facet arthropathy and ligamentum flavum redundancy without canal stenosis or neural foraminal narrowing.  L2-3: Small broad-based disc bulge with superimposed small bowel subarticular extra foraminal disc protrusion encroaching upon the exited LEFT L2 nerve. Moderate facet arthropathy and ligamentum flavum redundancy. Mild canal stenosis. Mild RIGHT, mild to moderate LEFT neural foraminal narrowing.  L3: Small broad-based disc bulge eccentric laterally may be 6 L3 nerve. Moderate to severe facet arthropathy and ligamentum flavum redundancy with trace first facet effusion which is likely reactive. Moderate canal stenosis. Moderate RIGHT, mild to moderate LEFT neural narrowing.  L4-5: Moderate to large broad-based disc bulge. Severe facet arthropathy and ligamentum flavum with 2 mm bilateral facet effusions which are likely reactive. Severe canal stenosis, AP dimension  of the spinal canal is 3 mm. Moderate RIGHT, severe LEFT neural foraminal narrowing.  L5-S1: Moderate to large broad-based disc bulge asymmetric to the RIGHT may encroach upon the exited RIGHT L5 nerve. Partial effacement of RIGHT lateral recess may affect the traversing RIGHT S1 nerve. Moderate facet arthropathy and ligamentum flavum redundancy without canal stenosis. Moderate to severe RIGHT, moderate LEFT neural foraminal narrowing.  IMPRESSION: MR THORACIC SPINE IMPRESSION  Postradiation changes of the mid thoracic spine with progressed diffuse osseous metastasis, no pathologic fracture.  Increasing epidural confluent tumor from T8 through T10 resulting in severe canal stenosis and cord compression at T9, without spinal cord edema or syrinx.  MR LUMBAR SPINE IMPRESSION  Diffuse osseous metastasis without pathologic fracture or malalignment. Thickened versus redundant cauda equina, limited assessment for leptomeningeal metastasis on this nonenhanced examination.  Lumbar spondylosis.  Severe canal stenosis at L4-5.  Neural foraminal narrowing L2-3 to L5-S1: Severe on the LEFT at L4-5.  Acute findings discussed with and reconfirmed by Dr.ELLIOTT WENTZ on 01/26/2014 at 10:42 pm.   Electronically Signed   By: Elon Alas   On: 01/26/2014 22:37   Mr Lumbar Spine Wo Contrast  01/26/2014   CLINICAL DATA:  Bilateral lower extremity weakness for a few months, increasing over last 5 days. Severe pain with activity. History of metastatic prostate cancer. History of T7 through T11 radiation for prostate metastasis, completed August 2014.  EXAM: MRI THORACIC AND LUMBAR SPINE WITHOUT CONTRAST  TECHNIQUE: Multiplanar and multiecho pulse sequences of the thoracic and lumbar spine were obtained without intravenous contrast. The patient did not wish to receive contrast.  COMPARISON:  MRI of the thoracic spine October 11, 2012  FINDINGS: MR THORACIC SPINE FINDINGS  Worsening diffuse osseous metastasis with low signal presumed  sclerotic lesions. Lumbar vertebral bodies appear intact. Bright T1 signal within the T7 through T12 vertebral bodies consistent with radiation change. Minimal STIR signal associated with the low signal lesions consistent with bone marrow edema. No pathologic fracture.  Increasing soft tissue/tumor from T 8 through T10, extending into the epidural space (extending 4.3 cm in craniocaudad dimension) resulting in severe canal stenosis at T9, in trefoil configuration, AP dimension of the thecal sac is 5 mm with cord compression. No convincing evidence of spinal cord edema. Tumor extends into the neural foramen resulting in at least moderate neural foraminal narrowing at T9-10. No syrinx. Soft tissue/tumor extends into the prevertebral soft tissues at T9.  Stranding of the RIGHT lung at the level of T8 through T10 may reflect postradiation change, incompletely characterized. Severe paraspinal muscle atrophy.  MR LUMBAR SPINE FINDINGS  Sagittal T2 sequence not obtained.  Lumbar vertebral bodies appear intact and aligned with maintenance of lumbar lordosis. Low signal metastasis an L1, and L2, without pathologic fracture. Severe L5-S1 degenerative disc. Severe subacute to chronic discogenic endplate change at L8-L3, mild to moderate chronic discogenic endplate changes at T3-4, L3-4 and L4-5.  Conus medullaris terminates at L1 and appears normal morphology and signal characteristics. Slightly thickened appearance of the cauda equina, for example axial 38/62 could be accentuated by spinal canal is is an redundancy. Prevertebral soft tissues are nonsuspicious. Moderate to severe paraspinal muscle atrophy. Ankylosis of the sacroiliac joints.  Level by level evaluation (limited axial axial T2 sequence due to motion):  L1-2: Small broad-based disc bulge. Moderate facet arthropathy and ligamentum flavum redundancy without canal stenosis or neural foraminal narrowing.  L2-3: Small broad-based disc bulge with superimposed small  bowel subarticular extra foraminal disc protrusion encroaching upon the exited LEFT L2 nerve. Moderate facet arthropathy and ligamentum flavum redundancy. Mild canal stenosis. Mild RIGHT, mild to moderate LEFT neural foraminal narrowing.  L3: Small broad-based disc bulge eccentric laterally may be 6 L3 nerve. Moderate to severe facet arthropathy and ligamentum flavum redundancy with trace first facet effusion which is likely reactive. Moderate canal stenosis. Moderate RIGHT, mild to moderate LEFT neural narrowing.  L4-5: Moderate to large broad-based disc bulge. Severe facet arthropathy and ligamentum flavum with 2 mm bilateral facet effusions which are likely reactive. Severe canal stenosis, AP dimension of the spinal canal is 3 mm. Moderate RIGHT, severe LEFT neural foraminal narrowing.  L5-S1: Moderate to large broad-based disc bulge asymmetric to the RIGHT may encroach upon the exited RIGHT L5 nerve. Partial effacement of RIGHT lateral recess may affect the traversing RIGHT S1 nerve. Moderate facet arthropathy and ligamentum flavum redundancy without canal stenosis. Moderate to severe RIGHT, moderate LEFT neural foraminal narrowing.  IMPRESSION: MR THORACIC SPINE IMPRESSION  Postradiation changes of the mid thoracic spine with progressed diffuse osseous metastasis, no pathologic fracture.  Increasing epidural confluent tumor from T8 through T10 resulting in severe canal stenosis and cord compression at T9, without spinal cord edema or syrinx.  MR LUMBAR SPINE IMPRESSION  Diffuse osseous metastasis without pathologic fracture or malalignment. Thickened versus redundant cauda equina, limited assessment for leptomeningeal metastasis on this nonenhanced examination.  Lumbar spondylosis.  Severe canal stenosis at L4-5.  Neural foraminal narrowing L2-3  to L5-S1: Severe on the LEFT at L4-5.  Acute findings discussed with and reconfirmed by Dr.ELLIOTT WENTZ on 01/26/2014 at 10:42 pm.   Electronically Signed   By:  Elon Alas   On: 01/26/2014 22:37   Mr Thoracic Spine W Wo Contrast  01/28/2014   CLINICAL DATA:  Metastatic prostate cancer to the thoracic and lumbar spine. Spinal stenosis.  EXAM: MRI THORACIC AND LUMBAR SPINE WITHOUT AND WITH CONTRAST  TECHNIQUE: Multiplanar and multiecho pulse sequences of the thoracic and lumbar spine were obtained without and with intravenous contrast.  CONTRAST:  61mL MULTIHANCE GADOBENATE DIMEGLUMINE 529 MG/ML IV SOLN  COMPARISON:  MRI of the thoracic and lumbar spine without contrast 01/26/2014.  FINDINGS: MR THORACIC SPINE FINDINGS  Normal signal is present in the thoracic spinal cord. Diffuse metastatic disease is present. There is tumor infiltration at T6 and diffusely on the left at T8, T9, and T10. There is extraosseous tumor extension at the T9 level to the left greater than right. There is significant compression of the central spinal cord at the T9 level is previously documented. Osseous foraminal narrowing bilaterally is worse on the left at T9-10. There is mild foraminal narrowing bilaterally at T8-9 and T10-11 due to tumor encroachment. Tumor extends into the pedicles bilaterally at T8, T9, and T10. There is tumor in the left portion of the vertebral body at T7 and T11. A 14 mm lesion is present within the right aspect vertebral body at T12.  The postcontrast images demonstrate enhancement of tumor within the bone as described. There is additionally dural enhancement and tumor infiltration diffusely from T8-9 through T10-11. The axial images are somewhat distorted by patient motion particularly through this area. The extraosseous soft tissue at the T9 level demonstrates enhancement as well.  MR LUMBAR SPINE FINDINGS  The postcontrast images of lumbar spine are significantly degraded by patient motion.  Tumor infiltration is present within the posterior vertebral body at L2 without significant extraosseous tumor. Chronic endplate marrow changes are present at L5-S1. No  other definite tumor is present. Limited imaging of the abdomen is unremarkable. There is no significant adenopathy.  L1-2: Leftward disc bulging and mild facet hypertrophy are present without significant stenosis.  L2-3: A leftward disc protrusion is present. Moderate facet hypertrophy is evident. This results in mild to moderate left subarticular and foraminal narrowing. Right subarticular and foraminal narrowing is mild.  L3-4: A broad-based disc protrusion and moderate facet hypertrophy results an moderate subarticular and foraminal narrowing bilaterally, slightly worse on the right.  L4-5: Severe central canal stenosis is due to a broad-based disc protrusion and advanced facet hypertrophy. Moderate left and mild to moderate right foraminal stenosis is present.  L5-S1: A rightward disc protrusion is present. Moderate facet hypertrophy is noted. This results in moderate right subarticular stenosis and mild left subarticular narrowing. Mild to moderate foraminal narrowing is worse on the right.  IMPRESSION: 1. Moderate to severe central canal stenosis at T9 secondary to circumferential tumor involving the T8, T9, and T10 vertebral bodies. 2. Extraosseous tumor on the left at T9. 3. Dural-based tumor extends from T8-9 through T10-11, worse on the left. 4. Foraminal narrowing is present bilaterally from T8-9 through T10-11. It is greatest at T9-10. 5. Tumor within the posterior body of L2 without collapse or extraosseous extension. 6. Spondylosis of the lumbar spine is unrelated to tumor. 7. Mild to moderate left subarticular and foraminal narrowing at L2-3. 8. Moderate subarticular and foraminal stenosis bilaterally at L3-4 is worse on  the right. 9. Severe central canal stenosis at L4-5 secondary to a broad-based disc protrusion and advanced facet hypertrophy. 10. Moderate left and mild to moderate right foraminal narrowing at L4-5. 11. Rightward disc protrusion with right subarticular and bilateral foraminal  narrowing at L5-S1.   Electronically Signed   By: Lawrence Santiago M.D.   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 Spinal stenosis.  EXAM: MRI THORACIC AND LUMBAR SPINE WITHOUT AND WITH CONTRAST  TECHNIQUE: Multiplanar and multiecho pulse sequences of the thoracic and lumbar spine were obtained without and with intravenous contrast.  CONTRAST:  84mL MULTIHANCE GADOBENATE DIMEGLUMINE 529 MG/ML IV SOLN  COMPARISON:  MRI of the thoracic and lumbar spine without contrast 01/26/2014.  FINDINGS: MR THORACIC SPINE FINDINGS  Normal signal is present in the thoracic spinal cord. Diffuse metastatic disease is present. There is tumor infiltration at T6 and diffusely on the left at T8, T9, and T10. There is extraosseous tumor extension at the T9 level to the left greater than right. There is significant compression of the central spinal cord at the T9 level is previously documented. Osseous foraminal narrowing bilaterally is worse on the left at T9-10. There is mild foraminal narrowing bilaterally at T8-9 and T10-11 due to tumor encroachment. Tumor extends into the pedicles bilaterally at T8, T9, and T10. There is tumor in the left portion of the vertebral body at T7 and T11. A 14 mm lesion is present within the right aspect vertebral body at T12.  The postcontrast images demonstrate enhancement of tumor within the bone as described. There is additionally dural enhancement and tumor infiltration diffusely from T8-9 through T10-11. The axial images are somewhat distorted by patient motion particularly through this area. The extraosseous soft tissue at the T9 level demonstrates enhancement as well.  MR LUMBAR SPINE FINDINGS  The postcontrast images of lumbar spine are significantly degraded by patient motion.  Tumor infiltration is present within the posterior vertebral body at L2 without significant extraosseous tumor.  Chronic endplate marrow changes are present at L5-S1. No other definite tumor is present. Limited imaging of the abdomen is unremarkable. There is no significant adenopathy.  L1-2: Leftward disc bulging and mild facet hypertrophy are present without significant stenosis.  L2-3: A leftward disc protrusion is present. Moderate facet hypertrophy is evident. This results in mild to moderate left subarticular and foraminal narrowing. Right subarticular and foraminal narrowing is mild.  L3-4: A broad-based disc protrusion and moderate facet hypertrophy results an moderate subarticular and foraminal narrowing bilaterally, slightly worse on the right.  L4-5: Severe central canal stenosis is due to a broad-based disc protrusion and advanced facet hypertrophy. Moderate left and mild to moderate right foraminal stenosis is present.  L5-S1: A rightward disc protrusion is present. Moderate facet hypertrophy is noted. This results in moderate right subarticular stenosis and mild left subarticular narrowing. Mild to moderate foraminal narrowing is worse on the right.  IMPRESSION: 1. Moderate to severe central canal stenosis at T9 secondary to circumferential tumor involving the T8, T9, and T10 vertebral bodies. 2. Extraosseous tumor on the left at T9. 3. Dural-based tumor extends from T8-9 through T10-11, worse on the left. 4. Foraminal narrowing is present bilaterally from T8-9 through T10-11. It is greatest at T9-10. 5. Tumor within the posterior body of L2 without collapse or extraosseous extension. 6. Spondylosis of the lumbar spine is unrelated to tumor. 7.  Mild to moderate left subarticular and foraminal narrowing at L2-3. 8. Moderate subarticular and foraminal stenosis bilaterally at L3-4 is worse on the right. 9. Severe central canal stenosis at L4-5 secondary to a broad-based disc protrusion and advanced facet hypertrophy. 10. Moderate left and mild to moderate right foraminal narrowing at L4-5. 11. Rightward disc  protrusion with right subarticular and bilateral foraminal narrowing at L5-S1.   Electronically Signed   By: Lawrence Santiago M.D.   On: 01/28/2014 21:13   Dg Thoracic Spine 1 View  01/29/2014   CLINICAL DATA:  Patient with metastatic prostate carcinoma resulting in spinal stenosis. Tumor removal.  EXAM: DG C-ARM 61-120 MIN; THORACIC SPINE - 1 VIEW  COMPARISON:  MRI thoracic spine 01/28/2014.  FINDINGS: We are provided with a single intraoperative fluoroscopic spot view of the thoracic spine in the AP projection. Image demonstrates tissues spreaders and probes in place for tumor resection.  IMPRESSION: Thoracic spine tumor resection in progress.   Electronically Signed   By: Inge Rise M.D.   On: 01/29/2014 23:02   Dg Chest Port 1 View  01/29/2014   CLINICAL DATA:  Central line placement.  EXAM: PORTABLE CHEST - 1 VIEW  COMPARISON:  None.  FINDINGS: Mildly enlarged cardiac silhouette. Mild bibasilar atelectasis. Right jugular catheter tip in the superior vena cava. No pneumothorax. Thoracic spine degenerative changes.  IMPRESSION: 1. Right jugular catheter tip in the superior vena cava without pneumothorax. 2. Mild cardiomegaly and mild bibasilar atelectasis.   Electronically Signed   By: Enrique Sack M.D.   On: 01/29/2014 19:46   Dg C-arm 1-60 Min  01/29/2014   CLINICAL DATA:  Patient with metastatic prostate carcinoma resulting in spinal stenosis. Tumor removal.  EXAM: DG C-ARM 61-120 MIN; THORACIC SPINE - 1 VIEW  COMPARISON:  MRI thoracic spine 01/28/2014.  FINDINGS: We are provided with a single intraoperative fluoroscopic spot view of the thoracic spine in the AP projection. Image demonstrates tissues spreaders and probes in place for tumor resection.  IMPRESSION: Thoracic spine tumor resection in progress.   Electronically Signed   By: Inge Rise M.D.   On: 01/29/2014 23:02     PERTINENT LAB RESULTS: CBC:  Recent Labs  02/03/14 0602  WBC 13.9*  HGB 10.4*  HCT 34.3*  PLT 214    CMET CMP     Component Value Date/Time   NA 137 02/03/2014 0602   NA 144 07/22/2013 1357   K 4.5 02/03/2014 0602   K 3.8 07/22/2013 1357   CL 98 02/03/2014 0602   CO2 27 02/03/2014 0602   CO2 25 07/22/2013 1357   GLUCOSE 147* 02/03/2014 0602   GLUCOSE 130 07/22/2013 1357   BUN 25* 02/03/2014 0602   BUN 14.7 07/22/2013 1357   CREATININE 0.70 02/03/2014 0602   CREATININE 1.1 07/22/2013 1357   CALCIUM 7.0* 02/03/2014 0602   CALCIUM 9.2 07/22/2013 1357   PROT 9.2* 01/26/2014 1647   PROT 7.5 07/22/2013 1357   ALBUMIN 3.9 01/26/2014 1647   ALBUMIN 3.3* 07/22/2013 1357   AST 22 01/26/2014 1647   AST 14 07/22/2013 1357   ALT 14 01/26/2014 1647   ALT 9 07/22/2013 1357   ALKPHOS 183* 01/26/2014 1647   ALKPHOS 111 07/22/2013 1357   BILITOT 0.5 01/26/2014 1647   BILITOT 0.42 07/22/2013 1357   GFRNONAA >90 02/03/2014 0602   GFRAA >90 02/03/2014 0602    GFR Estimated Creatinine Clearance: 109.9 mL/min (by C-G formula based on Cr of 0.7). No results for input(s):  LIPASE, AMYLASE in the last 72 hours. No results for input(s): CKTOTAL, CKMB, CKMBINDEX, TROPONINI in the last 72 hours. Invalid input(s): POCBNP No results for input(s): DDIMER in the last 72 hours. No results for input(s): HGBA1C in the last 72 hours. No results for input(s): CHOL, HDL, LDLCALC, TRIG, CHOLHDL, LDLDIRECT in the last 72 hours. No results for input(s): TSH, T4TOTAL, T3FREE, THYROIDAB in the last 72 hours.  Invalid input(s): FREET3 No results for input(s): VITAMINB12, FOLATE, FERRITIN, TIBC, IRON, RETICCTPCT in the last 72 hours. Coags: No results for input(s): INR in the last 72 hours.  Invalid input(s): PT Microbiology: Recent Results (from the past 240 hour(s))  MRSA PCR Screening     Status: None   Collection Time: 01/27/14  2:39 AM  Result Value Ref Range Status   MRSA by PCR NEGATIVE NEGATIVE Final    Comment:        The GeneXpert MRSA Assay (FDA approved for NASAL specimens only), is one  component of a comprehensive MRSA colonization surveillance program. It is not intended to diagnose MRSA infection nor to guide or monitor treatment for MRSA infections.      BRIEF HOSPITAL COURSE:   Principal Problem:   Spinal cord compression:secondary to metastatic prostate cancer. He was admitted, MRI confirmed cord compression. He was started on Decadron. He was evaluated by neurosurgery, underwent T8-T10 thoracic laminectomy with resection of the tumor on 01/29/14. No major issues postoperatively, patient is doing rather well neurologically. He was evaluated by rehabilitation services, and is deemed to be a candidate for acute rehabilitation at [facility]. Spoke with neurosurgery-Dr.Nudelman-were advised to taper of Decadron and around 10 days' time. A bed is available at [facility] on 11/18, he is being transferred to [facility] in a stable condition.Strength in lower extremity bilaterally is around 5/5 on my exam.Patient will need follow-up with his primary oncologist-Dr. Alen Blew upon discharge from [facility].  Active Problems:   Malignant neoplasm of prostate:causing above, will continue with Casodex.Patient will need follow-up with his primary oncologist-Dr. Alen Blew discharge from [place].    Insomnia Continue trazodone 25 mg QHS  TODAY-DAY OF DISCHARGE:  Subjective:   Chace Klippel today has no headache,no chest abdominal pain,no new weakness tingling or numbness, feels much better   Objective:   Blood pressure 111/63, pulse 88, temperature 98.2 F (36.8 C), temperature source Oral, resp. rate 18, height 6' (1.829 m), weight 116.3 kg (256 lb 6.3 oz), SpO2 98 %.  Intake/Output Summary (Last 24 hours) at 02/04/14 1053 Last data filed at 02/04/14 0811  Gross per 24 hour  Intake    240 ml  Output    550 ml  Net   -310 ml   Filed Weights   01/27/14 0233  Weight: 116.3 kg (256 lb 6.3 oz)    Exam Awake Alert, Oriented *3, No new F.N deficits, Normal affect [place].AT,PERRAL Supple Neck,No JVD, No  cervical lymphadenopathy appriciated.  Symmetrical Chest wall movement, Good air movement bilaterally, CTAB RRR,No Gallops,Rubs or new Murmurs, No Parasternal Heave +ve B.Sounds, Abd Soft, Non tender, No organomegaly appriciated, No rebound -guarding or rigidity. No Cyanosis, Clubbing or edema, No new Rash or bruise  DISCHARGE CONDITION: Stable  DISPOSITION: CIR  DISCHARGE INSTRUCTIONS:    Activity:  As tolerated with Full fall precautions use walker/cane & assistance as needed  Diet recommendation: Diabetic Diet  Discharge Instructions    Increase activity slowly    Complete by:  As directed            Follow-up  Information    Follow up with [name], MD. Schedule an appointment as soon as possible for a visit in 1 week.   Specialty:  Family Medicine   Contact information:   [address] [place] [address] [phone]       Follow up with [facility], [facility]. Schedule an appointment as soon as possible for a visit in 1 week.   Specialty:  Oncology   Why:  after discharge from [facility] information:   [place]. [place] [address] [phone]       Total Time spent on discharge equals 45 minutes.  SignedOren Binet 02/04/2014 10:53 AM

## 2014-02-04 NOTE — Progress Notes (Signed)
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 Referring Physician: Dr. Eliseo Squires   HPI: Clarence Pollard is a 73 y.o. 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 He was admitted on 01/26/14 with progressive weakness BLE with increased pain from lower thoracic spine to BLE with numbness and difficulty walking. MRI of spine with increasing epidural confluent tumor from T8 - T10 resulting in severe canal stenosis and cord compression at T9, diffuse osseous mets as well as severe lumbar spondylosis with canal stenosis L5-S1 and neural foraminal narrowing L2/3- L5/S1. He was started on IV steroids and evaluated by Dr. Rita Ohara who recommended thoracic laminectomy for decompression of tumor to help with current symptoms and elected lumbar decompression in the future for neurogenic claudication. Patient underwent T8-T10 thoracic laminectomy with resection of tumor and decompression of spinal cord on 01/29/14. Dr. Lisbeth Renshaw consulted for input and recommends XRT with CT myelogram in the future. Dr. Alen Blew following for input and recommends systemic therapy as (PSA up to 10) once this episode resolves. OT evaluation done today and CIR recommended for follow up therapy.    Review of Systems  HENT: Negative for hearing loss.  Eyes: Negative for blurred vision and double vision.  Respiratory: Negative for shortness of breath.  Cardiovascular: Negative for chest pain and palpitations.  Gastrointestinal: Negative for heartburn and nausea.  Genitourinary: Positive for frequency.  Musculoskeletal: Positive for myalgias and back pain.  Neurological: Positive for sensory change and focal weakness. Negative for dizziness, tingling and headaches.      Past Medical History  Diagnosis Date  . History of radiation therapy  10/14/12-10/25/12    T7-T11   . Prostate cancer   . Metastatic cancer to spine     Past Surgical History  Procedure Laterality Date  . Appendectomy    . Colon surgery    . Cholecystectomy    . Cataract extraction Bilateral   . Laminectomy N/A 01/29/2014    Procedure: Thoracic laminectomy for tumor resection; Surgeon: Hosie Spangle, MD; Location: [facility]; Service: Neurosurgery; Laterality: N/A; Thoracic laminectomy for tumor resection    History reviewed. No pertinent family history.    Social History: Widowed last year. Independent PTA--plans on transitioning to ALF past discharge. He reports that he has quit smoking in his 20's. He does not have any smokeless tobacco history on file. He reports that he does not drink alcohol or use illicit drugs.    Allergies: No Known Allergies    Medications Prior to Admission  Medication Sig Dispense Refill  . benztropine (COGENTIN) 0.5 MG tablet Take 0.5 mg by mouth every 12 (twelve) hours.    . bicalutamide (CASODEX) 50 MG tablet Take 50 mg by mouth daily.    . brimonidine (ALPHAGAN P) 0.1 % SOLN Apply 1 drop to eye at bedtime. Both eyes    . Calcium Carbonate-Vitamin D (CALCIUM-VITAMIN D) 500-200 MG-UNIT per tablet Take 1 tablet by mouth 2 (two) times daily with a meal.    . ergocalciferol (VITAMIN D2) 50000 UNITS capsule Take 50,000 Units by mouth once a week. Every Saturday.    . ferrous sulfate 325 (65 FE) MG tablet Take 325 mg by mouth at bedtime.    Marland Kitchen ibuprofen (ADVIL,MOTRIN) 600 MG tablet Take 600 mg by mouth 2 (two) times daily as needed for moderate pain (pain).    Marland Kitchen OVER  THE COUNTER MEDICATION Take by mouth daily. Calcium Citrate 600 mg + Vit D 800 i.u. (2 Tablets daily).    . travoprost, benzalkonium, (TRAVATAN) 0.004 % ophthalmic solution Place 1 drop into both eyes at bedtime.     . traZODone (DESYREL) 25 mg TABS tablet  Take 0.5 tablets (25 mg total) by mouth at bedtime as needed for sleep. 30 tablet 1    Home: Home Living Family/patient expects to be discharged to:: Inpatient rehab Living Arrangements: Alone Home Equipment: Kasandra Knudsen - single point, Shower seat, Environmental consultant - 2 wheels  Functional History: Prior Function Level of Independence: Independent with assistive device(s) Comments: using SPC PTA. Functional Status:  Mobility: Bed Mobility Overal bed mobility: Needs Assistance Bed Mobility: Rolling, Sidelying to Sit Rolling: Supervision (with cues for technique) Supine to sit: Min assist (with HOB down and use of rail and increased time) Transfers Overall transfer level: Needs assistance Equipment used: Rolling walker (2 wheeled) Transfers: Sit to/from Stand Sit to Stand: Min assist (with momentum to get up) General transfer comment: required momentum to get up, ataxic-heavy footed gait      ADL: ADL Overall ADL's : Needs assistance/impaired Eating/Feeding: Independent, Sitting Grooming: Set up, Sitting Upper Body Bathing: Set up, Sitting Lower Body Bathing: Moderate assistance (with min A sit<>stand) Upper Body Dressing : Set up, Sitting Lower Body Dressing: Maximal assistance (with min A sit<>stand) Toilet Transfer: Minimal assistance (heavily relying on the walker and the grab bar) Toileting- Clothing Manipulation and Hygiene: Moderate assistance (with min A sit<>stand using RW and grab bar)  Cognition: Cognition Overall Cognitive Status: Within Functional Limits for tasks assessed Orientation Level: Oriented X4 Cognition Arousal/Alertness: Awake/alert Behavior During Therapy: WFL for tasks assessed/performed Overall Cognitive Status: Within Functional Limits for tasks assessed  Blood pressure 103/54, pulse 100, temperature 97.6 F (36.4 C), temperature source Oral, resp. rate 18, height 6' (1.829 m), weight 116.3 kg (256 lb 6.3 oz), SpO2 97 %. Physical Exam  Nursing note and  vitals reviewed. Constitutional: He is oriented to person, place, and time. He appears well-developed and well-nourished.  HENT:  Head: Normocephalic and atraumatic.  Eyes: Conjunctivae are normal. Pupils are equal, round, and reactive to light.  Neck: Normal range of motion. Neck supple.  Cardiovascular: Normal rate and regular rhythm.  Respiratory: Effort normal and breath sounds normal. No respiratory distress. He has no wheezes.  GI: Soft. Bowel sounds are normal.  Neurological: He is alert and oriented to person, place, and time.  Follows commands without difficulty. Decrease in fine motor movements R>LLE. Mild sensory deficits BLE and reports dysesthesias abdominal area. . 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 Otherwise UE's 4/5. LE: 4- hf, 4 ke and 4/5 adf/apf. Cognitively intact  Skin: Skin is warm and dry.  Psychiatric: He has a normal mood and affect. His behavior is normal. Judgment and thought content normal.     Lab Results Last 24 Hours    Results for orders placed or performed during the hospital encounter of 01/26/14 (from the past 24 hour(s))  Glucose, capillary Status: Abnormal   Collection Time: 02/01/14 5:18 PM  Result Value Ref Range   Glucose-Capillary 174 (H) 70 - 99 mg/dL  Glucose, capillary Status: Abnormal   Collection Time: 02/01/14 9:09 PM  Result Value Ref Range   Glucose-Capillary 204 (H) 70 - 99 mg/dL   Comment 1 Documented in Chart    Comment 2 Notify RN   Glucose, capillary Status: Abnormal   Collection Time: 02/02/14 6:35 AM  Result Value Ref  Range   Glucose-Capillary 155 (H) 70 - 99 mg/dL   Comment 1 Documented in Chart    Comment 2 Notify RN   Glucose, capillary Status: Abnormal   Collection Time: 02/02/14 11:11 AM  Result Value Ref Range   Glucose-Capillary 299 (H) 70 - 99 mg/dL      Imaging Results (Last 48 hours)    No results found.     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 Does the need for close, 24 hr/day medical supervision in concert with the patient's rehab needs make it unreasonable for this patient to be served in a less intensive setting? Yes 2. Co-Morbidities requiring supervision/potential complications: adhesive capsulitis left shoulder, OA left shoulder 3. Due to bladder management, bowel management, safety, skin/wound care, disease management, medication administration, pain management and patient education, does the patient require 24 hr/day rehab nursing? Yes 4. Does the patient require coordinated care of a physician, rehab nurse, PT (1-2 hrs/day, 5 days/week) and OT (1-2 hrs/day, 5 days/week) to address physical and functional deficits in the context of the above medical diagnosis(es)? Yes Addressing deficits in the following areas: balance, endurance, locomotion, strength, transferring, bowel/bladder control, bathing, dressing, feeding, grooming, toileting and psychosocial support 5. Can the patient actively participate in an intensive therapy program of at least 3 hrs of therapy per day at least 5 days per week? Yes 6. The potential for patient to make measurable gains while on inpatient rehab is excellent 7. Anticipated functional outcomes upon discharge from inpatient rehab are modified independent with PT, modified independent with OT, n/a with SLP. 8. Estimated rehab length of stay to reach the above functional goals is: 9-14 days 9. Does the patient have adequate social supports and living environment to accommodate these discharge functional goals? Yes 10. Anticipated D/C setting: Home 11. Anticipated post D/C treatments: HH therapy and Outpatient therapy 12. Overall Rehab/Functional Prognosis: excellent  RECOMMENDATIONS: This patient's condition is appropriate for continued rehabilitative care in the following setting: CIR Patient has agreed to participate  in recommended program. Yes Note that insurance prior authorization may be required for reimbursement for recommended care.  Comment: Rehab Admissions Coordinator to follow up.  Thanks,  Meredith Staggers, MD, Mellody Drown \    02/02/2014       Revision History     Date/Time User Provider Type Action   02/03/2014 9:35 AM Meredith Staggers, MD Physician Sign   02/02/2014 3:01 PM Bary Leriche, PA-C Physician Assistant Share   View Details Report       Routing History     Date/Time From To Method   02/03/2014 9:35 AM Meredith Staggers, MD Meredith Staggers, MD In Basket   02/03/2014 9:35 AM Meredith Staggers, MD [name], MD Fax

## 2014-02-04 NOTE — Progress Notes (Signed)
PMR Admission Coordinator Pre-Admission Assessment  Patient: Clarence Pollard is an 73 y.o., male MRN: [id number] DOB: 12-17-1940 Height: 6' (182.9 cm) Weight: 116.3 kg (256 lb 6.3 oz)  Insurance Information HMO: yes PPO: PCP: IPA: 80/20: OTHER: medicare advantage plan PRIMARY: [employer] Medicare Policy#: [id number] Subscriber: pt CM Name: Joaquin Music Phone#: [phone] Fax#: [phone] Pre-Cert#: [id number] Employer: retired f/u CM is Geryl Rankins [id number] Benefits: Phone #: [phone] Name: 02/03/14 Eff. Date: 03/20/13 Deduct: none Out of Pocket Max: $4900 met 4271.53 Life Max: none CIR: $345 per day days 1 thru 5 SNF: no copay days 1-20; $155 per day days 21-52; no copay days 53-100 Outpatient: $40 copay per visit Co-Pay: no visit limit Home Health: 100% Co-Pay: no visit limit DME: 80% Co-Pay: 20% Providers: pt choice  SECONDARY: none   Medicaid Application Date: Case Manager:  Disability Application Date: Case Worker:   Emergency Contact Information Contact Information    Name Relation Home Work [place] Daughter [phone]      Current Medical History  Patient Admitting Diagnosis: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 He was admitted on 01/26/14 with progressive weakness BLE with increased pain from lower thoracic spine to BLE with numbness and difficulty walking. MRI of spine with increasing epidural confluent tumor from T8 - T10 resulting in severe canal stenosis and cord compression at T9, diffuse osseous mets as  well as severe lumbar spondylosis with canal stenosis L5-S1 and neural foraminal narrowing L2/3- L5/S1. He was started on IV steroids and evaluated by Dr. Rita Ohara who recommended thoracic laminectomy for decompression of tumor to help with current symptoms and elected lumbar decompression in the future for neurogenic claudication. Patient underwent T8-T10 thoracic laminectomy with resection of tumor and decompression of spinal cord on 01/29/14. Dr. Lisbeth Renshaw consulted for input and recommends XRT with CT myelogram in the future. Dr. Alen Blew following for input and recommends systemic therapy as (PSA up to 10) once this episode resolves.   Dr. Rita Ohara anticipating surgical radiosurgery to thoracic spine in 2 weeks. Pt states he his not committed to doing before the [place]. Decadron continuing to taper, discussed continued Decadron taper with Dr. Sloan Leiter.  Past Medical History  Past Medical History  Diagnosis Date  . History of radiation therapy 10/14/12-10/25/12    T7-T11   . Prostate cancer   . Metastatic cancer to spine     Family History  family history is not on file.  Prior Rehab/Hospitalizations: none  Current Medications  Current facility-administered medications: baclofen (LIORESAL) tablet 10 mg, 10 mg, Oral, BID, Hosie Spangle, MD, 10 mg at 02/04/14 1004; benztropine (COGENTIN) tablet 0.5 mg, 0.5 mg, Oral, Q12H, Jared M Gardner, DO, 0.5 mg at 02/04/14 1004; bicalutamide (CASODEX) tablet 50 mg, 50 mg, Oral, Daily, Jared M Gardner, DO, 50 mg at 02/04/14 1004 brimonidine (ALPHAGAN) 0.2 % ophthalmic solution 1 drop, 1 drop, Both Eyes, QHS, Rise Patience, MD, 1 drop at 02/03/14 2257; dexamethasone (DECADRON) injection 4 mg, 4 mg, Intravenous, Q12H, Jessica U Vann, DO; ferrous sulfate tablet 325 mg, 325 mg, Oral, QHS, Jared M Gardner, DO, 325 mg at 02/03/14 2257; ibuprofen (ADVIL,MOTRIN) tablet 600 mg, 600 mg, Oral, BID PRN, Etta Quill, DO Influenza  vac split quadrivalent PF (FLUARIX) injection 0.5 mL, 0.5 mL, Intramuscular, Prior to discharge, Allie Bossier, MD; insulin  aspart (novoLOG) injection 0-9 Units, 0-9 Units, Subcutaneous, TID WC, Cherene Altes, MD, 1 Units at 02/04/14 0701; latanoprost (XALATAN) 0.005 % ophthalmic solution 1 drop, 1 drop, Both Eyes, QHS, Richarda Blade, MD, 1 drop at 02/03/14 2257 morphine 2 MG/ML injection 1-2 mg, 1-2 mg, Intravenous, Q2H PRN, Hosie Spangle, MD; ondansetron [facility]) injection 4 mg, 4 mg, Intravenous, Q6H PRN, Cherene Altes, MD, 4 mg at 01/30/14 1150; oxyCODONE-acetaminophen (PERCOCET/ROXICET) 5-325 MG per tablet 1-2 tablet, 1-2 tablet, Oral, Q4H PRN, Hosie Spangle, MD, 2 tablet at 01/30/14 2036 pantoprazole (PROTONIX) EC tablet 40 mg, 40 mg, Oral, Q1200, Cherene Altes, MD, 40 mg at 02/03/14 1307; promethazine (PHENERGAN) injection 12.5 mg, 12.5 mg, Intravenous, Q6H PRN, Cherene Altes, MD; traZODone (DESYREL) tablet 25 mg, 25 mg, Oral, QHS PRN, Etta Quill, DO, 25 mg at 01/31/14 2228  Patients Current Diet: Diet Carb Modified  Precautions / Restrictions Precautions Precautions: Fall, Back Precaution Booklet Issued: No Restrictions Weight Bearing Restrictions: No   Prior Activity Level Community (5-7x/wk): independent up until few weeks pta. used cane. ongoing pain due to mets   Home Assistive Devices / Equipment Home Assistive Devices/Equipment: Eyeglasses Home Equipment: Kasandra Knudsen - single point, Civil engineer, contracting, Environmental consultant - 2 wheels  Prior Functional Level Prior Function Level of Independence: Independent with assistive device(s) Comments: using SPC PTA.  Current Functional Level Cognition  Overall Cognitive Status: Within Functional Limits for tasks assessed Orientation Level: Oriented X4   Extremity Assessment (includes Sensation/Coordination)          ADLs  Overall ADL's : Needs assistance/impaired Eating/Feeding: Independent,  Sitting Grooming: Set up, Sitting Upper Body Bathing: Set up, Sitting Lower Body Bathing: Moderate assistance (with min A sit<>stand) Upper Body Dressing : Set up, Sitting Lower Body Dressing: Maximal assistance (with min A sit<>stand) Toilet Transfer: Minimal assistance (heavily relying on the walker and the grab bar) Toileting- Clothing Manipulation and Hygiene: Moderate assistance (with min A sit<>stand using RW and grab bar)    Mobility  Overal bed mobility: Needs Assistance Bed Mobility: Rolling, Sidelying to Sit, Sit to Sidelying Rolling: Supervision Sidelying to sit: Min guard, HOB elevated Supine to sit: Min assist (with HOB down and use of rail and increased time) Sit to sidelying: Min guard General bed mobility comments: VC's for log roll technique. Min guard for safety. No physical assist required. Mild difficulty bringing BLEs into bed. Pt elevated HOB bed prior to sitting up for assist. use of rails.    Transfers  Overall transfer level: Needs assistance Equipment used: Rolling walker (2 wheeled) Transfers: Sit to/from Stand Sit to Stand: Min guard General transfer comment: VC's for hand placement as pt with tendency to pull onto walker to stand. Min guard for steadying upon standing. Stood from EOB and from chair x1.     Ambulation / Gait / Stairs / Wheelchair Mobility  Ambulation/Gait Ambulation/Gait assistance: Museum/gallery curator (Feet): 170 Feet Assistive device: Rolling walker (2 wheeled) Gait Pattern/deviations: Step-through pattern, Decreased stride length, Ataxic, Narrow base of support Gait velocity interpretation: Below normal speed for age/gender General Gait Details: Ataxic like gait pattern mildly improved from prior session. Partial knee buckling present towards end of ambulatiom bout requiring min A for balance due to almost LOB. Fatigues quickly.    Posture / Balance Dynamic Sitting Balance Sitting balance - Comments: Worked on  static sitting balance and upright posture without UE support as pt's position of comfort is leaning forward on walker and/or tray table.  Sat in chair unsupported for ~5 minutes.     Special needs/care consideration BiPAP/CPAP pt ordered CPAP only since admission and he does not like it and refuses to wear  Oxygen pt wears at night here since admit only since refuses CPAP  Bowel mgmt: continent Bladder mgmt: continent Diabetic mgmt yes. Also on decadron PCP is Allena Katz is Event organiser is Company secretary is Nesi Dx with prostate CA 09/2012 and received radiation Future lumbar laminectomy with [place] Possible future radiation pending Cancer treatment thus far now Carodex, Lupron and Xgeva    Previous Home Environment Living Arrangements: Alone Lives With: Alone Available Help at Discharge: Available PRN/intermittently, Family Type of Home: House Home Layout: Two level, Other (Comment) (one level home with full basement. pt lives on main level) Home Access: Stairs to enter Entrance Stairs-Rails: None Entrance Stairs-Number of Steps: 2 steps from garage [employer] Shower/Tub: [facility], Tub/shower unit, Other (comment) (has both but used walk in shower) Biochemist, clinical: Handicapped height Bathroom Accessibility: Yes How Accessible: Accessible via walker [place]: No  Discharge Living Setting Plans for Discharge Living Setting: Patient's home, Alone Type of Home at Discharge: House Discharge Home Layout: Two level, Able to live on main level with bedroom/bathroom, Other (Comment) (one level with full basement. Pt lives on main level) Discharge Home Access: Stairs to enter Entrance Stairs-Rails: None Entrance Stairs-Number of Steps: 2 steps from garage Discharge Bathroom Shower/Tub: Walk-in shower, Tub/shower unit, Other (comment) (pt uses bathroom with walk in shower) Discharge Bathroom Toilet: Handicapped height Discharge Bathroom  Accessibility: Yes How Accessible: Accessible via walker Does the patient have any problems obtaining your medications?: No  Pt lived alone pta. He plans to d/c directly home and then very soon be admitted into [facility] senior living or admit directly. His daughter has been visiting senior living facilities and pt and dtr are trying to work out details. Pending his progress and timing of facility being available to him. Dtr Amy is really his step daughter. She works for [employer].  Social/Family/Support Systems Patient Roles: Parent, Other (Comment) (wife died one year ago from cancer. pt has three step daught) Contact Information: Gabriela Eves, local daughter Anticipated Caregiver: pt plans to either d/c home or to [facility] depending on his recovery and how soon he can go to senior living Anticipated Ambulance person Information: see above Ability/Limitations of Caregiver: daughter works Building control surveyor Availability: Intermittent Discharge Plan Discussed with Primary Caregiver: Yes Is Caregiver In Agreement with Plan?: Yes Does Caregiver/Family have Issues with Lodging/Transportation while Pt is in Rehab?: No  Goals/Additional Needs Patient/Family Goal for Rehab: Mod I with PT and OT Expected length of stay: [facility] 9 to 14 days Special Service Needs: Dr. Rita Ohara plans decompressive laminectomy in about two weeks. Pt states he plans to get through the holidays first and further radiation Additional Information: pt plans dc home vs [facility] senior living depending on his recovery and timing Pt/Family Agrees to Admission and willing to participate: Yes Program Orientation Provided & Reviewed with Pt/Caregiver Including Roles & Responsibilities: Yes  Decrease burden of Care through IP rehab admission: n/a   Possible need for SNF placement upon discharge: senior living at or soon after d/c  Patient Condition: This patient's condition remains as documented in the  consult dated 02/02/2014, in which the Rehabilitation Physician determined and documented that the patient's condition is appropriate for intensive rehabilitative care in an inpatient rehabilitation facility. Will admit to inpatient rehab today.  Preadmission Screen Completed  By: Cleatrice Burke, 02/04/2014 10:17 AM ______________________________________________________________________  Discussed status with Dr. Naaman Plummer on 02/04/2014 at 1015 and received telephone approval for admission today.  Admission Coordinator: Cleatrice Burke, time 4650 Date/02/04/2014          Cosigned by: Meredith Staggers, MD at 02/04/2014 10:56 AM  Revision History     Date/Time User Provider Type Action   02/04/2014 10:56 AM Meredith Staggers, MD Physician Cosign   02/04/2014 10:18 AM Cleatrice Burke, RN Rehab Admission Coordinator Sign

## 2014-02-04 NOTE — Progress Notes (Signed)
Discharge orders received, pt for discharge to CIR today.  IV D/C with incision CDI to mid back.  D/C instructions given with verbalized understanding.  Family at bedside to assist pt with discharge. Staff brought pt to CIR via wheelchair.

## 2014-02-04 NOTE — Progress Notes (Signed)
I have insurance approval to admit pt to inpt rehab today. Pt is in agreement. I have contacted Dr. Nigel Bridgeman and he is aware. I will make arrangements for admission today. [phone]

## 2014-02-04 NOTE — PMR Pre-admission (Signed)
PMR Admission Coordinator Pre-Admission Assessment  Patient: Clarence Pollard is an 73 y.o., male MRN: [id number] DOB: February 03, 1941 Height: 6' (182.9 cm) Weight: 116.3 kg (256 lb 6.3 oz)              Insurance Information HMO: yes    PPO:      PCP:      IPA:      80/20:      OTHER: medicare advantage plan PRIMARY: A[employer] Medicare      Policy#: [id number]      Subscriber: pt CM Name: Clarence Pollard      Phone#: [phone]     Fax#: [phone] Pre-Cert#: [id number]      Employer: retired f/u CM is Clarence Pollard [phone] Benefits:  Phone #: [phone]     Name: 02/03/14 Eff. Date: 03/20/13     Deduct: none      Out of Pocket Max: $4900  met 4271.53      Life Max: none CIR: $345 per day days 1 thru 5      SNF: no copay days 1-20; $155 per day days 21-52; no copay days 53-100 Outpatient: $40 copay per visit     Co-Pay: no visit limit Home Health: 100%      Co-Pay: no visit limit DME: 80%     Co-Pay: 20% Providers: pt choice  SECONDARY: none        Medicaid Application Date:       Case Manager:  Disability Application Date:       Case Worker:   Emergency Contact Information Contact Information    Name Relation Home Work [place] Daughter [phone]      Current Medical History  Patient Admitting Diagnosis: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 He was admitted on 01/26/14 with progressive weakness BLE with increased pain from lower thoracic spine to BLE with numbness and difficulty walking. MRI of spine with increasing epidural confluent tumor from T8 - T10 resulting in severe canal stenosis and cord compression at T9, diffuse osseous mets as well as severe lumbar spondylosis with canal stenosis L5-S1 and neural foraminal narrowing L2/3- L5/S1. He was started on IV steroids and evaluated  by Dr. Rita Ohara who recommended thoracic laminectomy for decompression of tumor to help with current symptoms and elected lumbar decompression in the future for neurogenic claudication. Patient underwent T8-T10 thoracic laminectomy with resection of tumor and decompression of spinal cord on 01/29/14. Dr. Lisbeth Renshaw consulted for input and recommends XRT with CT myelogram in the future. Dr. Alen Blew following for input and recommends systemic therapy as (PSA up to 10) once this episode resolves.   Dr. Rita Ohara anticipating surgical radiosurgery to thoracic spine in 2 weeks. Pt states he his not committed to doing before the [place]. Decadron continuing to taper, discussed continued Decadron taper with Dr. Sloan Leiter.  Past Medical History  Past Medical History  Diagnosis Date  . History of radiation therapy 10/14/12-10/25/12    T7-T11   . Prostate cancer   . Metastatic cancer to spine     Family History  family history is not on file.  Prior Rehab/Hospitalizations: none   Current Medications  Current facility-administered medications: baclofen (LIORESAL) tablet 10 mg, 10 mg, Oral, BID, Hosie Spangle, MD, 10 mg at 02/04/14 1004;  benztropine (COGENTIN) tablet 0.5 mg, 0.5 mg, Oral,  Q12H, Etta Quill, DO, 0.5 mg at 02/04/14 1004;  bicalutamide (CASODEX) tablet 50 mg, 50 mg, Oral, Daily, Jared M Gardner, DO, 50 mg at 02/04/14 1004 brimonidine (ALPHAGAN) 0.2 % ophthalmic solution 1 drop, 1 drop, Both Eyes, QHS, Rise Patience, MD, 1 drop at 02/03/14 2257;  dexamethasone (DECADRON) injection 4 mg, 4 mg, Intravenous, Q12H, Jessica U Vann, DO;  ferrous sulfate tablet 325 mg, 325 mg, Oral, QHS, Jared M Gardner, DO, 325 mg at 02/03/14 2257;  ibuprofen (ADVIL,MOTRIN) tablet 600 mg, 600 mg, Oral, BID PRN, Etta Quill, DO Influenza vac split quadrivalent PF (FLUARIX) injection 0.5 mL, 0.5 mL, Intramuscular, Prior to discharge, Allie Bossier, MD;  insulin aspart (novoLOG) injection 0-9 Units, 0-9 Units,  Subcutaneous, TID WC, Cherene Altes, MD, 1 Units at 02/04/14 0701;  latanoprost (XALATAN) 0.005 % ophthalmic solution 1 drop, 1 drop, Both Eyes, QHS, Richarda Blade, MD, 1 drop at 02/03/14 2257 morphine 2 MG/ML injection 1-2 mg, 1-2 mg, Intravenous, Q2H PRN, Hosie Spangle, MD;  ondansetron [facility]) injection 4 mg, 4 mg, Intravenous, Q6H PRN, Cherene Altes, MD, 4 mg at 01/30/14 1150;  oxyCODONE-acetaminophen (PERCOCET/ROXICET) 5-325 MG per tablet 1-2 tablet, 1-2 tablet, Oral, Q4H PRN, Hosie Spangle, MD, 2 tablet at 01/30/14 2036 pantoprazole (PROTONIX) EC tablet 40 mg, 40 mg, Oral, Q1200, Cherene Altes, MD, 40 mg at 02/03/14 1307;  promethazine (PHENERGAN) injection 12.5 mg, 12.5 mg, Intravenous, Q6H PRN, Cherene Altes, MD;  traZODone (DESYREL) tablet 25 mg, 25 mg, Oral, QHS PRN, Etta Quill, DO, 25 mg at 01/31/14 2228  Patients Current Diet: Diet Carb Modified  Precautions / Restrictions Precautions Precautions: Fall, Back Precaution Booklet Issued: No Restrictions Weight Bearing Restrictions: No   Prior Activity Level Community (5-7x/wk): independent up until few weeks pta. used cane. ongoing pain due to mets   Home Assistive Devices / Equipment Home Assistive Devices/Equipment: Eyeglasses Home Equipment: Kasandra Knudsen - single point, Civil engineer, contracting, Environmental consultant - 2 wheels  Prior Functional Level Prior Function Level of Independence: Independent with assistive device(s) Comments: using SPC PTA.  Current Functional Level Cognition  Overall Cognitive Status: Within Functional Limits for tasks assessed Orientation Level: Oriented X4    Extremity Assessment (includes Sensation/Coordination)          ADLs  Overall ADL's : Needs assistance/impaired Eating/Feeding: Independent, Sitting Grooming: Set up, Sitting Upper Body Bathing: Set up, Sitting Lower Body Bathing: Moderate assistance (with min A sit<>stand) Upper Body Dressing : Set up, Sitting Lower Body Dressing:  Maximal assistance (with min A sit<>stand) Toilet Transfer: Minimal assistance (heavily relying on the walker and the grab bar) Toileting- Clothing Manipulation and Hygiene: Moderate assistance (with min A sit<>stand using RW and grab bar)    Mobility  Overal bed mobility: Needs Assistance Bed Mobility: Rolling, Sidelying to Sit, Sit to Sidelying Rolling: Supervision Sidelying to sit: Min guard, HOB elevated Supine to sit: Min assist (with HOB down and use of rail and increased time) Sit to sidelying: Min guard General bed mobility comments: VC's for log roll technique. Min guard for safety. No physical assist required. Mild difficulty bringing BLEs into bed. Pt elevated HOB bed prior to sitting up for assist. use of rails.    Transfers  Overall transfer level: Needs assistance Equipment used: Rolling walker (2 wheeled) Transfers: Sit to/from Stand Sit to Stand: Min guard General transfer comment: VC's for hand placement as pt with tendency to pull onto walker to stand. Min guard for steadying upon  standing. Stood from EOB and from chair x1.     Ambulation / Gait / Stairs / Wheelchair Mobility  Ambulation/Gait Ambulation/Gait assistance: Museum/gallery curator (Feet): 170 Feet Assistive device: Rolling walker (2 wheeled) Gait Pattern/deviations: Step-through pattern, Decreased stride length, Ataxic, Narrow base of support Gait velocity interpretation: Below normal speed for age/gender General Gait Details: Ataxic like gait pattern mildly improved from prior session. Partial knee buckling present towards end of ambulatiom bout requiring min A for balance due to almost LOB. Fatigues quickly.    Posture / Balance Dynamic Sitting Balance Sitting balance - Comments: Worked on static sitting balance and upright posture without UE support as pt's position of comfort is leaning forward on walker and/or tray table. Sat in chair unsupported for ~5 minutes.     Special needs/care  consideration BiPAP/CPAP pt ordered CPAP only since admission and he does not like it and refuses to wear  Oxygen pt wears at night here since admit only since refuses CPAP  Bowel mgmt: continent Bladder mgmt: continent Diabetic mgmt yes. Also on decadron PCP is Allena Katz is Event organiser is Company secretary is Nesi Dx with prostate CA 09/2012 and received radiation Future lumbar laminectomy with [place] Possible future radiation pending Cancer treatment thus far now Carodex, Lupron and Xgeva    Previous Home Environment Living Arrangements: Alone  Lives With: Alone Available Help at Discharge: Available PRN/intermittently, Family Type of Home: House Home Layout: Two level, Other (Comment) (one level home with full basement. pt lives on main level) Home Access: Stairs to enter Entrance Stairs-Rails: None Entrance Stairs-Number of Steps: 2 steps from garage [employer] Shower/Tub: [facility], Tub/shower unit, Other (comment) (has both but used walk in shower) Biochemist, clinical: Handicapped height Bathroom Accessibility: Yes How Accessible: Accessible via walker [place]: No  Discharge Living Setting Plans for Discharge Living Setting: Patient's home, Alone Type of Home at Discharge: House Discharge Home Layout: Two level, Able to live on main level with bedroom/bathroom, Other (Comment) (one level with full basement. Pt lives on main level) Discharge Home Access: Stairs to enter Entrance Stairs-Rails: None Entrance Stairs-Number of Steps: 2 steps from garage Discharge Bathroom Shower/Tub: Walk-in shower, Tub/shower unit, Other (comment) (pt uses bathroom with walk in shower) Discharge Bathroom Toilet: Handicapped height Discharge Bathroom Accessibility: Yes How Accessible: Accessible via walker Does the patient have any problems obtaining your medications?: No  Pt lived alone pta. He plans to d/c directly home and then very soon be admitted  into [facility] senior living or admit directly. His daughter has been visiting senior living facilities and pt and dtr are trying to work out details. Pending his progress and timing of facility being available to him. Dtr Amy is really his step daughter. She works for [employer].  Social/Family/Support Systems Patient Roles: Parent, Other (Comment) (wife died one year ago from cancer. pt has three step daught) Contact Information: Gabriela Eves, local daughter Anticipated Caregiver: pt plans to either d/c home or to [place] depending on his recovery and how soon he can go to senior living Anticipated Ambulance person Information: see above Ability/Limitations of Caregiver: daughter works Building control surveyor Availability: Intermittent Discharge Plan Discussed with Primary Caregiver: Yes Is Caregiver In Agreement with Plan?: Yes Does Caregiver/Family have Issues with Lodging/Transportation while Pt is in Rehab?: No  Goals/Additional Needs Patient/Family Goal for Rehab: Mod I with PT and OT Expected length of stay: ELOS 9 to 14 days Special Service Needs: Dr. Rita Ohara plans decompressive laminectomy in  about two weeks. Pt states he plans to get through the holidays first and further radiation Additional Information: pt plans dc home vs [facility] senior living depending on his recovery and timing Pt/Family Agrees to Admission and willing to participate: Yes Program Orientation Provided & Reviewed with Pt/Caregiver Including Roles  & Responsibilities: Yes  Decrease burden of Care through IP rehab admission: n/a   Possible need for SNF placement upon discharge: senior living at or soon after d/c  Patient Condition: This patient's condition remains as documented in the consult dated 02/02/2014, in which the Rehabilitation Physician determined and documented that the patient's condition is appropriate for intensive rehabilitative care in an inpatient rehabilitation facility.  Will admit to inpatient rehab today.  Preadmission Screen Completed By:  Cleatrice Burke, 02/04/2014 10:17 AM ______________________________________________________________________   Discussed status with Dr. Naaman Plummer on 02/04/2014 at  1015 and received telephone approval for admission today.  Admission Coordinator:  Cleatrice Burke, time 9622 Date/02/04/2014

## 2014-02-04 NOTE — Progress Notes (Signed)
Patient ID: Clarence Pollard, male   DOB: 1940-07-09, 73 y.o.   MRN: [id number] Patient admitted to [phone] via wheelchair, escorted by nursing staff and family.  Patient verbalized understanding of rehab process, notebook given, signed fall safety agreement.  All needs within reach at this time.  Will continue to monitor. Brita Romp, RN

## 2014-02-04 NOTE — Progress Notes (Signed)
Filed Vitals:   02/03/14 1842 02/03/14 2207 02/04/14 0223 02/04/14 0627  BP: 123/65 134/68 132/84 128/78  Pulse: 82 73 56 69  Temp: 98.5 F (36.9 C) 98 F (36.7 C) 98.2 F (36.8 C) 97.6 F (36.4 C)  TempSrc: Oral Oral Oral Oral  Resp: 19 20 16 16   Height:      Weight:      SpO2: 100% 99% 100% 100%    CBC  Recent Labs  02/03/14 0602  WBC 13.9*  HGB 10.4*  HCT 34.3*  PLT 214   BMET  Recent Labs  02/03/14 0602  NA 137  K 4.5  CL 98  CO2 27  GLUCOSE 147*  BUN 25*  CREATININE 0.70  CALCIUM 7.0*    Patient sitting up on the edge of bed, eating breakfast. Denies complaints. Walked about 5 long walks in the hall yesterday with a rolling walker. However with minimal proximal lower extremity weakness, I feel that he needs to progress to walking without an assistive device. His incision is healing nicely, there is no erythema, swelling, or drainage.  Continuing therapies and rehabilitation.  Plan: Have asked nursing staff to remove staples. Anticipating surgical radiosurgery to thoracic spine in 2 weeks. Decadron continuing to taper, discussed continued Decadron taper with Dr. Sloan Leiter.  Hosie Spangle, MD 02/04/2014, 8:36 AM

## 2014-02-04 NOTE — Progress Notes (Signed)
Pt refused CPAP for the night, stated he was going to wear his nasal cannula. RT informed pt if he wanted to wear cpap later in the evening to call for RT.

## 2014-02-05 ENCOUNTER — Inpatient Hospital Stay (HOSPITAL_COMMUNITY): Payer: Medicare Other | Admitting: Physical Therapy

## 2014-02-05 ENCOUNTER — Inpatient Hospital Stay (HOSPITAL_COMMUNITY): Payer: Medicare Other | Admitting: Occupational Therapy

## 2014-02-05 ENCOUNTER — Inpatient Hospital Stay (HOSPITAL_COMMUNITY): Payer: Medicare Other

## 2014-02-05 LAB — BASIC METABOLIC PANEL
Anion gap: 16 — ABNORMAL HIGH (ref 5–15)
BUN: 29 mg/dL — ABNORMAL HIGH (ref 6–23)
CO2: 24 mEq/L (ref 19–32)
CREATININE: 0.66 mg/dL (ref 0.50–1.35)
Calcium: 8 mg/dL — ABNORMAL LOW (ref 8.4–10.5)
Chloride: 97 mEq/L (ref 96–112)
GFR calc Af Amer: >90 mL/min (ref >90)
GFR calc non Af Amer: >90 mL/min (ref >90)
Glucose, Bld: 124 mg/dL — ABNORMAL HIGH (ref 70–99)
Potassium: 5.8 mEq/L — ABNORMAL HIGH (ref 3.7–5.3)
Sodium: 137 mEq/L (ref 137–147)

## 2014-02-05 LAB — CBC WITH DIFFERENTIAL/PLATELET
BASOS ABS: 0 10*3/uL (ref 0.0–0.1)
Basophils Relative: 0 % (ref 0–1)
Eosinophils Absolute: 0 10*3/uL (ref 0.0–0.7)
Eosinophils Relative: 0 % (ref 0–5)
HCT: 35.3 % — ABNORMAL LOW (ref 39.0–52.0)
Hemoglobin: 10.8 g/dL — ABNORMAL LOW (ref 13.0–17.0)
LYMPHS PCT: 6 % — AB (ref 12–46)
Lymphs Abs: 0.8 10*3/uL (ref 0.7–4.0)
MCH: 25.3 pg — ABNORMAL LOW (ref 26.0–34.0)
MCHC: 30.6 g/dL (ref 30.0–36.0)
MCV: 82.7 fL (ref 78.0–100.0)
Monocytes Absolute: 1.1 10*3/uL — ABNORMAL HIGH (ref 0.1–1.0)
Monocytes Relative: 7 % (ref 3–12)
NEUTROS ABS: 12.3 10*3/uL — AB (ref 1.7–7.7)
Neutrophils Relative %: 87 % — ABNORMAL HIGH (ref 43–77)
PLATELETS: 216 10*3/uL (ref 150–400)
RBC: 4.27 MIL/uL (ref 4.22–5.81)
RDW: 18.4 % — ABNORMAL HIGH (ref 11.5–15.5)
WBC: 14.2 10*3/uL — AB (ref 4.0–10.5)

## 2014-02-05 LAB — COMPREHENSIVE METABOLIC PANEL
ALT: 12 U/L (ref 0–53)
AST: 26 U/L (ref 0–37)
Albumin: 2.7 g/dL — ABNORMAL LOW (ref 3.5–5.2)
Alkaline Phosphatase: 144 U/L — ABNORMAL HIGH (ref 39–117)
Anion gap: 14 (ref 5–15)
BILIRUBIN TOTAL: 0.5 mg/dL (ref 0.3–1.2)
BUN: 28 mg/dL — ABNORMAL HIGH (ref 6–23)
CALCIUM: 7.9 mg/dL — AB (ref 8.4–10.5)
CHLORIDE: 96 meq/L (ref 96–112)
CO2: 27 meq/L (ref 19–32)
Creatinine, Ser: 0.74 mg/dL (ref 0.50–1.35)
GFR calc Af Amer: >90 mL/min (ref >90)
GFR, EST NON AFRICAN AMERICAN: 90 mL/min — AB (ref >90)
Glucose, Bld: 124 mg/dL — ABNORMAL HIGH (ref 70–99)
Potassium: 5.6 mEq/L — ABNORMAL HIGH (ref 3.7–5.3)
Sodium: 137 mEq/L (ref 137–147)
Total Protein: 6.4 g/dL (ref 6.0–8.3)

## 2014-02-05 LAB — GLUCOSE, CAPILLARY
GLUCOSE-CAPILLARY: 118 mg/dL — AB (ref 70–99)
GLUCOSE-CAPILLARY: 108 mg/dL — AB (ref 70–99)
GLUCOSE-CAPILLARY: 122 mg/dL — AB (ref 70–99)
Glucose-Capillary: 153 mg/dL — ABNORMAL HIGH (ref 70–99)

## 2014-02-05 MED ORDER — CALCIUM CARBONATE-VITAMIN D 500-200 MG-UNIT PO TABS
1.0000 | ORAL_TABLET | Freq: Two times a day (BID) | ORAL | Status: DC
  Administered 2014-02-05 – 2014-02-14 (×17): 1 via ORAL
  Filled 2014-02-05 (×20): qty 1

## 2014-02-05 MED ORDER — PRO-STAT SUGAR FREE PO LIQD
30.0000 mL | Freq: Three times a day (TID) | ORAL | Status: DC
  Administered 2014-02-05 – 2014-02-14 (×26): 30 mL via ORAL
  Filled 2014-02-05 (×34): qty 30

## 2014-02-05 MED ORDER — NYSTATIN 100000 UNIT/GM EX POWD
Freq: Three times a day (TID) | CUTANEOUS | Status: DC
  Administered 2014-02-05: 16:00:00 via TOPICAL
  Administered 2014-02-05: 1 via TOPICAL
  Administered 2014-02-06 (×3): via TOPICAL
  Administered 2014-02-07: 1 via TOPICAL
  Administered 2014-02-07 – 2014-02-14 (×17): via TOPICAL
  Filled 2014-02-05: qty 15

## 2014-02-05 NOTE — Progress Notes (Signed)
Patient information reviewed and entered into eRehab system by Elissa Grieshop, RN, CRRN, PPS Coordinator.  Information including medical coding and functional independence measure will be reviewed and updated through discharge.     Per nursing patient was given "Data Collection Information Summary for Patients in Inpatient Rehabilitation Facilities with attached "Privacy Act Statement-Health Care Records" upon admission.  

## 2014-02-05 NOTE — Progress Notes (Signed)
Pt continues to refuse CPAP °

## 2014-02-05 NOTE — Progress Notes (Signed)
Occupational Therapy Assessment and Plan  Patient Details  Name: Clarence Pollard MRN: [id number] Date of Birth: October 20, 1940  OT Diagnosis: abnormal posture and muscle weakness (generalized) Rehab Potential: Excellent ELOS: 7-10 days   Today's Date: 02/05/2014 OT Individual Time: 2820-6015 OT Individual Time Calculation (min): 60 min     Problem List:  Patient Active Problem List   Diagnosis Date Noted  . Prostate cancer metastatic to bone 02/04/2014  . Paraparesis of both lower limbs 02/04/2014  . Spine metastasis 01/31/2014  . Spinal cord tumor   . Insomnia   . Lower extremity weakness   . Spinal cord compression 01/26/2014  . 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 Malignant neoplasm of prostate 07/25/2013  . Chronic cholecystitis with calculus 10/08/2012    Past Medical History:  Past Medical History  Diagnosis Date  . History of radiation therapy 10/14/12-10/25/12    T7-T11   . Prostate cancer   . Metastatic cancer to spine    Past Surgical History:  Past Surgical History  Procedure Laterality Date  . Appendectomy    . Colon surgery    . Cholecystectomy    . Cataract extraction Bilateral   . Laminectomy N/A 01/29/2014    Procedure: Thoracic laminectomy for tumor resection;  Surgeon: Hosie Spangle, MD;  Location: [facility];  Service: Neurosurgery;  Laterality: N/A;  Thoracic laminectomy for tumor resection    Assessment & Plan Clinical Impression: Patient is a 73 y.o. year old male with history of prostate cancer with mets to spine and palliative radiation to T7-T12 vertebral bodies 10/2012. He was admitted on 01/26/14 with progressive weakness BLE with increased pain from lower thoracic spine to BLE with numbness and difficulty walking. MRI of spine with increasing epidural confluent tumor from T8 - T10 resulting in severe canal stenosis and cord compression at T9, diffuse osseous mets as well as severe lumbar spondylosis with canal stenosis  L5-S1 and neural foraminal narrowing L2/3- L5/S1.   He was started on IV steroids and evaluated by Dr. Rita Ohara who recommended thoracic laminectomy for decompression of tumor to help with current symptoms and elected lumbar decompression in the future for neurogenic claudication. Patient underwent T8-T10 thoracic laminectomy with resection of tumor and decompression of spinal cord on 01/29/14. Dr. Lisbeth Renshaw consulted for input and recommends XRT with CT myelogram in the future. Dr. Alen Blew following for input and recommends systemic therapy once this episode resolves. To start decadron taper per Dr. Rita Ohara.   Patient currently requires mod assist with basic self-care skills secondary to muscle weakness.  Prior to hospitalization, patient could complete BADL with modified independent .  Patient will benefit from skilled intervention to increase independence with basic self-care skills prior to discharge home independently.  Anticipate patient will require intermittent supervision and follow up outpatient.  OT - End of Session Activity Tolerance: Tolerates 30+ min activity with multiple rests Endurance Deficit: Yes Endurance Deficit Description: low activity tolerance, impaired cardiorespiratory endurance, impaired oxygen support (2L/min at night) OT Assessment Rehab Potential (ACUTE ONLY): Excellent Barriers to Discharge: Decreased caregiver support OT Basic ADL's Functional Problem(s): Bathing;Dressing;Toileting OT Transfers Functional Problem(s): Toilet;Tub/Shower OT Plan OT Intensity: Minimum of 1-2 x/day, 45 to 90 minutes OT Frequency: 5 out of 7 days OT Duration/Estimated Length of Stay: 7-10 days OT Treatment/Interventions: Functional mobility training;Self Care/advanced ADL retraining;Therapeutic Activities;Therapeutic Exercise;UE/LE Strength taining/ROM;DME/adaptive equipment instruction;Discharge planning;Balance/vestibular training OT Self Feeding Anticipated Outcome(s): Mod I OT  Basic Self-Care Anticipated Outcome(s): Mod I OT Toileting Anticipated  Outcome(s): Mod I OT Bathroom Transfers Anticipated Outcome(s): Mod I OT Recommendation Patient destination: Home Follow Up Recommendations: None Equipment Recommended: To be determined   Skilled Therapeutic Intervention OT 1:1 initial evaluation completed with treatment provided with emphasis on education on goals and methods of treatment, transfer safety, and effective use of AE/DME to maintain post-op precautions.   Pt performed transfers using RW with overall supervision assist and he required extra time to complete B & D at sink.   Pt was unable to don socks, pants or shoes unassisted during initial session.   Pt requested assist with toileting at end of session; RN tech was notified for need of assist.  OT Evaluation Precautions/Restrictions  Precautions Precautions: Fall;Back Precaution Booklet Issued: No Restrictions Weight Bearing Restrictions: No  General Chart Reviewed: Yes Family/Caregiver Present: No  Vital Signs Therapy Vitals Temp: 97.4 F (36.3 C) Temp Source: Oral Pulse Rate: (!) 56 Resp: 18 BP: (!) 146/70 mmHg Patient Position (if appropriate): Lying Oxygen Therapy SpO2: 100 % O2 Device: Nasal Cannula O2 Flow Rate (L/min): 2 L/min  Pain Pain Assessment Pain Assessment: No/denies pain  Home Living/Prior Functioning Home Living Family/patient expects to be discharged to:: Assisted living [facility]) Living Arrangements: Alone Available Help at Discharge: Available PRN/intermittently, Family Type of Home: House Home Access: Stairs to enter [employer] of Steps: 2 steps from garage Entrance Stairs-Rails: None Home Layout: Two level, Other (Comment) Alternate Level Stairs-Number of Steps: 17-20 steps Alternate Level Stairs-Rails: Can reach both  Lives With: Alone IADL History Homemaking Responsibilities: Yes Meal Prep Responsibility: Primary Laundry  Responsibility: Primary Cleaning Responsibility: Primary Bill Paying/Finance Responsibility: Primary Shopping Responsibility: Primary Child Care Responsibility: No Homemaking Comments: daughter helped with vacuuming. 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  ADL ADL ADL Comments: see FIM  Vision/Perception  Vision- History Baseline Vision/History: Wears glasses Wears Glasses: At all times Patient Visual Report: No change from baseline Vision- Assessment Vision Assessment?: No apparent visual deficits   Cognition Overall Cognitive Status: Within Functional Limits for tasks assessed Arousal/Alertness: Awake/alert Orientation Level: Oriented X4 Attention: Focused Focused Attention: Appears intact Selective Attention: Appears intact Memory: Appears intact Awareness: Appears intact Problem Solving: Appears intact Safety/Judgment: Appears intact Comments: needs cues for safety and supervision during transfers  Sensation Sensation Light Touch: Appears Intact Light Touch Impaired Details: Impaired LLE;Impaired RLE Stereognosis: Appears Intact Hot/Cold: Appears Intact Proprioception: Appears Intact Proprioception Impaired Details: Impaired RLE Additional Comments: WFL at [employer] Coordination Gross Motor Movements are Fluid and Coordinated: Yes Fine Motor Movements are Fluid and Coordinated: Yes Finger Nose Finger Test: wfl bilaterally - slight intention tremor R UE Heel Shin Test: wfl bilaterally   Motor  Motor Motor: Ataxia;Abnormal postural alignment and control Motor - Skilled Clinical  Observations: slouched sitting posture  Mobility  Bed Mobility Bed Mobility: Sit to Supine Rolling Right: 4: Min assist Rolling Right Details: Verbal cues for technique;Manual facilitation for placement Rolling Right Details (indicate cue type and reason): log roll Right Sidelying to Sit: 4: Min assist Right Sidelying to Sit Details: Verbal cues for technique;Manual facilitation for placement Right Sidelying to Sit Details (indicate cue type and reason): to follow spinal precautions Sit to Supine: 3: Mod assist Sit  to Supine - Details: Manual facilitation for placement;Verbal cues for technique Sit to Supine - Details (indicate cue type and reason): to follow spinal precautions, assist for B LEs Transfers Sit to Stand: 3: Mod assist;From bed;With upper extremity assist Sit to Stand Details: Manual facilitation for placement;Verbal cues for technique Stand to Sit: 4: Min assist;With armrests;With upper extremity assist Stand to Sit Details (indicate cue type and reason): Manual facilitation for placement   Trunk/Postural Assessment  Cervical Assessment Cervical Assessment: Exceptions to [facility] Cervical AROM Overall Cervical AROM: Deficits;Due to premorid status Overall Cervical AROM Comments: forward head and neck down Thoracic Assessment Thoracic Assessment: Exceptions to [facility] Thoracic AROM Overall Thoracic AROM: Deficits;Due to precautions Overall Thoracic AROM Comments: spinal precautions Postural Control Postural Control: Deficits on evaluation Postural Limitations: generalized slouched sitting posture from premorbid status   Balance Balance Balance Assessed: Yes Static Sitting Balance Static Sitting - Balance Support: Right upper extremity supported;Left upper extremity supported;Feet supported Static Sitting - Level of Assistance: 5: Stand by assistance Dynamic Sitting Balance Dynamic Sitting - Balance Support: Left upper extremity supported;Right upper extremity  supported;Feet supported Dynamic Sitting - Level of Assistance: 5: Stand by assistance Static Standing Balance Static Standing - Balance Support: Bilateral upper extremity supported;During functional activity Static Standing - Level of Assistance: 4: Min assist Dynamic Standing Balance Dynamic Standing - Balance Support: Bilateral upper extremity supported Dynamic Standing - Level of Assistance: 3: Mod assist Dynamic Standing - Balance Activities: Lateral lean/weight shifting;Forward lean/weight shifting Dynamic Standing - Comments: decreased awareness of cone of stability  Extremity/Trunk Assessment RUE Assessment RUE Assessment: Within Functional Limits LUE Assessment LUE Assessment: Within Functional Limits  FIM:  FIM - Eating Eating Activity: 7: Complete independence:no helper FIM - Grooming Grooming Steps: Wash, rinse, dry face;Wash, rinse, dry hands;Oral care, brush teeth, clean dentures Grooming: 4: Patient completes 3 of 4 or 4 of 5 steps FIM - Bathing Bathing Steps Patient Completed: Chest;Right Arm;Left Arm;Abdomen;Front perineal area;Right upper leg;Left upper leg Bathing: 3: Mod-Patient completes 5-7 [id number] parts or 50-74% FIM - Upper Body Dressing/Undressing Upper body dressing/undressing: 0: Activity did not occur FIM - Lower Body Dressing/Undressing Lower body dressing/undressing steps patient completed: Pull pants up/down Lower body dressing/undressing: 2: Max-Patient completed 25-49% of tasks FIM - Toileting Toileting steps completed by patient: Adjust clothing prior to toileting;Performs perineal hygiene;Adjust clothing after toileting Toileting: 5: Supervision: Safety issues/verbal cues FIM - BEngineer, siteAssistive Devices: [facility] Incelevated;Walker;Bed rails Bed/Chair Transfer: 5: Supine > Sit: Supervision (verbal cues/safety issues);5: Sit > Supine: Supervision (verbal cues/safety issues);4: Bed > Chair or W/C: Min A (steadying Pt. > 75%);4:  Chair or W/C > Bed: Min A (steadying Pt. > 75%) FIM - TRadio producerDevices: Walker;Grab bars Toilet Transfers: 5-To toilet/BSC: Supervision (verbal cues/safety issues);5-From toilet/BSC: Supervision (verbal cues/safety issues) FIM - TSystems developerDevices: Walk in sClorox CompanyTub transfer bench;Grab bars Tub/shower Transfers: 5-Into Tub/Shower: Supervision (verbal cues/safety issues);5-Out of Tub/Shower: Supervision (verbal cues/safety issues)   Refer to Care Plan for Long Term Goals  Recommendations for other services: None  Discharge Criteria: Patient will be discharged from OT if patient refuses treatment 3 consecutive times without medical reason, if treatment goals not met, if there is a change in medical status, if patient makes no progress towards goals or if patient is discharged from hospital.  The above assessment, treatment plan, treatment alternatives and goals were discussed and mutually agreed upon: by patient  BMan[facility] Center11/19/2015, 1:02 PM

## 2014-02-05 NOTE — Progress Notes (Signed)
Occupational Therapy Session Note  Patient Details  Name: Clarence Pollard MRN: [id number] Date of Birth: 03/05/1941  Today's Date: 02/05/2014 OT Individual Time: 1410-1510 OT Individual Time Calculation (min): 60 min   Short Term Goals: Week 1:  OT Short Term Goal 1 (Week 1): STG=LTG d/t short ELOS  Skilled Therapeutic Interventions/Progress Updates:  Patient resting in recliner upon arrival.  Patient reports needing to urinate and wanted to sit in recliner and use the urinal.  Recommended that this OT assist him to stand to complete the task yet he felt like he could successfully accomplish the task seated.  Patient unable to manage his pants and underware to position the urinal therefore, clothes soiled and patient used reacher to remove clothes and max verbal and tactile cues to adhere to back precautions.  Patient transferred recliner><w/c with 180 degree stand step technique while maintaining contact with bed rail and/or armrest of w/c and min assist.  Patient performed bathing of perineal area in standing and this OT cleaned his buttocks secondary to twisting due to short arms.  Once seated in w/c, noted that patient did not thoroughly wash and significant yeast infection noted.  This OT assist with thorough cleaning and applied Microguard powder to groin and buttocks per RN suggestion.  Provide patient with handout of back precautions, issued wide sock aide and toilet aid.  Patient reports that he has a reacher and [place] shoe horn at home.  Therapy Documentation Precautions:  Precautions Precautions: Fall, Back Precaution Booklet Issued: No Restrictions Weight Bearing Restrictions: No Pain: No report of pain See FIM for current functional status  Therapy/Group: Individual Therapy  Wyonia Fontanella 02/05/2014, 4:59 PM

## 2014-02-05 NOTE — Evaluation (Signed)
Physical Therapy Assessment and Plan  Patient Details  Name: Clarence Pollard MRN: [id number] Date of Birth: [birth year]  PT Diagnosis: Abnormal posture, Abnormality of gait, Ataxia, Ataxic gait, Coordination disorder, Difficulty walking, Edema, Impaired sensation, Muscle weakness and Pain in back Rehab Potential: Good ELOS: 7-10 days   Today's Date: 02/05/2014 PT Individual Time: [phone]; 60 minutes   Problem List:  Patient Active Problem List   Diagnosis Date Noted  . Prostate cancer metastatic to bone 02/04/2014  . Paraparesis of both lower limbs 02/04/2014  . Spine metastasis 01/31/2014  . Spinal cord tumor   . Insomnia   . Lower extremity weakness   . Spinal cord compression 01/26/2014  . 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 Malignant neoplasm of prostate 07/25/2013  . Chronic cholecystitis with calculus 10/08/2012    Past Medical History:  Past Medical History  Diagnosis Date  . History of radiation therapy 10/14/12-10/25/12    T7-T11   . Prostate cancer   . Metastatic cancer to spine    Past Surgical History:  Past Surgical History  Procedure Laterality Date  . Appendectomy    . Colon surgery    . Cholecystectomy    . Cataract extraction Bilateral   . Laminectomy N/A 01/29/2014    Procedure: Thoracic laminectomy for tumor resection;  Surgeon: Hosie Spangle, MD;  Location: [facility];  Service: Neurosurgery;  Laterality: N/A;  Thoracic laminectomy for tumor resection    Assessment & Plan Clinical Impression: Clarence Pollard is a 73 y.o. 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 He was admitted on 01/26/14 with progressive weakness BLE with increased pain from lower thoracic spine to BLE with numbness and difficulty walking. MRI of spine with increasing epidural confluent tumor from T8 - T10 resulting in severe canal stenosis and cord compression at T9, diffuse osseous  mets as well as severe lumbar spondylosis with canal stenosis L5-S1 and neural foraminal narrowing L2/3- L5/S1. He was started on IV steroids and evaluated by Dr. Rita Ohara who recommended thoracic laminectomy for decompression of tumor to help with current symptoms and elected lumbar decompression in the future for neurogenic claudication. Patient underwent T8-T10 thoracic laminectomy with resection of tumor and decompression of spinal cord on 01/29/14. Dr. Lisbeth Renshaw consulted for input and recommends XRT with CT myelogram in the future. Dr. Alen Blew following for input and recommends systemic therapy once this episode resolves. To start decadron taper per Dr. Rita Ohara.  Patient transferred to CIR on 02/04/2014 .   Patient currently requires mod with mobility secondary to muscle weakness, decreased cardiorespiratoy endurance and decreased oxygen support and impaired timing and sequencing, ataxia and decreased coordination.  Prior to hospitalization, patient was modified independent  with mobility and lived with Alone in a House home.  Home access is 2 steps from garageStairs to enter.  Patient will benefit from skilled PT intervention to maximize safe functional mobility, minimize fall risk and decrease caregiver burden for planned discharge home with intermittent assist.  Anticipate patient will benefit from follow up OP at discharge.  PT - End of Session Activity Tolerance: Tolerates 10 - 20 min activity with multiple rests Endurance Deficit: Yes Endurance Deficit Description: low activity tolerance, impaired cardiorespiratory endurance, impaired oxygen support (2L/min at night) PT Assessment Rehab Potential (ACUTE/IP ONLY): Good Barriers to Discharge: [place] home environment;Decreased caregiver support Barriers to Discharge Comments: Pt lives home alone, but plans on moving to ILF at  d/c PT Patient demonstrates impairments in the following area(s):  Balance;Edema;Endurance;Motor;Pain;Safety;Sensory;Skin Integrity PT Transfers Functional Problem(s): Bed Mobility;Bed to Chair;Car;Furniture PT Locomotion Functional Problem(s): Ambulation;Wheelchair Mobility;Stairs PT Plan PT Intensity: Minimum of 1-2 x/day ,45 to 90 minutes PT Frequency: 5 out of 7 days PT Duration Estimated Length of Stay: 7-10 days PT Treatment/Interventions: Ambulation/gait training;DME/adaptive equipment instruction;Psychosocial support;UE/LE Strength taining/ROM;Balance/vestibular training;Skin care/wound management;UE/LE Coordination activities;Functional mobility training;Splinting/orthotics;Community reintegration;Neuromuscular re-education;Stair training;Wheelchair propulsion/positioning;Discharge planning;Pain management;Therapeutic Activities;Disease management/prevention;Patient/family education;Therapeutic Exercise PT Transfers Anticipated Outcome(s): Mod I PT Locomotion Anticipated Outcome(s): Mod I PT Recommendation Follow Up Recommendations: Outpatient PT (tbd pending progress) Patient destination: Assisted Living (ILF) Equipment Recommended: To be determined Equipment Details: Pt already owns a SPC, RW (llate wife's), w/c (late wife's) - PT instructs pt to have family member bring in walker so PT can assess if it is appropriate height  Skilled Therapeutic Intervention PT Evaluation: PT completes evaluation and finds that pt does not recall spinal precautions and has difficulty with bed mobility and functional mobility while following spinal precautions, req up to mod A for help. Pt demonstrates impaired dynamic standing balance and has difficulty with gait on level surfaces (with RW) and stairs, likely due to ataxia and proprioceptive deficits in B LEs, but plans on d/c to an ALF, likely independent living side, to reduce his loneliness. Pt is a widower of last year and enjoys the company of others.   Gait Training: PT instructs pt in ambulation with RW x 150'  req min A for balance and verbal cues to widen BOS. PT instructs pt in ascending/descending 2 stairs with B rails req min A and verbal cues for technique.   Therapeutic Activity: PT instructs pt in transfer without AD, but using armrests, req mod A for balance and placement from recliner to bed. PT instructs pt in sit to side lie transfer in bed req mod A for B LEs to follow precautions, min A progressing to CGA for practice log rolling from supine to R side x 5 reps, and min A for R side lie to sit transfer edge of bed.  PT instructs pt in min A transfer w/c to recliner transfer with RW.   Pt will benefit from IPR PT to maximize independence and safety. Continue per PT POC.    PT Evaluation Precautions/Restrictions Precautions Precautions: Fall;Back Precaution Booklet Issued: No Restrictions Weight Bearing Restrictions: No General Chart Reviewed: Yes Family/Caregiver Present: No Vital Signs Pain Pain Assessment Pain Assessment: 0-10 Pain Score: 5  Pain Type: Surgical pain Pain Location: Back Pain Orientation: Mid Pain Descriptors / Indicators: Pressure Pain Onset: On-going Pain Intervention(s): Rest;Repositioned Multiple Pain Sites: No Home Living/Prior Functioning Home Living Available Help at Discharge: Available PRN/intermittently;Family Type of Home: House Home Access: Stairs to enter [employer] of Steps: 2 steps from garage Entrance Stairs-Rails: None (wall on the L on the way in/up) Home Layout: Two level Alternate Level Stairs-Number of Steps: 17-20 steps Alternate Level Stairs-Rails: Can reach both  Lives With: Alone Prior Function Level of Independence: Requires assistive device for independence;Other (comment)  Able to Take Stairs?: Yes Driving: Yes Vocation: Retired Leisure: Hobbies-no Comments: Pt was using a SPC immediately prior to this hospital admission, but 1 week prior to that he was independent without AD Cognition Overall Cognitive  Status: Within Functional Limits for tasks assessed Arousal/Alertness: Awake/alert Orientation Level: Oriented X4 Attention: Focused Focused Attention: Appears intact Selective Attention: Appears intact Memory: Appears intact Awareness: Appears intact Problem Solving: Appears intact Safety/Judgment: Appears intact Comments: needs cues for safety  and supervision during transfers Sensation Sensation Light Touch: Impaired Detail Light Touch Impaired Details: Impaired LLE;Impaired RLE Stereognosis: Not tested Hot/Cold: Not tested Proprioception: Impaired Detail Proprioception Impaired Details: Impaired RLE Additional Comments: tingling in B feet, impaired proprioception in R ankle Coordination Gross Motor Movements are Fluid and Coordinated: Yes Fine Motor Movements are Fluid and Coordinated: Not tested Finger Nose Finger Test: wfl bilaterally - slight intention tremor R UE Heel Shin Test: wfl bilaterally  Motor  Motor Motor: Ataxia;Abnormal postural alignment and control Motor - Skilled Clinical Observations: slouched sitting posture  Mobility Bed Mobility Bed Mobility: Rolling Right;Right Sidelying to Sit;Sit to Supine Rolling Right: 4: Min assist Rolling Right Details: Verbal cues for technique;Manual facilitation for placement Rolling Right Details (indicate cue type and reason): log roll technique Right Sidelying to Sit: 4: Min assist Right Sidelying to Sit Details: Verbal cues for technique;Manual facilitation for placement Right Sidelying to Sit Details (indicate cue type and reason): to follow spinal precautions Sit to Supine: 3: Mod assist Sit to Supine - Details: Manual facilitation for placement;Verbal cues for technique Sit to Supine - Details (indicate cue type and reason): to follow spinal precautions, assist for B LEs Transfers Transfers: Yes Sit to Stand: 3: Mod assist;From bed;With upper extremity assist Sit to Stand Details: Manual facilitation for  placement;Verbal cues for technique Stand to Sit: 4: Min assist;With armrests;With upper extremity assist Stand to Sit Details (indicate cue type and reason): Manual facilitation for placement Stand Pivot Transfers: 3: Mod assist Stand Pivot Transfer Details: Verbal cues for technique;Manual facilitation for weight shifting;Manual facilitation for placement Stand Pivot Transfer Details (indicate cue type and reason): assist due to impaired balance and decreased motor control of legs Locomotion  Ambulation Ambulation: Yes Ambulation/Gait Assistance: 4: Min assist Ambulation Distance (Feet): 150 Feet Assistive device: Rolling walker Ambulation/Gait Assistance Details: Verbal cues for technique;Manual facilitation for placement Gait Gait: Yes Gait Pattern: Impaired Gait Pattern: Narrow base of support;Trunk flexed;Right foot flat;Left foot flat Gait velocity: decreased Stairs / Additional Locomotion Stairs: Yes Stairs Assistance: 4: Min assist Stairs Assistance Details: Manual facilitation for placement;Verbal cues for technique Stairs Assistance Details (indicate cue type and reason): up with strong leg, down with weak leg Stair Management Technique: Two rails Number of Stairs: 2 Product manager Mobility: Yes Wheelchair Assistance: 5: Investment banker, operational Details: Verbal cues for Marketing executive: Both upper extremities Wheelchair Parts Management: Needs assistance Distance: 150  Trunk/Postural Assessment  Cervical Assessment Cervical Assessment: Exceptions to [facility] Cervical AROM Overall Cervical AROM: Deficits;Due to premorid status Overall Cervical AROM Comments: forward head and neck down Thoracic Assessment Thoracic Assessment: Exceptions to [facility] Thoracic AROM Overall Thoracic AROM: Deficits;Due to precautions Overall Thoracic AROM Comments: spinal precautions Postural Control Postural Control: Deficits on evaluation Postural  Limitations: generalized slouched sitting posture from premorbid status  Balance Balance Balance Assessed: Yes Static Sitting Balance Static Sitting - Balance Support: Right upper extremity supported;Left upper extremity supported;Feet supported Static Sitting - Level of Assistance: 5: Stand by assistance Dynamic Sitting Balance Dynamic Sitting - Balance Support: Left upper extremity supported;Right upper extremity supported;Feet supported Dynamic Sitting - Level of Assistance: 5: Stand by assistance Static Standing Balance Static Standing - Balance Support: Bilateral upper extremity supported;During functional activity Static Standing - Level of Assistance: 4: Min assist Dynamic Standing Balance Dynamic Standing - Balance Support: Bilateral upper extremity supported;During functional activity Dynamic Standing - Level of Assistance: 3: Mod assist Dynamic Standing - Balance Activities: Lateral lean/weight shifting;Forward lean/weight shifting Dynamic Standing - Comments:  decreased awareness of cone of stability Extremity Assessment  RUE Assessment RUE Assessment: Within Functional Limits LUE Assessment LUE Assessment: Within Functional Limits RLE Assessment RLE Assessment: Exceptions to [facility] RLE AROM (degrees) Overall AROM Right Lower Extremity: Deficits;Due to premorbid status RLE Overall AROM Comments: knee flexion limited by general stiffness RLE Strength RLE Overall Strength: Deficits RLE Overall Strength Comments: grossly 4/5 LLE Assessment LLE Assessment: Exceptions to WFL LLE AROM (degrees) Overall AROM Left Lower Extremity: Deficits;Due to premorbid status LLE Overall AROM Comments: knee flexion limited by stiffness LLE Strength LLE Overall Strength: Deficits LLE Overall Strength Comments: frossly 4/5  FIM:  FIM - Control and instrumentation engineer Devices: [facility] elevated;Walker;Bed rails Bed/Chair Transfer: 5: Supine > Sit: Supervision (verbal cues/safety  issues);5: Sit > Supine: Supervision (verbal cues/safety issues);4: Bed > Chair or W/C: Min A (steadying Pt. > 75%);4: Chair or W/C > Bed: Min A (steadying Pt. > 75%) FIM - Locomotion: Wheelchair Distance: 150 FIM - Locomotion: Ambulation Ambulation/Gait Assistance: 4: Min assist   Refer to Care Plan for Long Term Goals  Recommendations for other services: None  Discharge Criteria: Patient will be discharged from PT if patient refuses treatment 3 consecutive times without medical reason, if treatment goals not met, if there is a change in medical status, if patient makes no progress towards goals or if patient is discharged from hospital.  The above assessment, treatment plan, treatment alternatives and goals were discussed and mutually agreed upon: by patient  [facility] M 02/05/2014, 10:49 AM

## 2014-02-05 NOTE — Progress Notes (Signed)
Mild hypokalemia noted on labs--question due to hemolysis. Will recheck for confirmation.

## 2014-02-05 NOTE — Progress Notes (Signed)
Recreational Therapy Assessment and Plan  Patient Details  Name: Clarence Pollard MRN: [id number] Date of Birth: 1941-01-16 Today's Date: 02/05/2014  Rehab Potential: Good ELOS: 10 days   Assessment Clinical Impression: Problem List:  Patient Active Problem List   Diagnosis Date Noted  . Prostate cancer metastatic to bone 02/04/2014  . Paraparesis of both lower limbs 02/04/2014  . Spine metastasis 01/31/2014  . Spinal cord tumor   . Insomnia   . Lower extremity weakness   . Spinal cord compression 01/26/2014  . 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 Malignant neoplasm of prostate 07/25/2013  . Chronic cholecystitis with calculus 10/08/2012    Past Medical History:  Past Medical History  Diagnosis Date  . History of radiation therapy 10/14/12-10/25/12    T7-T11   . Prostate cancer   . Metastatic cancer to spine    Past Surgical History:  Past Surgical History  Procedure Laterality Date  . Appendectomy    . Colon surgery    . Cholecystectomy    . Cataract extraction Bilateral   . Laminectomy N/A 01/29/2014    Procedure: Thoracic laminectomy for tumor resection; Surgeon: Hosie Spangle, MD; Location: [place] [facility]; Service: Neurosurgery; Laterality: N/A; Thoracic laminectomy for tumor resection    Assessment & Plan Clinical Impression: Patient is a 73 y.o. year old male with history of prostate cancer with mets to spine and palliative radiation to T7-T12 vertebral bodies 10/2012. He was admitted on 01/26/14 with progressive weakness BLE with increased pain from lower thoracic spine to BLE with numbness and difficulty walking. MRI of spine with increasing epidural confluent tumor from T8 - T10 resulting in severe canal stenosis and cord compression at T9, diffuse osseous mets as well as severe lumbar spondylosis with canal stenosis L5-S1 and neural foraminal narrowing L2/3-  L5/S1. He was started on IV steroids and evaluated by Dr. Rita Ohara who recommended thoracic laminectomy for decompression of tumor to help with current symptoms and elected lumbar decompression in the future for neurogenic claudication. Patient underwent T8-T10 thoracic laminectomy with resection of tumor and decompression of spinal cord on 01/29/14. Dr. Lisbeth Renshaw consulted for input and recommends XRT with CT myelogram in the future. Dr. Alen Blew following for input and recommends systemic therapy once this episode resolves. To start decadron taper per Dr. Rita Ohara. Pt transferred to CIR 02/04/14.  Pt presents with decreased activity tolerance, decreased functional mobility, decreased balance Limiting pt's independence with leisure/community pursuits.   Leisure History/Participation Premorbid leisure interest/current participation: Medical laboratory scientific officer - Travel (Comment);Community - Psychologist, forensic (out to eat with a group of family and/or friends multiple time a week) Other Leisure Interests: Television;Reading Leisure Participation Style: With Family/Friends Awareness of Community Resources: Excellent Psychosocial / Spiritual Social interaction - Mood/Behavior: Cooperative Academic librarian Appropriate for Education?: Yes Recreational Therapy Orientation Orientation -Reviewed with patient: Available activity resources Strengths/Weaknesses Patient Strengths/Abilities: Willingness to participate;Active premorbidly Patient weaknesses: Physical limitations TR Patient demonstrates impairments in the following area(s): Endurance;Pain  Plan Rec Therapy Plan Is patient appropriate for Therapeutic Recreation?: Yes Rehab Potential: Good Treatment times per week: Min 1 time per week >20 minutes Estimated Length of Stay: 10 days TR Treatment/Interventions: Adaptive equipment instruction;1:1 session;Balance/vestibular training;Functional mobility training;Community  reintegration;Patient/family education;Therapeutic activities;Recreation/leisure participation;Therapeutic exercise;UE/LE Coordination activities  Recommendations for other services: None  Discharge Criteria: Patient will be discharged from TR if patient refuses treatment 3 consecutive times without medical reason.  If treatment goals not met, if there is a change in medical status,  if patient makes no progress towards goals or if patient is discharged from hospital.  The above assessment, treatment plan, treatment alternatives and goals were discussed and mutually agreed upon: by patient  [place] 02/05/2014, 12:06 PM

## 2014-02-05 NOTE — Progress Notes (Addendum)
[facility]IL[facility]     PROGRESS NOTE    Subjective/Complaints: Had a fair night. A little restless. Feet feel "tight' still. Pain controlled. Staples out  Objective: Vital Signs: Blood pressure 146/70, pulse 56, temperature 97.4 F (36.3 C), temperature source Oral, resp. rate 18, height 6' (1.829 m), SpO2 100 %. No results found.  Recent Labs  02/03/14 0602 02/05/14 0501  WBC 13.9* 14.2*  HGB 10.4* 10.8*  HCT 34.3* 35.3*  PLT 214 216    Recent Labs  02/03/14 0602 02/05/14 0501  NA 137 137  K 4.5 5.6*  CL 98 96  GLUCOSE 147* 124*  BUN 25* 28*  CREATININE 0.70 0.74  CALCIUM 7.0* 7.9*   CBG (last 3)   Recent Labs  02/04/14 1631 02/04/14 2109 02/05/14 0645  GLUCAP 203* 154* 118*    Wt Readings from Last 3 Encounters:  01/27/14 116.3 kg (256 lb 6.3 oz)  01/26/14 120.657 kg (266 lb)  01/26/14 120.657 kg (266 lb)    Physical Exam:  Constitutional: He is oriented to person, place, and time. He appears well-developed and well-nourished.  HENT: dentition fair,  Head: Normocephalic and atraumatic.  Eyes: Conjunctivae are normal. Pupils are equal, round, and reactive to light.  Neck: Normal range of motion. Neck supple.  Cardiovascular: Normal rate and regular rhythm.  Respiratory: Effort normal and breath sounds normal. No respiratory distress. He has no wheezes.  GI: Soft. Bowel sounds are normal.  Neurological: He is alert and oriented to person, place, and time.  Follows commands without difficulty. Decrease in fine motor movements R>LLE. Mild sensory deficits BLE  Decreased proprioception in feet especially.  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 Otherwise UE's 4/5. LE: 4- hf, 4 ke and 4/5 adf/apf. Cognitively intact  Skin: Skin is warm and dry. Back incision clean. Staples out Psychiatric: He has a normal mood and affect. His behavior is normal. Judgment and thought content normal  Assessment/Plan: 1. 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 Physiatrist is providing close team supervision and 24 hour management of active medical problems listed below. Physiatrist and rehab team continue to assess barriers to discharge/monitor patient progress toward functional and medical goals. FIM: FIM - Bathing Bathing Steps Patient Completed: Chest, Right Arm, Left Arm, Abdomen, Front perineal area, Right upper leg, Left upper leg Bathing: 3: Mod-Patient completes 5-7 21f 10 parts or 50-74%  FIM - Upper Body Dressing/Undressing Upper body dressing/undressing: 0: Activity did not occur FIM - Lower Body Dressing/Undressing Lower body dressing/undressing steps patient completed: Pull pants up/down Lower body dressing/undressing: 2: Max-Patient completed 25-49% of tasks  FIM - Toileting Toileting steps completed by patient: Adjust clothing prior to toileting, Performs perineal hygiene, Adjust clothing after toileting Toileting: 5: Supervision: Safety issues/verbal cues  FIM - Radio producer Devices: Environmental consultant, Product manager Transfers: 5-To toilet/BSC: Supervision (verbal cues/safety issues), 5-From toilet/BSC: Supervision (verbal cues/safety issues)  FIM - Control and instrumentation engineer Devices: HOB elevated, Walker, Bed rails Bed/Chair Transfer: 5: Supine > Sit: Supervision (verbal cues/safety issues), 5: Sit > Supine: Supervision (verbal cues/safety issues), 4: Bed > Chair or W/C: Min A (steadying Pt. > 75%), 4: Chair or W/C > Bed: Min A (steadying Pt. > 75%)     Comprehension Comprehension Mode: Auditory Comprehension: 5-Understands basic 90% of the time/requires cueing < 10% of the time  Expression  Expression Mode: Verbal Expression: 5-Expresses basic needs/ideas: With no assist  Social Interaction Social  Interaction: 6-Interacts appropriately with others with medication or extra time (anti-anxiety, antidepressant).  Problem Solving Problem Solving: 5-Solves basic 90% of the time/requires cueing < 10% of the time  Memory Memory: 5-Recognizes or recalls 90% of the time/requires cueing < 10% of the time Medical Problem List and Plan: 1. 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 DVT Prophylaxis/Anticoagulation: SCD's, gait/mobility 3. Pain Management: N/A--denies any discomfort.  4. Mood: LCSW to follow for evaluation and support.  5. Neuropsych: This patient is capable of making decisions on his own behalf. 6. Skin/Wound Care: Routine pressure relief measures. Monitor wound for Healing.  7. Fluids/Electrolytes/Nutrition: Will monitor I/O. Offer nutritional supplements prn poor intake. Acute renal insufficiency resolving.   -k+ likely hemolyzed---recheck today 8. Reactive leucocytosis: Likely due to surgery as well as steroids.  decadron taper   9. Acute on chronic anemia: Continue iron supplement.  10. Steroid induced hyperglycemia: Will continue CBG checks for now and use SSI for elevated BS    LOS (Days) 1 A FACE TO FACE EVALUATION WAS PERFORMED  Shelena Castelluccio T 02/05/2014 10:06 AM

## 2014-02-05 NOTE — Plan of Care (Signed)
Problem: SCI BOWEL ELIMINATION Goal: RH STG MANAGE BOWEL WITH ASSISTANCE STG Manage Bowel with mod I Assistance.  Outcome: Progressing Goal: RH STG SCI MANAGE BOWEL WITH MEDICATION WITH ASSISTANCE STG SCI Manage bowel with medication with Mod I assistance.  Outcome: Progressing  Problem: RH SKIN INTEGRITY Goal: RH STG ABLE TO PERFORM INCISION/WOUND CARE W/ASSISTANCE STG Able To Perform Incision/Wound Care With Total Assistance from caregiver.  Outcome: Progressing  Problem: RH PAIN MANAGEMENT Goal: RH STG PAIN MANAGED AT OR BELOW PT'S PAIN GOAL < 4  Outcome: Progressing

## 2014-02-06 ENCOUNTER — Inpatient Hospital Stay (HOSPITAL_COMMUNITY): Payer: Medicare Other

## 2014-02-06 ENCOUNTER — Inpatient Hospital Stay (HOSPITAL_COMMUNITY): Payer: Medicare Other | Admitting: Physical Therapy

## 2014-02-06 DIAGNOSIS — E876 Hypokalemia: Secondary | ICD-10-CM | POA: Insufficient documentation

## 2014-02-06 LAB — GLUCOSE, CAPILLARY
GLUCOSE-CAPILLARY: 136 mg/dL — AB (ref 70–99)
Glucose-Capillary: 128 mg/dL — ABNORMAL HIGH (ref 70–99)
Glucose-Capillary: 115 mg/dL — ABNORMAL HIGH (ref 70–99)
Glucose-Capillary: 177 mg/dL — ABNORMAL HIGH (ref 70–99)

## 2014-02-06 NOTE — Care Management Note (Signed)
[place] Individual Statement of Services  Patient Name:  Clarence Pollard  Date:  02/06/2014  Welcome to the [facility].  Our goal is to provide you with an individualized program based on your diagnosis and situation, designed to meet your specific needs.  With this comprehensive rehabilitation program, you will be expected to participate in at least 3 hours of rehabilitation therapies Monday-Friday, with modified therapy programming on the weekends.  Your rehabilitation program will include the following services:  Physical Therapy (PT), Occupational Therapy (OT), 24 hour per day rehabilitation nursing, Therapeutic Recreaction (TR), Psychology, Case Management (Social Worker), Rehabilitation Medicine, Nutrition Services and Pharmacy Services  Weekly team conferences will be held on Tuesdays to discuss your progress.  Your Social Worker will talk with you frequently to get your input and to update you on team discussions.  Team conferences with you and your family in attendance may also be held.  Expected length of stay: 7-10 days  Overall anticipated outcome: modified independent  Depending on your progress and recovery, your program may change. Your Social Worker will coordinate services and will keep you informed of any changes. Your Social Worker's name and contact numbers are listed  below.  The following services may also be recommended but are not provided by the [facility] will be made to provide these services after discharge if needed.  Arrangements include referral to agencies that provide these services.  Your insurance has been verified to be:  [employer] Your primary doctor is:  Dr. Arelia Sneddon  Pertinent information will be shared with your doctor and your insurance company.  Social Worker:  [place], [place] or ([phone]   Information discussed with and copy given to patient by: Lennart Pall, 02/06/2014, 2:47 PM

## 2014-02-06 NOTE — Progress Notes (Addendum)
Occupational Therapy Session Note  Patient Details  Name: Clarence Pollard MRN: [id number] Date of Birth: August 20, 1940  Today's Date: 02/06/2014 OT Individual Time: 6568-1275 OT Individual Time Calculation (min): 60 min   Short Term Goals: Week 1:  OT Short Term Goal 1 (Week 1): STG=LTG d/t short ELOS  Skilled Therapeutic Interventions/Progress Updates: ADL-retraining at shower-level with focus on improved use of AE to maintain conservative back precautions, transfers, dynamic sitting/standing balance, and functional mobility using RW.  Prior to session, pt reported BM with assist from physical therapist for toilet clothing management and hygiene.   Pt reported use of toilet aid with limited success during initial trial.   Pt bathed with overall min assist - supervision for safety and was re-educated on use of LH sponge, sock aid, reacher and shoe post to minimize "bending" during B & D.   Pt able to ambulate to and from recliner for bathing and dressing but demo's scissoring gait with diminished awareness of posture and dynamic balance deficits.      Therapy Documentation Precautions:  Precautions Precautions: Fall, Back Precaution Booklet Issued: No Restrictions Weight Bearing Restrictions: No  Pain: Pain Assessment Pain Assessment: 0-10 Pain Score: 3  Pain Type: Surgical pain Pain Location: Back Pain Orientation: Mid Pain Descriptors / Indicators: Pressure Pain Onset: Gradual Pain Intervention(s): Rest;Repositioned Multiple Pain Sites: No  ADL: ADL ADL Comments: see FIM  See FIM for current functional status  Therapy/Group: Individual Therapy   Second session: Time: 1300-1400 Time Calculation (min):  60 min  Pain Assessment: No/denies pain  Skilled Therapeutic Interventions: Therapeutic activities with emphasis on improved endurance, functional mobility using RW, and dynamic standing balance.   Pt received seated in recliner finishing his lunch with guest in his room.    Pt receptive for planned session to ambulate to gym and perform sit<>stand and dynamic standing task: Wii bowlilng.  Pt ambulated to gym with min guard assist and mod vc for gait and postural corrections.   After brief rest on raised mat, pt completed 1 game of Wii bowling, standing supported at [place] with min-mod assist to maintain standing balance.   Pt completed sit<>stand 10 times during game play with vc for improved posture and stance d/t forward head tilt and narrow base of support.   Pt required intermittent vc for transfer technique to reach with his hands for arm rests when returning to sitting at w/c during each frame.   At end of game, pt ambulated back to his room with 2 cues for direction d/t mild confusion.   See FIM for current functional status  Therapy/Group: Individual Therapy  Allura Doepke 02/06/2014, 10:39 AM

## 2014-02-06 NOTE — Progress Notes (Signed)
Pt does not want to wear CPAP at this time. Pt has refused CPAP for 5 nights now. RT will continue to monitor.

## 2014-02-06 NOTE — Plan of Care (Signed)
Problem: SCI BOWEL ELIMINATION Goal: RH STG MANAGE BOWEL WITH ASSISTANCE STG Manage Bowel with mod I Assistance.  Outcome: Progressing Goal: RH STG SCI MANAGE BOWEL WITH MEDICATION WITH ASSISTANCE STG SCI Manage bowel with medication with Mod I assistance.  Outcome: Progressing  Problem: RH SKIN INTEGRITY Goal: RH STG ABLE TO PERFORM INCISION/WOUND CARE W/ASSISTANCE STG Able To Perform Incision/Wound Care With Total Assistance from caregiver.  Outcome: Progressing  Problem: RH PAIN MANAGEMENT Goal: RH STG PAIN MANAGED AT OR BELOW PT'S PAIN GOAL < 4  Outcome: Progressing

## 2014-02-06 NOTE — Progress Notes (Signed)
Physical Therapy Session Note  Patient Details  Name: Clarence Pollard MRN: [id number] Date of Birth: 07-13-1940  Today's Date: 02/06/2014 PT Individual Time: 0830-0930 PT Individual Time Calculation (min): 60 min   Short Term Goals: Week 1:  PT Short Term Goal 1 (Week 1): STGs = LTGs due to ELOS  Skilled Therapeutic Interventions/Progress Updates:    Gait Training: PT instructs pt in ambulation with RW x 160' x 2 req CGA-min A for safety and verbal cues to drop shoulders, look up, and keep feet apart (pt tends to walk almost in tandem). No LOB req assist to correct  Neuromuscular Reeducation: PT administers the [facility] Test and pt scores 7/56, indicating high falls risk.   Therapeutic Activity: PT instructs pt in toilet transfer to and from toilet with RW and grab bar req min A - pt demonstrates a plop onto toilet seat.  Pt req assist for posterior hygiene hygiene and clothing management after bowel movement.   Pt has significant balance deficit, as evidenced by low score on the Berg. During balance test, PT instructs pt to shift weight forward and to the L, as his tendency is to stand retropulsively and leaning R, with bottom sticking out hinged at the hips. Pt is unable to correct balance with these abstract cues, but when PT instructs pt in functional dynamic standing balance activities, pt demonstrates a lesser need for support. Pt will do well with functional standing balance tasks to improve upright safety. Continue per PT POC.   Therapy Documentation Precautions:  Precautions Precautions: Fall, Back Precaution Booklet Issued: No Restrictions Weight Bearing Restrictions: No Pain: Pain Assessment Pain Assessment: 0-10 Pain Score: 3  Pain Type: Surgical pain Pain Location: Back Pain Orientation: Mid Pain Descriptors / Indicators: Pressure Pain Onset: Gradual Pain Intervention(s): Rest;Repositioned Multiple Pain Sites: No  Balance: Balance Balance Assessed:  Yes Standardized Balance Assessment Standardized Balance Assessment: Berg Balance Test Berg Balance Test Sit to Stand: Needs minimal aid to stand or to stabilize Standing Unsupported: Unable to stand 30 seconds unassisted Sitting with Back Unsupported but Feet Supported on Floor or Stool: Able to sit safely and securely 2 minutes Stand to Sit: Needs assistance to sit Transfers: Needs one person to assist Standing Unsupported with Eyes Closed: Needs help to keep from falling Standing Ubsupported with Feet Together: Needs help to attain position and unable to hold for 15 seconds From Standing, Reach Forward with Outstretched Arm: Loses balance while trying/requires external support From Standing Position, Pick up Object from Floor: Unable to try/needs assist to keep balance From Standing Position, Turn to Look Behind Over each Shoulder: Needs assist to keep from losing balance and falling (not tested due to Spinal precautions) Turn 360 Degrees: Needs assistance while turning Standing Unsupported, Alternately Place Feet on Step/Stool: Able to complete >2 steps/needs minimal assist Standing Unsupported, One Foot in Front: Loses balance while stepping or standing Standing on One Leg: Unable to try or needs assist to prevent fall Total Score: 7  See FIM for current functional status  Therapy/Group: Individual Therapy  Clarence Pollard 02/06/2014, 8:37 AM

## 2014-02-06 NOTE — Progress Notes (Signed)
[facility] PHYSICAL MEDICINE & REHABILITATION     PROGRESS NOTE    Subjective/Complaints: Feeling well. Feet feel more normal. Had a good day with therapies yesterda Objective: Vital Signs: Blood pressure 125/69, pulse 79, temperature 97.5 F (36.4 C), temperature source Oral, resp. rate 17, height 6' (1.829 m), SpO2 100 %. No results found.  Recent Labs  02/05/14 0501  WBC 14.2*  HGB 10.8*  HCT 35.3*  PLT 216    Recent Labs  02/05/14 0501 02/05/14 1024  NA 137 137  K 5.6* 5.8*  CL 96 97  GLUCOSE 124* 124*  BUN 28* 29*  CREATININE 0.74 0.66  CALCIUM 7.9* 8.0*   CBG (last 3)   Recent Labs  02/05/14 1627 02/05/14 2110 02/06/14 0650  GLUCAP 122* 153* 128*    Wt Readings from Last 3 Encounters:  01/27/14 116.3 kg (256 lb 6.3 oz)  01/26/14 120.657 kg (266 lb)  01/26/14 120.657 kg (266 lb)    Physical Exam:  Constitutional: He is oriented to person, place, and time. He appears well-developed and well-nourished.  HENT: dentition fair,  Head: Normocephalic and atraumatic.  Eyes: Conjunctivae are normal. Pupils are equal, round, and reactive to light.  Neck: Normal range of motion. Neck supple.  Cardiovascular: Normal rate and regular rhythm.  Respiratory: Effort normal and breath sounds normal. No respiratory distress. He has no wheezes.  GI: Soft. Bowel sounds are normal.  Neurological: He is alert and oriented to person, place, and time.   Decrease in fine motor movements R>LLE. Mild sensory deficits BLE  Decreased proprioception in feet especially.  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   UE's 4/5. LE: 4- hf, 4 ke and 4/5 adf/apf. Cognitively intact  Skin: Skin is warm and dry. Back incision clean. Staples out Psychiatric: He has a normal mood and affect. His behavior is normal. Judgment and thought content normal  Assessment/Plan: 1. 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 Physiatrist is providing close team supervision and 24 hour management of active medical problems listed below. Physiatrist and rehab team continue to assess barriers to discharge/monitor patient progress toward functional and medical goals. FIM: FIM - Bathing Bathing Steps Patient Completed: Chest, Right Arm, Left Arm, Abdomen, Front perineal area, Right upper leg, Left upper leg Bathing: 3: Mod-Patient completes 5-7 36f 10 parts or 50-74%  FIM - Upper Body Dressing/Undressing Upper body dressing/undressing: 0: Activity did not occur FIM - Lower Body Dressing/Undressing Lower body dressing/undressing steps patient completed: Thread/unthread right pants leg, Thread/unthread left underwear leg Lower body dressing/undressing: 1: Total-Patient completed less than 25% of tasks  FIM - Toileting Toileting steps completed by patient: Adjust clothing prior to toileting Toileting: 2: Max-Patient completed 1 of 3 steps  FIM - Radio producer Devices: Environmental consultant, Product manager Transfers: 5-To toilet/BSC: Supervision (verbal cues/safety issues), 5-From toilet/BSC: Supervision (verbal cues/safety issues)  FIM - Control and instrumentation engineer Devices: Arm rests Bed/Chair Transfer: 4: Supine > Sit: Min A (steadying Pt. > 75%/lift 1 leg), 3: Sit > Supine: Mod A (lifting assist/Pt. 50-74%/lift 2 legs), 3: Bed > Chair or W/C: Mod A (lift or lower assist), 3: Chair or W/C > Bed: Mod A (lift or lower assist)  FIM - Locomotion: Wheelchair Distance: 150 Locomotion: Wheelchair: 5: Travels 150 ft or more: maneuvers on rugs and over door sills with supervision, cueing or coaxing FIM -  Locomotion: Ambulation Locomotion: Ambulation Assistive Devices: Administrator Ambulation/Gait Assistance: 4: Min assist Locomotion: Ambulation: 4: Travels 150 ft or more with minimal assistance  (Pt.>75%)  Comprehension Comprehension Mode: Auditory Comprehension: 5-Understands basic 90% of the time/requires cueing < 10% of the time  Expression Expression Mode: Verbal Expression: 5-Expresses basic needs/ideas: With no assist  Social Interaction Social Interaction: 5-Interacts appropriately 90% of the time - Needs monitoring or encouragement for participation or interaction.  Problem Solving Problem Solving: 5-Solves basic 90% of the time/requires cueing < 10% of the time  Memory Memory: 5-Recognizes or recalls 90% of the time/requires cueing < 10% of the time   Medical Problem List and Plan: 1. 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 DVT Prophylaxis/Anticoagulation: SCD's, gait/mobility 3. Pain Management: N/A--denies any discomfort.  4. Mood: LCSW to follow for evaluation and support.  5. Neuropsych: This patient is capable of making decisions on his own behalf. 6. Skin/Wound Care: Routine pressure relief measures. Monitor wound for Healing.  7. Fluids/Electrolytes/Nutrition: Will monitor I/O. Offer nutritional supplements prn poor intake. Acute renal insufficiency resolving.   -k+ likely hemolyzed again 8. Reactive leucocytosis: Likely due to surgery as well as steroids.  decadron taper   9. Acute on chronic anemia: Continue iron supplement.  10. Steroid induced hyperglycemia: Will continue CBG checks for now and use SSI for elevated BS    LOS (Days) 2 A FACE TO FACE EVALUATION WAS PERFORMED  SWARTZ,ZACHARY T 02/06/2014 8:29 AM

## 2014-02-06 NOTE — IPOC Note (Signed)
Overall Plan of Care [facility]) Patient Details Name: Clarence Pollard MRN: [id number] DOB: [birth year]  Admitting Diagnosis: thoracic tumor resection  Hospital Problems: Principal Problem:   Prostate cancer metastatic to bone Active Problems:   Spinal cord compression   Paraparesis of both lower limbs     Functional Problem List: Nursing Endurance, Medication Management, Motor, Skin Integrity, Pain  PT Balance, Edema, Endurance, Motor, Pain, Safety, Sensory, Skin Integrity  OT    SLP    TR Endurance, Pain       Basic ADL's: OT Bathing, Dressing, Toileting     Advanced  ADL's: OT       Transfers: PT Bed Mobility, Bed to Chair, Car, Manufacturing systems engineer, Metallurgist: PT Ambulation, Emergency planning/management officer, Stairs     Additional Impairments: OT    SLP        TR      Anticipated Outcomes Item Anticipated Outcome  Self Feeding Mod I  Swallowing      Basic self-care  Mod I  Toileting  Mod I   Bathroom Transfers Mod I  Bowel/Bladder  Mod I  Transfers  Mod I  Locomotion  Mod I  Communication     Cognition     Pain  < 4  Safety/Judgment  Mod I   Therapy Plan: PT Intensity: Minimum of 1-2 x/day ,45 to 90 minutes PT Frequency: 5 out of 7 days PT Duration Estimated Length of Stay: 7-10 days OT Intensity: Minimum of 1-2 x/day, 45 to 90 minutes OT Frequency: 5 out of 7 days OT Duration/Estimated Length of Stay: 7-10 days         Team Interventions: Nursing Interventions Patient/Family Education, Pain Management, Disease Management/Prevention, Medication Management, Skin Care/Wound Management, Discharge Planning  PT interventions Ambulation/gait training, DME/adaptive equipment instruction, Psychosocial support, UE/LE Strength taining/ROM, Balance/vestibular training, Skin care/wound management, UE/LE Coordination activities, Functional mobility training, Splinting/orthotics, Community reintegration, Neuromuscular re-education, IT trainer,  Wheelchair propulsion/positioning, Discharge planning, Pain management, Therapeutic Activities, Disease management/prevention, Patient/family education, Therapeutic Exercise  OT Interventions Functional mobility training, Self Care/advanced ADL retraining, Therapeutic Activities, Therapeutic Exercise, UE/LE Strength taining/ROM, DME/adaptive equipment instruction, Discharge planning, Balance/vestibular training  SLP Interventions    TR Interventions Adaptive equipment instruction, 1:1 session, Training and development officer, Functional mobility training, Community reintegration, Barrister's clerk education, Therapeutic activities, Recreation/leisure participation, Therapeutic exercise, UE/LE Coordination activities  SW/CM Interventions Discharge Planning, Psychosocial Support, Patient/Family Education    Team Discharge Planning: Destination: PT-Assisted Living (ILF) ,OT- Home , SLP-  Projected Follow-up: PT-Outpatient PT (tbd pending progress), OT-  None, SLP-  Projected Equipment Needs: PT-To be determined, OT- To be determined, SLP-  Equipment Details: PT-Pt already owns a SPC, RW (llate wife's), w/c (late wife's) - PT instructs pt to have family member bring in walker so PT can assess if it is appropriate height, OT-  Patient/family involved in discharge planning: PT- Patient,  OT-Patient, SLP-   MD ELOS: 7-10 days Medical Rehab Prognosis:  Excellent Assessment: The patient has been admitted for CIR therapies with the diagnosis of metastatic prostate to the thoracic spine with myelopathy. The team will be addressing functional mobility, strength, stamina, balance, safety, adaptive techniques and equipment, self-care, bowel and bladder mgt, patient and caregiver education, NMR, pain mgt, community reintegration, leisure awareness, . Goals have been set at mod I for transfers, locomotion, ADL's, self-care. Pt is quite motivated to participate in therapies. Meredith Staggers, MD,  [facility]      See Team Conference  Notes for weekly updates to the plan of care

## 2014-02-07 ENCOUNTER — Inpatient Hospital Stay (HOSPITAL_COMMUNITY): Payer: Medicare Other | Admitting: Occupational Therapy

## 2014-02-07 ENCOUNTER — Inpatient Hospital Stay (HOSPITAL_COMMUNITY): Payer: Medicare Other | Admitting: Physical Therapy

## 2014-02-07 ENCOUNTER — Inpatient Hospital Stay (HOSPITAL_COMMUNITY): Payer: Medicare Other | Admitting: *Deleted

## 2014-02-07 DIAGNOSIS — G9520 Unspecified cord compression: Secondary | ICD-10-CM

## 2014-02-07 LAB — GLUCOSE, CAPILLARY
GLUCOSE-CAPILLARY: 131 mg/dL — AB (ref 70–99)
Glucose-Capillary: 157 mg/dL — ABNORMAL HIGH (ref 70–99)
Glucose-Capillary: 173 mg/dL — ABNORMAL HIGH (ref 70–99)

## 2014-02-07 LAB — URINE CULTURE

## 2014-02-07 NOTE — Plan of Care (Signed)
Problem: SCI BOWEL ELIMINATION Goal: RH STG MANAGE BOWEL WITH ASSISTANCE STG Manage Bowel with mod I Assistance.  Outcome: Progressing Goal: RH STG SCI MANAGE BOWEL WITH MEDICATION WITH ASSISTANCE STG SCI Manage bowel with medication with Mod I assistance.  Outcome: Progressing  Problem: RH SKIN INTEGRITY Goal: RH STG ABLE TO PERFORM INCISION/WOUND CARE W/ASSISTANCE STG Able To Perform Incision/Wound Care With Total Assistance from caregiver.  Outcome: Progressing  Problem: RH PAIN MANAGEMENT Goal: RH STG PAIN MANAGED AT OR BELOW PT'S PAIN GOAL < 4  Outcome: Progressing

## 2014-02-07 NOTE — Progress Notes (Signed)
Patient ID: Clarence Pollard, male   DOB: Feb 02, 1941, 73 y.o.   MRN: [id number]   [facility] PHYSICAL MEDICINE & REHABILITATION     PROGRESS NOTE   02/07/14.  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   Past Medical History  Diagnosis Date  . History of radiation therapy 10/14/12-10/25/12    T7-T11   . Prostate cancer   . Metastatic cancer to spine      Intake/Output Summary (Last 24 hours) at 02/07/14 0909 Last data filed at 02/07/14 0800  Gross per 24 hour  Intake   1200 ml  Output   2525 ml  Net  -1325 ml    Patient Vitals for the past 24 hrs:  BP Temp Temp src Pulse Resp SpO2  02/07/14 0500 (!) 122/54 mmHg 97.7 F (36.5 C) Oral 79 17 98 %  02/06/14 1458 132/75 mmHg 98.1 F (36.7 C) Oral 80 16 99 %    Objective: Vital Signs: Blood pressure 122/54, pulse 79, temperature 97.7 F (36.5 C), temperature source Oral, resp. rate 17, height 6' (1.829 m), SpO2 98 %. No results found.  Recent Labs  02/05/14 0501  WBC 14.2*  HGB 10.8*  HCT 35.3*  PLT 216    Recent Labs  02/05/14 0501 02/05/14 1024  NA 137 137  K 5.6* 5.8*  CL 96 97  GLUCOSE 124* 124*  BUN 28* 29*  CREATININE 0.74 0.66  CALCIUM 7.9* 8.0*   CBG (last 3)   Recent Labs  02/06/14 1647 02/06/14 2131 02/07/14 0710  GLUCAP 177* 136* 131*    Wt Readings from Last 3 Encounters:  01/27/14 256 lb 6.3 oz (116.3 kg)  01/26/14 266 lb (120.657 kg)  01/26/14 266 lb (120.657 kg)    Physical Exam:  Constitutional: He is oriented to person, place, and time. He appears well-developed and well-nourished.  HENT: dentition fair,  Head: Normocephalic and atraumatic.  Eyes: Conjunctivae are normal. Pupils are equal, round, and reactive to light.  Neck: Normal range of  motion. Neck supple.  Cardiovascular: Normal rate and regular rhythm.  Respiratory: Effort normal and breath sounds normal. No respiratory distress. He has no wheezes.  GI: Soft. Bowel sounds are normal.  Neurological: He is alert and oriented to person, place, and time.   Decrease in fine motor movements R>LLE. Mild sensory deficits BLE  Decreased proprioception in feet especially.    UE's 4/5. LE: 4- hf, 4 ke and 4/5 adf/apf. Cognitively intact  Skin: Skin is warm and dry. Back incision clean.  Psychiatric: He has a normal mood and affect. His behavior is normal. Judgment and thought content normal  Assessment/Plan: 1. 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 DVT Prophylaxis/Anticoagulation: SCD's, gait/mobility  3. Steroid induced hyperglycemia: Will continue CBG checks for now and use SSI for elevated BS    LOS (Days) 3 A FACE TO FACE EVALUATION WAS PERFORMED  Nyoka Cowden 02/07/2014 9:07 AM

## 2014-02-07 NOTE — Progress Notes (Signed)
Social Work  Social Work Assessment and Plan  Patient Details  Name: Clarence Pollard MRN: [id number] Date of Birth: 30-Jan-1941  Today's Date: 02/07/2014  Problem List:  Patient Active Problem List   Diagnosis Date Noted  . Hypokalemia 02/06/2014  . Prostate cancer metastatic to bone 02/04/2014  . Paraparesis of both lower limbs 02/04/2014  . Spine metastasis 01/31/2014  . Spinal cord tumor   . Insomnia   . Lower extremity weakness   . Spinal cord compression 01/26/2014  . 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 Malignant neoplasm of prostate 07/25/2013  . Chronic cholecystitis with calculus 10/08/2012   Past Medical History:  Past Medical History  Diagnosis Date  . History of radiation therapy 10/14/12-10/25/12    T7-T11   . Prostate cancer   . Metastatic cancer to spine    Past Surgical History:  Past Surgical History  Procedure Laterality Date  . Appendectomy    . Colon surgery    . Cholecystectomy    . Cataract extraction Bilateral   . Laminectomy N/A 01/29/2014    Procedure: Thoracic laminectomy for tumor resection;  Surgeon: Hosie Spangle, MD;  Location: [facility];  Service: Neurosurgery;  Laterality: N/A;  Thoracic laminectomy for tumor resection   Social History:  reports that he has quit smoking. He does not have any smokeless tobacco history on file. He reports that he does not drink alcohol or use illicit drugs.  Family / Support Systems Marital Status: Widow/Widower How Long?: one year Patient Roles: Parent Children: step-daughter, Clarence Pollard @ ([phone] or [phone] plus two other step - daughters but Clarence Pollard only one living locally Anticipated Caregiver: pt plans to either d/c home or to [place] depending on his recovery and how soon he can go to senior living Ability/Limitations of Caregiver: daughter works Careers adviser: Intermittent Family Dynamics: pt describes very close relationship with all of his  step-daughters, reporting "they all treat me like I'm their real Dad.... and they promised their mother that they'd always take care of me."  Social History Preferred language: English Religion: Presbyterian Cultural Background: NA Education: HS Read: Yes Write: Yes Employment Status: Retired Freight forwarder Issues: None Guardian/Conservator: None -per MD, pt capable of making decisions on his own behalf   Abuse/Neglect Physical Abuse: Denies Verbal Abuse: Denies Sexual Abuse: Denies Exploitation of patient/patient's resources: Denies Self-Neglect: Denies  Emotional Status Pt's affect, behavior adn adjustment status: Pt very pleasant, talkative and speak affectionately and appreciatively about his step-daughters.  Very matter of fact about his diagnosis and of plans to move into senior living apartment.  "I'm actually looking forward to it.  It gets lonely at home by myself." Recent Psychosocial Issues: Wife died last year after battle with cancer. Pyschiatric History: None Substance Abuse History: none  Patient / Family Perceptions, Expectations & Goals Pt/Family understanding of illness & functional limitations: Pt and family with good understanding of his cancer mets to spine, surgery performed and current functional limitations.  All with good awareness of anticipated treatment plans after hospital d/c. Premorbid pt/family roles/activities: Pt living alone and managing fairly well until the past few months.  Daughter, Clarence Pollard, providing frequent assistance. Anticipated changes in roles/activities/participation: Pt now plans to move into a senior apartment. Pt/family expectations/goals: "I want to get as strong as I can."  [employer]: Other (Comment) ([place]) Premorbid Home Care/DME Agencies: None Transportation available at discharge: yes  Discharge Planning Living Arrangements: Alone Support  Systems: Children Type of  Residence: Private residence Insurance Resources: [employer] Financial Resources: [employer] Referred: No Living Expenses: Own Money Management: Family Does the patient have any problems obtaining your medications?: No Home Management: pt with assist from daughter Patient/Family Preliminary Plans: Pt and daughter hope to be able to d/c directly into [facility] from [place].  Will assist as I can with this. Social Work Anticipated Follow Up Needs: HH/OP Expected length of stay: ELOS 9 to 14 days  Clinical Impression Very pleasant, elderly gentleman here following surgery for spinal mets/ tumor removal.  Very supportive step-daughters who have been assisting with arrangements for pt to move into [facility] ALF following CIR.  Pt pleased with progress so far.  Denies any emotional distress - will monitor and provide support and d/c planning assistance.  Clarence Pollard 02/07/2014, 3:09 PM

## 2014-02-07 NOTE — Progress Notes (Signed)
Physical Therapy Session Note  Patient Details  Name: Clarence Pollard MRN: [id number] Date of Birth: 01/25/1941  Today's Date: 02/07/2014 PT Individual Time: 1035-1120 Treatment Session 2: 1300-1330 PT Individual Time Calculation (min): 45 min  Treatment Session 2: 30 min  Short Term Goals: Week 1:  PT Short Term Goal 1 (Week 1): STGs = LTGs due to ELOS  Skilled Therapeutic Interventions/Progress Updates:    Therapeutic Activity: PT instructs pt in recliner to bed transfer with RW req CGA - no LOB req assist to correct.  PT instructs pt in sit to R side lie transfer req mod A for B LEs. PT instructs pt in logrolling in bed and pt continues to twist until he places his hand on his knee and then he is able to demonstrate 5 log rolls req SBA only, each direction.  PT instructs pt in side lie to sit transfer in bed req SBA and verbal cues to follow spinal precautions.  PT instructs pt in car transfer with RW req CGA and verbal cues to follow spinal precautions.   Therapeutic Exercise: PT instructs pt in side lie hip abduction 3 x 10 reps to B LE.   Gait Training: PT instructs pt in ambulation with RW req CGA-min A for balance x 200' x 4 reps.   Pt appears lackadaisical about following spinal precautions with functional mobility. PT educates pt that if he does not take this seriously, then his spinal surgery can fail and he can have a worse injury to his spinal cord. Pt appears to understand the gravity of the situation and works hard to try to remember to follow spinal precautions for the rest of the session.    Treatment Session 2: Neuromuscular Reeducation: PT instructs pt in TUG x 3 reps and pt scores: 30 seconds, 28 seconds, and 29 seconds. Pt demonstrates 1 LOB during final sit to stand and begins to cross legs when turning.   Therapeutic Exercise: PT instructs pt in standing strengthening exercises: squats, heel raises, high knee marching: 2 x 10 reps each  Pt is very motivated  to work hard this session. He continues to have difficulty following spinal precautions during functional mobility - PT catches pt twisting while reaching for an item on his bed while sitting in a recliner. Continue per PT POC.   Therapy Documentation Precautions:  Precautions Precautions: Fall, Back Precaution Booklet Issued: No Restrictions Weight Bearing Restrictions: No Pain: Pain Assessment Pain Assessment: 0-10 Pain Score: 3  Pain Type: Surgical pain Pain Location: Back Pain Orientation: Mid Pain Descriptors / Indicators: Pressure Pain Onset: On-going Pain Intervention(s): Rest;Repositioned Multiple Pain Sites: No  Treatment Session 2: Pt denies pain during 2nd session.    Balance: Balance Balance Assessed: Yes Standardized Balance Assessment Standardized Balance Assessment: Timed Up and Go Test Timed Up and Go Test TUG: Normal TUG Normal TUG (seconds): 28  See FIM for current functional status  Therapy/Group: Individual Therapy  Atalya Dano M 02/07/2014, 10:42 AM

## 2014-02-07 NOTE — Progress Notes (Signed)
Occupational Therapy Session Note  Patient Details  Name: DAIMEN SHOVLIN MRN: [id number] Date of Birth: October 04, 1940  Today's Date: 02/07/2014 OT Individual Time:  - 1400-1500  (60 min)      Short Term Goals: Week 1:  OT Short Term Goal 1 (Week 1): STG=LTG d/t short ELOS  Skilled Therapeutic Interventions/Progress Updates:    Engaged in TAG group with focus on mobility, balance.  Pt. Went from sit to stand x10 with moderate cues for eccentric control.  He ambulated around the day room for 50 feet x2.  He had episode of urine incontinence while in day room.  Pt taken back to room and changed gowns and left in recliner.    Therapy Documentation Precautions:  Precautions Precautions: Fall, Back Precaution Booklet Issued: No Restrictions Weight Bearing Restrictions: No    Pain:  none    ADL: ADL ADL Comments: see FIM    Other Treatments:    See FIM for current functional status  Therapy/Group: Group Therapy  Lisa Roca 02/07/2014, 6:18 PM

## 2014-02-07 NOTE — Progress Notes (Signed)
Pt. Refuses CPAP at this time. Pt. Was made aware to inform RT or RN anytime during the night if he changed his mind & decided to wear CPAP.

## 2014-02-07 NOTE — Progress Notes (Signed)
Occupational Therapy Session Note  Patient Details  Name: Clarence Pollard MRN: [id number] Date of Birth: 1940/08/20  Today's Date: 02/07/2014 OT Individual Time: 8185-6314 OT Individual Time Calculation (min): 45 min    Short Term Goals: Week 1:  OT Short Term Goal 1 (Week 1): STG=LTG d/t short ELOS  Skilled Therapeutic Interventions/Progress Updates:  Upon entering the room, pt seated in recliner chair awaiting therapist arrival. Pt verbalized precautions with 100% accuracy this session. Pt requiring Min A for STS and ambulated into bathroom with use of RW and steady assist. Supervision for transfer onto TTB with use of LH reacher to remove socks for B feet. Pt performed all bathing with supervision. Pt did not have clean clothing this session so hospital gown donned this session. Pt ambulated to sink for grooming with set up A while seated secondary to fatigue. Once all ADL tasks completed pt returned to recliner chair with call bell and all needed items within reach upon exiting the room.   Therapy Documentation Precautions:  Precautions Precautions: Fall, Back Precaution Booklet Issued: No Restrictions Weight Bearing Restrictions: No   ADL ADL Comments: see FIM  See FIM for current functional status  Therapy/Group: Individual Therapy  Clarence Pollard 02/07/2014, 10:56 AM

## 2014-02-08 ENCOUNTER — Inpatient Hospital Stay (HOSPITAL_COMMUNITY): Payer: Medicare Other | Admitting: Occupational Therapy

## 2014-02-08 LAB — GLUCOSE, CAPILLARY
GLUCOSE-CAPILLARY: 138 mg/dL — AB (ref 70–99)
Glucose-Capillary: 180 mg/dL — ABNORMAL HIGH (ref 70–99)
Glucose-Capillary: 172 mg/dL — ABNORMAL HIGH (ref 70–99)
Glucose-Capillary: 182 mg/dL — ABNORMAL HIGH (ref 70–99)

## 2014-02-08 MED ORDER — CIPROFLOXACIN HCL 500 MG PO TABS
500.0000 mg | ORAL_TABLET | Freq: Two times a day (BID) | ORAL | Status: DC
  Administered 2014-02-08 – 2014-02-09 (×3): 500 mg via ORAL
  Filled 2014-02-08 (×5): qty 1

## 2014-02-08 NOTE — Progress Notes (Signed)
Patient ID: JOURDEN GILSON, male   DOB: 1940-04-20, 73 y.o.   MRN: [id number]  Patient ID: VIYAN ROSAMOND, male   DOB: 03/16/1941, 73 y.o.   MRN: [id number]   [place] [facility]     PROGRESS NOTE   02/08/14.  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 Urine C&S has revealed a proteus species Sensitive  Cipro.  No voiding c/os   Past Medical History  Diagnosis Date  . History of radiation therapy 10/14/12-10/25/12    T7-T11   . Prostate cancer   . Metastatic cancer to spine      Intake/Output Summary (Last 24 hours) at 02/08/14 0830 Last data filed at 02/08/14 0515  Gross per 24 hour  Intake    960 ml  Output   3125 ml  Net  -2165 ml    Patient Vitals for the past 24 hrs:  BP Temp Temp src Pulse Resp SpO2  02/08/14 0528 134/79 mmHg 97.7 F (36.5 C) Oral 68 17 100 %  02/07/14 1517 113/82 mmHg 98.5 F (36.9 C) Oral 87 17 99 %    Objective: Vital Signs: Blood pressure 134/79, pulse 68, temperature 97.7 F (36.5 C), temperature source Oral, resp. rate 17, height 6' (1.829 m), SpO2 100 %. No results found. No results for input(s): WBC, HGB, HCT, PLT in the last 72 hours.  Recent Labs  02/05/14 1024  NA 137  K 5.8*  CL 97  GLUCOSE 124*  BUN 29*  CREATININE 0.66  CALCIUM 8.0*   CBG (last 3)   Recent Labs  02/07/14 1631 02/07/14 2115 02/08/14 0649  GLUCAP 157* 173* 138*    Wt Readings from Last 3 Encounters:  01/27/14 256 lb 6.3 oz (116.3 kg)  01/26/14 266 lb (120.657 kg)  01/26/14 266 lb (120.657 kg)    Physical Exam:  Constitutional: He is oriented to person, place, and time. He appears well-developed and well-nourished.  HENT: dentition fair,  Head: Normocephalic and atraumatic.  Eyes:  Conjunctivae are normal. Pupils are equal, round, and reactive to light.  Neck: Normal range of motion. Neck supple.  Cardiovascular: Normal rate and regular rhythm.  Respiratory: Effort normal and breath sounds normal. No respiratory distress. He has no wheezes.  GI: Soft. Bowel sounds are normal.  Neurological: He is alert and oriented to person, place, and time.   Decrease in fine motor movements R>LLE. Mild sensory deficits BLE  Decreased proprioception in feet especially.    UE's 4/5. LE: 4- hf, 4 ke and 4/5 adf/apf. Cognitively intact  Skin: Skin is warm and dry. Back incision clean.  Psychiatric: He has a normal mood and affect. His behavior is normal. Judgment and thought content normal  Assessment/Plan: 1. 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 DVT Prophylaxis/Anticoagulation: SCD's, gait/mobility  3. Steroid induced hyperglycemia: Will continue CBG checks for now and use SSI for elevated BS  4. Proteus UTI- will treat with Cipro for 3 days    LOS (Days) 4 A FACE TO FACE EVALUATION WAS PERFORMED  Nyoka Cowden 02/08/2014 8:30 AM

## 2014-02-08 NOTE — Progress Notes (Signed)
Occupational Therapy Session Note  Patient Details  Name: Clarence Pollard MRN: [id number] Date of Birth: Jul 26, 1940  Today's Date: 02/08/2014 OT Individual Time: 1345-1430 OT Individual Time Calculation (min): 45 min    Short Term Goals: Week 1:  OT Short Term Goal 1 (Week 1): STG=LTG d/t short ELOS  Skilled Therapeutic Interventions/Progress Updates:    Patient seen for occupational therapy this afternoon with emphasis on functional mobility, therapeutic activity tolerance, standing tolerance, standing balance, ADL retraining and instruction, use of AE, and postural control.  Patient upright in wheelchair upon therapist arrival with gown and socks donned. Patient able to recall 3/3 spinal precautions.  Min verbal cues for adherence with use of AE (reacher) for LB dressing.  Patient reported he had a bath with RN this morning.  Agreeable to OT.  Patient completes UB dressing with setup and LB dressing with mod assist to don shoes secondary to need for long handled shoe horn.  Patient dependent for knee length TED hose.  Patient sit to/from stand with SBA to RW to pull clothing up over B hips.  Patient ambulates from hospital room to therapy gym without rest breaks with CGA for improved activity tolerance, balance and functional mobility.  Patient completes stand to sit and therapy mat and requests to use the bathroom.  Patient ambulates from therapy gym to ADL apartment bathroom to access toilet.  Completes toileting with (+) bowel movement and void of urine.  Patient completes hygiene seated with supervision.  Patient reports he has a BSC, shower seat, RW, rollator, and W/C at home and would be possibly moving to ILF upon discharge.  Patient completes grooming standing at sink with min verbal cues for adherence to upright posture while completing handwashing.  Patient completes stand step ambulation from ADL apartment to hospital room with increased time and standing rest breaks x 3.  Patient  requires CGA for all ambulation.    Therapy Documentation Precautions:  Precautions Precautions: Fall, Back Precaution Booklet Issued: No Restrictions Weight Bearing Restrictions: No Vital Signs: Therapy Vitals Temp: 98.3 F (36.8 C) Temp Source: Oral Pulse Rate: 88 Resp: 18 BP: 135/63 mmHg Patient Position (if appropriate): Sitting Oxygen Therapy SpO2: 98 % O2 Device: Not Delivered Pain:  Patient denies pain. ADL: ADL ADL Comments: see FIM  See FIM for current functional status  Therapy/Group: Individual Therapy  Osa Craver 02/08/2014, 3:19 PM

## 2014-02-09 ENCOUNTER — Inpatient Hospital Stay (HOSPITAL_COMMUNITY): Payer: Medicare Other

## 2014-02-09 LAB — GLUCOSE, CAPILLARY
GLUCOSE-CAPILLARY: 163 mg/dL — AB (ref 70–99)
Glucose-Capillary: 149 mg/dL — ABNORMAL HIGH (ref 70–99)
Glucose-Capillary: 139 mg/dL — ABNORMAL HIGH (ref 70–99)
Glucose-Capillary: 127 mg/dL — ABNORMAL HIGH (ref 70–99)

## 2014-02-09 MED ORDER — TUBERCULIN PPD 5 UNIT/0.1ML ID SOLN
5.0000 [IU] | Freq: Once | INTRADERMAL | Status: AC
  Administered 2014-02-09: 5 [IU] via INTRADERMAL
  Filled 2014-02-09: qty 0.1

## 2014-02-09 MED ORDER — CIPROFLOXACIN HCL 500 MG PO TABS
500.0000 mg | ORAL_TABLET | Freq: Two times a day (BID) | ORAL | Status: AC
  Administered 2014-02-09 – 2014-02-10 (×2): 500 mg via ORAL
  Filled 2014-02-09 (×3): qty 1

## 2014-02-09 NOTE — Progress Notes (Signed)
Physical Therapy Session Note  Patient Details  Name: Clarence Pollard MRN: [id number] Date of Birth: 30-Mar-1940  Today's Date: 02/09/2014 PT Individual Time: 0810-0900 PT Individual Time Calculation (min): 50 min   Short Term Goals: Week 1:  PT Short Term Goal 1 (Week 1): STGs = LTGs due to ELOS  Skilled Therapeutic Interventions/Progress Updates:    Pt received seated EOB, agreeable to participate in therapy after finishing breakfast. Pt allowed 10 minutes to finish breakfast. Upon return, pt dressed UB w/ setup assist to obtain clothing, dressed LB w/ MinA and use of AE. Pt ambulated 150' to/from room w/ RW and MinGuard. In rehab gym worked on standing balance w/ 2 UE support initially, progressing to 0-1 UE support w/ weight shifting, small marches. CGA-MinA to maintain standing balance. Blocked practice x10 sit<>stands from mat table at various heights to simulate standing from surfaces in ILF where pt plans to go at discharge. In ADL apartment pt moved sit<>stand and supine<>sit while maintaining back precautions w/ SBA and min cueing for sequencing and form. Pt left seated in recliner w/ all needs within reach.   Therapy Documentation Precautions:  Precautions Precautions: Fall, Back Precaution Booklet Issued: No Restrictions Weight Bearing Restrictions: No Pain: Pain Assessment Pain Assessment: No/denies pain   See FIM for current functional status  Therapy/Group: Individual Therapy  Rada Hay 02/09/2014, 11:30 AM

## 2014-02-09 NOTE — Progress Notes (Signed)
Occupational Therapy Session Note  Patient Details  Name: Clarence Pollard MRN: [id number] Date of Birth: September 23, 1940  Today's Date: 02/09/2014 OT Individual Time: 1030-1130 OT Individual Time Calculation (min): 60 min    Short Term Goals: Week 1:  OT Short Term Goal 1 (Week 1): STG=LTG d/t short ELOS  Skilled Therapeutic Interventions/Progress Updates: ADL-retraining with focus on pt ed on skilled use of AE during ADL to maintain back precautions, dynamic standing balance, and functional mobility using RW.  Pt received seated in recliner with his pants half down.   Pt reported difficulty with use of urinal to include spilling urine occassionally.    Pt receptive for bathing and completed sit>stand to pull up pants before ambulating to bathroom.   Pt completed transfer and used reacher to undress lower body with setup to retrieve reacher after vc to use device to maintain back precautions.   Pt completed seated shower, using LH sponge for his feet and back and standing supported using grab bar to wash buttocks.   Pt required setup assist to don TEDs and min assist to don left shoe using LH shoe horn (provided this date).   Pt is able to use reacher independently to loop shoelace over shoe post and returned to w/c to sit at sink to groom at end of session.   Pt demo's good awareness of balance deficits throughout task with improved skill using AE as instructed.           Therapy Documentation Precautions:  Precautions Precautions: Fall, Back Precaution Booklet Issued: No Restrictions Weight Bearing Restrictions: No  Vital Signs: Therapy Vitals Temp: 97.9 F (36.6 C) Temp Source: Oral Pulse Rate: 86 Resp: 18 BP: 130/73 mmHg Patient Position (if appropriate): Sitting Oxygen Therapy SpO2: 98 % O2 Device: Not Delivered  Pain: No/denies pain    ADL: ADL ADL Comments: see FIM    See FIM for current functional status  Therapy/Group: Individual  Therapy  Kayston Jodoin 02/09/2014, 2:55 PM

## 2014-02-09 NOTE — Progress Notes (Signed)
Administered TB skin test per MD order in left forearm.  Circled area with permanent marker for easier identification.  Placed result sheet in patients paper chart.  Brita Romp, RN

## 2014-02-09 NOTE — Progress Notes (Signed)
Visited pts room offered cpap.  Pt advised me he dont wear.  I told pt if he decides to .Marland Kitchen Feel free to call during the night.

## 2014-02-09 NOTE — Progress Notes (Signed)
Patient does not want to wear CPAP 

## 2014-02-09 NOTE — Progress Notes (Signed)
Called radiation oncology back regarding appointments set for tomorrow--CT myelogram could be done at [facility] and we can set transportation to [facility] for simulation. Nurse to discuss with Dr. Lisbeth Renshaw and call back.

## 2014-02-09 NOTE — Progress Notes (Signed)
Occupational Therapy Note  Patient Details  Name: DAYQUAN BUYS MRN: [id number] Date of Birth: [birth year]  Today's Date: 02/09/2014 OT Individual Time: 1300-1400 OT Individual Time Calculation (min): 60 min   Pt denied pain Individual Therapy  Pt resting in recliner upon arrival.  Pt agreeable to participating in therapy and amb with RW from room to ADL apartment.  Pt engaged in functional amb with RW for home mgmt tasks including gathering supplies/clothing from variety of surfaces.  Pt educated on RW safety and AE use to assist with home mgmt tasks.  Pt recalled 3/3 back precautions and followed precautions throughout session.  Continued discharge planning to [place].  Focus on activity tolerance, functional amb with RW, dynamic standing balance, sit<>stand from various surfaces, bed mobility, and safety awareness.   Leotis Shames [facility] 02/09/2014, 3:24 PM

## 2014-02-09 NOTE — Plan of Care (Signed)
Problem: SCI BOWEL ELIMINATION Goal: RH STG MANAGE BOWEL WITH ASSISTANCE STG Manage Bowel with mod I Assistance.  Outcome: Progressing Goal: RH STG SCI MANAGE BOWEL WITH MEDICATION WITH ASSISTANCE STG SCI Manage bowel with medication with Mod I assistance.  Outcome: Progressing  Problem: RH SKIN INTEGRITY Goal: RH STG ABLE TO PERFORM INCISION/WOUND CARE W/ASSISTANCE STG Able To Perform Incision/Wound Care With Total Assistance from caregiver.  Outcome: Progressing  Problem: RH PAIN MANAGEMENT Goal: RH STG PAIN MANAGED AT OR BELOW PT'S PAIN GOAL < 4  Outcome: Progressing

## 2014-02-09 NOTE — Progress Notes (Signed)
[place] PHYSICAL MEDICINE & REHABILITATION     PROGRESS NOTE    Subjective/Complaints: No new complaints. Had an uneventful night. Feet are feeling better.  No sob, cough Objective: Vital Signs: Blood pressure 118/70, pulse 71, temperature 97.9 F (36.6 C), temperature source Oral, resp. rate 18, height 6' (1.829 m), SpO2 99 %. No results found. No results for input(s): WBC, HGB, HCT, PLT in the last 72 hours. No results for input(s): NA, K, CL, GLUCOSE, BUN, CREATININE, CALCIUM in the last 72 hours.  Invalid input(s): CO CBG (last 3)   Recent Labs  02/08/14 1652 02/08/14 2054 02/09/14 0640  GLUCAP 172* 182* 149*    Wt Readings from Last 3 Encounters:  01/27/14 116.3 kg (256 lb 6.3 oz)  01/26/14 120.657 kg (266 lb)  01/26/14 120.657 kg (266 lb)    Physical Exam:  Constitutional: He is oriented to person, place, and time. He appears well-developed and well-nourished.  HENT: dentition fair,  Head: Normocephalic and atraumatic.  Eyes: Conjunctivae are normal. Pupils are equal, round, and reactive to light.  Neck: Normal range of motion. Neck supple.  Cardiovascular: Normal rate and regular rhythm.  Respiratory: Effort normal and breath sounds normal. No respiratory distress. He has no wheezes.  GI: Soft. Bowel sounds are normal.  Neurological: He is alert and oriented to person, place, and time.   Decrease in fine motor movements R>LLE. Mild sensory deficits BLE  Improved proprioception in feet especially.  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   UE's 4/5. LE: 4- hf, 4 ke and 4/5 adf/apf. Cognitively intact  Skin: Skin is warm and dry. Back incision clean. Staples out Psychiatric: He has a normal mood and affect. His behavior is normal. Judgment and thought content normal  Assessment/Plan: 1. 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 Physiatrist is providing close team supervision and 24 hour management of active medical problems listed below. Physiatrist and rehab team continue to assess barriers to discharge/monitor patient progress toward functional and medical goals.  FIM: FIM - Bathing Bathing Steps Patient Completed: Chest, Right Arm, Left Arm, Abdomen, Front perineal area, Buttocks, Right upper leg, Left upper leg, Right lower leg (including foot), Left lower leg (including foot) Bathing: 6: More than reasonable amount of time  FIM - Upper Body Dressing/Undressing Upper body dressing/undressing steps patient completed: Thread/unthread right sleeve of pullover shirt/dresss, Put head through opening of pull over shirt/dress, Thread/unthread left sleeve of pullover shirt/dress, Pull shirt over trunk Upper body dressing/undressing: 0: Activity did not occur FIM - Lower Body Dressing/Undressing Lower body dressing/undressing steps patient completed: Thread/unthread right underwear leg, Thread/unthread left underwear leg, Pull underwear up/down, Thread/unthread right pants leg, Thread/unthread left pants leg, Pull pants up/down, Don/Doff right sock Lower body dressing/undressing: 0: Activity did not occur  FIM - Toileting Toileting steps completed by patient: Adjust clothing prior to toileting Toileting Assistive Devices: Grab bar or rail for support Toileting: 1: Total-Patient completed zero steps, helper did all 3  FIM - Radio producer Devices: Grab bars, Insurance account manager Transfers: 4-To toilet/BSC: Min A (steadying Pt. > 75%), 4-From toilet/BSC: Min A (steadying Pt. > 75%)  FIM - Bed/Chair Transfer Bed/Chair Transfer Assistive Devices: Walker, Arm rests Bed/Chair Transfer: 4: Chair or W/C > Bed: Min A (steadying Pt. > 75%), 3: Sit > Supine: Mod A (lifting assist/Pt. 50-74%/lift 2 legs), 4: Bed > Chair or  W/C: Min A (steadying Pt. > 75%), 5: Supine >  Sit: Supervision (verbal cues/safety issues)  FIM - Locomotion: Wheelchair Distance: 150 Locomotion: Wheelchair: 0: Activity did not occur FIM - Locomotion: Ambulation Locomotion: Ambulation Assistive Devices: Administrator Ambulation/Gait Assistance: 4: Min assist Locomotion: Ambulation: 4: Travels 150 ft or more with minimal assistance (Pt.>75%)  Comprehension Comprehension Mode: Auditory Comprehension: 5-Follows basic conversation/direction: With extra time/assistive device  Expression Expression Mode: Verbal Expression: 5-Expresses basic needs/ideas: With extra time/assistive device  Social Interaction Social Interaction: 6-Interacts appropriately with others with medication or extra time (anti-anxiety, antidepressant).  Problem Solving Problem Solving: 5-Solves basic 90% of the time/requires cueing < 10% of the time  Memory Memory: 5-Recognizes or recalls 90% of the time/requires cueing < 10% of the time   Medical Problem List and Plan: 1. 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 DVT Prophylaxis/Anticoagulation: SCD's, gait/mobility 3. Pain Management: N/A--denies any discomfort.  4. Mood: LCSW to follow for evaluation and support.  5. Neuropsych: This patient is capable of making decisions on his own behalf. 6. Skin/Wound Care: Routine pressure relief measures. Monitor wound for Healing.  7. Fluids/Electrolytes/Nutrition: Will monitor I/O. Offer nutritional supplements prn poor intake. Acute renal insufficiency resolving.   -k+ likely hemolyzed again  -recheck in am 8. Reactive leucocytosis: Likely due to surgery as well as steroids.  decadron taper   9. Acute on chronic anemia: Continue iron supplement.  10. Steroid induced hyperglycemia: SSI for elevated BS. improving    LOS (Days) 5 A FACE TO FACE EVALUATION WAS PERFORMED  Clarence Pollard 02/09/2014 8:19  AM

## 2014-02-10 ENCOUNTER — Inpatient Hospital Stay (HOSPITAL_COMMUNITY): Payer: Medicare Other

## 2014-02-10 ENCOUNTER — Inpatient Hospital Stay: Admit: 2014-02-10 | Payer: Medicare Other

## 2014-02-10 ENCOUNTER — Ambulatory Visit: Payer: Medicare Other

## 2014-02-10 ENCOUNTER — Ambulatory Visit
Admit: 2014-02-10 | Discharge: 2014-02-10 | Disposition: A | Discharge status: Home / Self Care | Payer: Medicare Other | Source: Ambulatory Visit | Attending: Radiation Oncology | Admitting: Radiation Oncology

## 2014-02-10 ENCOUNTER — Other Ambulatory Visit: Payer: Medicare Other

## 2014-02-10 ENCOUNTER — Telehealth: Payer: Self-pay | Admitting: *Deleted

## 2014-02-10 DIAGNOSIS — C7951 Secondary malignant neoplasm of bone: Secondary | ICD-10-CM

## 2014-02-10 DIAGNOSIS — Z510 Encounter for antineoplastic radiation therapy: Secondary | ICD-10-CM | POA: Diagnosis not present

## 2014-02-10 DIAGNOSIS — C7931 Secondary malignant neoplasm of brain: Secondary | ICD-10-CM | POA: Insufficient documentation

## 2014-02-10 DIAGNOSIS — C61 Malignant neoplasm of prostate: Secondary | ICD-10-CM | POA: Insufficient documentation

## 2014-02-10 LAB — BASIC METABOLIC PANEL
ANION GAP: 14 (ref 5–15)
BUN: 37 mg/dL — ABNORMAL HIGH (ref 6–23)
CHLORIDE: 95 meq/L — AB (ref 96–112)
CO2: 27 mEq/L (ref 19–32)
Calcium: 8.4 mg/dL (ref 8.4–10.5)
Creatinine, Ser: 0.83 mg/dL (ref 0.50–1.35)
GFR calc Af Amer: >90 mL/min (ref >90)
GFR, EST NON AFRICAN AMERICAN: 86 mL/min — AB (ref >90)
Glucose, Bld: 135 mg/dL — ABNORMAL HIGH (ref 70–99)
Potassium: 5 mEq/L (ref 3.7–5.3)
Sodium: 136 mEq/L — ABNORMAL LOW (ref 137–147)

## 2014-02-10 LAB — GLUCOSE, CAPILLARY
Glucose-Capillary: 120 mg/dL — ABNORMAL HIGH (ref 70–99)
Glucose-Capillary: 143 mg/dL — ABNORMAL HIGH (ref 70–99)
Glucose-Capillary: 116 mg/dL — ABNORMAL HIGH (ref 70–99)

## 2014-02-10 MED ORDER — IOHEXOL 300 MG/ML  SOLN
10.0000 mL | Freq: Once | INTRAMUSCULAR | Status: AC | PRN
  Administered 2014-02-10: 10 mL via INTRATHECAL

## 2014-02-10 NOTE — Progress Notes (Signed)
O2 sats checked at 2124-98% RA, at 2324-99% RA, at 0120-92% RA. Patient without complaint of.[name]

## 2014-02-10 NOTE — Plan of Care (Signed)
Problem: RH Simple Meal Prep Goal: LTG Patient will perform simple meal prep w/assist (OT) LTG: Patient will perform simple meal prep with assistance, with/without cues (OT).  Outcome: Not Applicable Date Met:  77/41/28  Problem: RH Laundry Goal: LTG Patient will perform laundry w/assist, cues (OT) LTG: Patient will perform laundry with assistance, with/without cues (OT).  Outcome: Not Applicable Date Met:  78/67/67  Comments:  Goals discontinued d/t planned d/c to ALF - FB

## 2014-02-10 NOTE — Progress Notes (Addendum)
[place] PHYSICAL MEDICINE & REHABILITATION     PROGRESS NOTE    Subjective/Complaints: Getting stronger. Pleased with progress. Aware that he's going to [place] today No sob, cough Objective: Vital Signs: Blood pressure 116/67, pulse 80, temperature 97.5 F (36.4 C), temperature source Oral, resp. rate 18, height 6' (1.829 m), SpO2 96 %. No results found. No results for input(s): WBC, HGB, HCT, PLT in the last 72 hours.  Recent Labs  02/10/14 0516  NA 136*  K 5.0  CL 95*  GLUCOSE 135*  BUN 37*  CREATININE 0.83  CALCIUM 8.4   CBG (last 3)   Recent Labs  02/09/14 1632 02/09/14 2113 02/10/14 0632  GLUCAP 163* 127* 120*    Wt Readings from Last 3 Encounters:  01/27/14 116.3 kg (256 lb 6.3 oz)  01/26/14 120.657 kg (266 lb)  01/26/14 120.657 kg (266 lb)    Physical Exam:  Constitutional: He is oriented to person, place, and time. He appears well-developed and well-nourished.  HENT: dentition fair,  Head: Normocephalic and atraumatic.  Eyes: Conjunctivae are normal. Pupils are equal, round, and reactive to light.  Neck: Normal range of motion. Neck supple.  Cardiovascular: Normal rate and regular rhythm.  Respiratory: Effort normal and breath sounds normal. No respiratory distress. He has no wheezes.  GI: Soft. Bowel sounds are normal.  Neurological: He is alert and oriented to person, place, and time.   Decrease in fine motor movements R>LLE. Mild sensory deficits BLE  Improved proprioception in feet especially.  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   UE's 4/5. LE: 4- hf, 4 ke and 4/5 adf/apf. Cognitively intact  Skin: Skin is warm and dry. Back incision clean. Staples out Psychiatric: He has a normal mood and affect. His behavior is normal. Judgment and thought content normal  Assessment/Plan: 1. 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 Physiatrist is providing close team supervision and 24 hour management of active medical problems listed below. Physiatrist and rehab team continue to assess barriers to discharge/monitor patient progress toward functional and medical goals.  FIM: FIM - Bathing Bathing Steps Patient Completed: Chest, Right Arm, Left Arm, Abdomen, Front perineal area, Buttocks, Right upper leg, Left upper leg, Right lower leg (including foot), Left lower leg (including foot) Bathing: 5: Supervision: Safety issues/verbal cues  FIM - Upper Body Dressing/Undressing Upper body dressing/undressing steps patient completed: Thread/unthread right sleeve of pullover shirt/dresss, Thread/unthread left sleeve of pullover shirt/dress, Put head through opening of pull over shirt/dress, Pull shirt over trunk Upper body dressing/undressing: 5: Set-up assist to: Obtain clothing/put away FIM - Lower Body Dressing/Undressing Lower body dressing/undressing steps patient completed: Thread/unthread right underwear leg, Thread/unthread left underwear leg, Pull underwear up/down, Thread/unthread right pants leg, Thread/unthread left pants leg, Pull pants up/down, Don/Doff right shoe, Fasten/unfasten right shoe, Fasten/unfasten left shoe Lower body dressing/undressing: 4: Min-Patient completed 75 plus % of tasks  FIM - Toileting Toileting steps completed by patient: Adjust clothing prior to toileting Toileting Assistive Devices: Grab bar or rail for support Toileting: 1: Total-Patient completed zero steps, helper did all 3  FIM - Radio producer Devices: Grab bars, Insurance account manager Transfers: 4-To toilet/BSC: Min A (steadying Pt. > 75%), 4-From toilet/BSC: Min A (steadying Pt. > 75%)  FIM - Bed/Chair Transfer Bed/Chair Transfer Assistive Devices: Walker, Arm rests Bed/Chair Transfer: 4: Chair or W/C > Bed: Min A (steadying  Pt. > 75%), 3: Sit > Supine: Mod  A (lifting assist/Pt. 50-74%/lift 2 legs), 4: Bed > Chair or W/C: Min A (steadying Pt. > 75%), 5: Supine > Sit: Supervision (verbal cues/safety issues)  FIM - Locomotion: Wheelchair Distance: 150 Locomotion: Wheelchair: 0: Activity did not occur FIM - Locomotion: Ambulation Locomotion: Ambulation Assistive Devices: Administrator Ambulation/Gait Assistance: 4: Min assist Locomotion: Ambulation: 4: Travels 150 ft or more with minimal assistance (Pt.>75%)  Comprehension Comprehension Mode: Auditory Comprehension: 6-Follows complex conversation/direction: With extra time/assistive device  Expression Expression Mode: Verbal Expression: 6-Expresses complex ideas: With extra time/assistive device  Social Interaction Social Interaction: 6-Interacts appropriately with others with medication or extra time (anti-anxiety, antidepressant).  Problem Solving Problem Solving: 5-Solves basic 90% of the time/requires cueing < 10% of the time  Memory Memory: 5-Recognizes or recalls 90% of the time/requires cueing < 10% of the time   Medical Problem List and Plan: 1. 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 DVT Prophylaxis/Anticoagulation: SCD's, gait/mobility 3. Pain Management: N/A--denies any discomfort.  4. Mood: LCSW to follow for evaluation and support.  5. Neuropsych: This patient is capable of making decisions on his own behalf. 6. Skin/Wound Care: Routine pressure relief measures. Monitor wound for Healing.  7. Fluids/Electrolytes/Nutrition: Will monitor I/O. Offer nutritional supplements prn poor intake. Acute renal insufficiency resolving.   -k+ normal but on high end with today's lab  8. Reactive leucocytosis: Likely due to surgery as well as steroids.  decadron taper   9. Acute on chronic anemia: Continue iron supplement.  10. Steroid induced hyperglycemia: SSI for elevated BS.  Improving 11. Onc---simulation at [facility] today. CT myelogram to be done here  -await full plan (and documentation of such) per Dr. Lisbeth Renshaw    LOS (Days) 6 A FACE TO FACE EVALUATION WAS PERFORMED  Clarence Pollard 02/10/2014 8:16 AM

## 2014-02-10 NOTE — Plan of Care (Signed)
Problem: SCI BOWEL ELIMINATION Goal: RH STG MANAGE BOWEL WITH ASSISTANCE STG Manage Bowel with mod I Assistance.  Outcome: Progressing Goal: RH STG SCI MANAGE BOWEL WITH MEDICATION WITH ASSISTANCE STG SCI Manage bowel with medication with Mod I assistance.  Outcome: Progressing  Problem: RH SKIN INTEGRITY Goal: RH STG ABLE TO PERFORM INCISION/WOUND CARE W/ASSISTANCE STG Able To Perform Incision/Wound Care With Total Assistance from caregiver.  Outcome: Progressing  Problem: RH PAIN MANAGEMENT Goal: RH STG PAIN MANAGED AT OR BELOW PT'S PAIN GOAL < 4  Outcome: Progressing

## 2014-02-10 NOTE — Progress Notes (Signed)
[facility] arrived to pick up pt at [facility]. Pt is still in radiology, [facility] is to pick him up from radiology.

## 2014-02-10 NOTE — Telephone Encounter (Signed)
called [facility] , spoke with KIM RN, patient has been scheduled   To have  [facility] bring patient to the [facility] to Radiation Oncology to have him here at 1230 pm, Myelogram on schedule for 9am, patient was sleeping when RN went to check on him this am,thanked Kim,RN 8:22 AM

## 2014-02-10 NOTE — Progress Notes (Signed)
Patient does not want to wear CPAP.  Patient aware if he changes his mind to call RN or RT.

## 2014-02-11 ENCOUNTER — Encounter (HOSPITAL_COMMUNITY): Payer: Medicare Other

## 2014-02-11 ENCOUNTER — Inpatient Hospital Stay (HOSPITAL_COMMUNITY): Payer: Medicare Other | Admitting: Physical Therapy

## 2014-02-11 LAB — GLUCOSE, CAPILLARY
GLUCOSE-CAPILLARY: 157 mg/dL — AB (ref 70–99)
Glucose-Capillary: 131 mg/dL — ABNORMAL HIGH (ref 70–99)
Glucose-Capillary: 163 mg/dL — ABNORMAL HIGH (ref 70–99)
Glucose-Capillary: 142 mg/dL — ABNORMAL HIGH (ref 70–99)

## 2014-02-11 NOTE — Plan of Care (Signed)
Problem: SCI BOWEL ELIMINATION Goal: RH STG MANAGE BOWEL WITH ASSISTANCE STG Manage Bowel with mod I Assistance.  Outcome: Not Progressing LBM 02-08-14 Goal: RH STG SCI MANAGE BOWEL WITH MEDICATION WITH ASSISTANCE STG SCI Manage bowel with medication with Mod I assistance.  Outcome: Not Progressing LBM 02-08-14  Problem: RH SKIN INTEGRITY Goal: RH STG ABLE TO PERFORM INCISION/WOUND CARE W/ASSISTANCE STG Able To Perform Incision/Wound Care With Total Assistance from caregiver.  Outcome: Progressing  Problem: RH PAIN MANAGEMENT Goal: RH STG PAIN MANAGED AT OR BELOW PT'S PAIN GOAL < 4  Outcome: Progressing

## 2014-02-11 NOTE — Progress Notes (Signed)
Social Work Patient ID: Clarence Pollard, male   DOB: 30-Nov-1940, 73 y.o.   MRN: [id number]   Met with pt following team conference to report targeted d/c date still of 11/28. Pt meeting with RN reviewed with [facility].  Provided her with documentation needed and she reports they are ready for pt to admit to their facility on 11/28.  Pt very pleased with progress.  Reports he had had a full day with morning myelogram and afternoon radiation onc appointment.  Denies any concerns at this time.  Kimaria Struthers, LCSW

## 2014-02-11 NOTE — Progress Notes (Signed)
Physical Therapy Session Note  Patient Details  Name: Clarence Pollard MRN: [id number] Date of Birth: 07-13-1940  Today's Date: 02/11/2014 PT Individual Time: 1300-1400 PT Individual Time Calculation (min): 60 min   Short Term Goals: Week 1:  PT Short Term Goal 1 (Week 1): STGs = LTGs due to ELOS  Skilled Therapeutic Interventions/Progress Updates:    Therapeutic Activity: PT instructs pt in stand-step transfer from recliner to bed req SBA and verbal cues for safety.  PT instructs pt in sit to side lie transfer req mod A for B LEs, progressing to min A, then to SBA x 5 reps total, with verbal cues for use of momentum and UE assisting top leg onto the bed - done to R and to L side in bed.  PT instructs pt in side lie to sit transfer x 5 reps req SBA and verbal cues for technique, progressing to mod I with repeated repetitions - done to R and to L side of bed   Therapeutic Exercise: PT instructs pt in B LE strengthening exercises: side lie hip abduction, SLR, isometric hip adduction with folded pillow between knees: 3 x 10 reps to each LE  Gait Training: PT instructs pt in ambulation with RW 160' x 2 reps req close SBA for safety - pt keeps neck in cervical flexion and eyes focused on feet to compensate for proprioceptive deficits in legs.   Pt's bed mobility is slowly progressing towards independence. PT wrote above therapeutic exercises on a sheet of paper and instructed pt to do these exercises on Thanksgiving Day, in order to continue progressing with strengthening. Pt's memory for previous mobility corrections is improving. Continue per PT POC.   Therapy Documentation Precautions:  Precautions Precautions: Fall, Back Precaution Booklet Issued: No Restrictions Weight Bearing Restrictions: No  Pain: Pain Assessment Pain Assessment: No/denies pain  See FIM for current functional status  Therapy/Group: Individual Therapy  Jacobo Moncrief M 02/11/2014, 1:07 PM

## 2014-02-11 NOTE — Progress Notes (Signed)
Patient's TB skin test read per protocol. Result was negative at 57mm.  Completed result sheet placed in patient's shadow chart.

## 2014-02-11 NOTE — Patient Care Conference (Signed)
Inpatient RehabilitationTeam Conference and Plan of Care Update Date: 02/10/2014   Time: 2:10 PM    Patient Name: Clarence Pollard      Medical Record Number: [id number]  Date of Birth: [birth year] Sex: Male         Room/Bed: 4W19C/4W19C-01 Payor Info: Payor: [employer] park manager MEDICARE / Plan: AARP MEDICARE COMPLETE / Product Type: *No Product type* /    Admitting Diagnosis: thoracic tumor resection  Admit Date/Time:  02/04/2014  2:52 PM Admission Comments: No comment available   Primary Diagnosis:  Prostate cancer metastatic to bone Principal Problem: Prostate cancer metastatic to bone  Patient Active Problem List   Diagnosis Date Noted  . Hypokalemia 02/06/2014  . Prostate cancer metastatic to bone 02/04/2014  . Paraparesis of both lower limbs 02/04/2014  . Spine metastasis 01/31/2014  . Spinal cord tumor   . Insomnia   . Lower extremity weakness   . Spinal cord compression 01/26/2014  . 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 Malignant neoplasm of prostate 07/25/2013  . Chronic cholecystitis with calculus 10/08/2012    Expected Discharge Date: Expected Discharge Date: 02/14/14  Team Members Present: Physician leading conference: Dr. Alger Simons Social Worker Present: Lennart Pall, LCSW Nurse Present: Elliot Cousin, RN PT Present: Canary Brim, Lorriane Shire, PT OT Present: Salome Spotted, OT;Patricia Cory Munch, OT PPS Coordinator present : Daiva Nakayama, RN, CRRN     Current Status/Progress Goal Weekly Team Focus  Medical   prostate cancer to the spine. marking today. motivated. pain improved  improve activity tolerance and pain control  cancer/onc work up. pain control   Bowel/Bladder   Continent of bowel and bladder; LBM 11/22  Mod I  Continue current regimen; assess for constipation and treat as needed   Swallow/Nutrition/ Hydration             ADL's   Min A lower body dressing, supervision & setup upper body           Mobility   Overall MinGuard-MinA, limited by decreased balance, weakness in BLE, requires cueing to maintain back precautions during mobility  Overall mod (I)  Endurance, gait training, standing balance, maintaining precautions   Communication             Safety/Cognition/ Behavioral Observations  No unsafe behaviors witnessed         Pain   Denies pain  < 3  Assess and treat for pain q shift and prn   Skin   Incision to back clean dry and intact; no drainage noted; MASD to perineum- using nystatin powder  Remain free from skin breakdown or infection with min assist  Assess skin q shift and prn    Rehab Goals Patient on target to meet rehab goals: Yes *See Care Plan and progress notes for long and short-term goals.  Barriers to Discharge: back precautions, neruor deficits,    Possible Resolutions to Barriers:  nmr, MOD i GOALS, SOME ASSISTANCE AT NEXT LIVING situation.     Discharge Planning/Teaching Needs:  Plan to d/c directely to independent living apt at [facility] from [facility]      Team Discussion:  Doing very well with mobility and ADLS.  No concerns.  Mod i goals.  SW reports d/c plan to d/c directely to IL apt at [facility].  Still needs some cue to maintain precautions.  Revisions to Treatment Plan:  None   Continued Need for Acute Rehabilitation Level of Care: The patient requires daily medical  management by a physician with specialized training in physical medicine and rehabilitation for the following conditions: Daily direction of a multidisciplinary physical rehabilitation program to ensure safe treatment while eliciting the highest outcome that is of practical value to the patient.: Yes Daily medical management of patient stability for increased activity during participation in an intensive rehabilitation regime.: Yes Daily analysis of laboratory values and/or radiology reports with any subsequent need for medication adjustment of medical intervention for  : Post surgical problems;Neurological problems  [name], [place] 02/11/2014, 11:40 AM

## 2014-02-11 NOTE — Plan of Care (Signed)
Problem: SCI BOWEL ELIMINATION Goal: RH STG MANAGE BOWEL WITH ASSISTANCE STG Manage Bowel with mod I Assistance.  Outcome: Progressing Goal: RH STG SCI MANAGE BOWEL WITH MEDICATION WITH ASSISTANCE STG SCI Manage bowel with medication with Mod I assistance.  Outcome: Progressing  Problem: RH SKIN INTEGRITY Goal: RH STG ABLE TO PERFORM INCISION/WOUND CARE W/ASSISTANCE STG Able To Perform Incision/Wound Care With Total Assistance from caregiver.  Outcome: Progressing  Problem: RH PAIN MANAGEMENT Goal: RH STG PAIN MANAGED AT OR BELOW PT'S PAIN GOAL < 4  Outcome: Progressing

## 2014-02-11 NOTE — Progress Notes (Signed)
Physical Therapy Note  Patient Details  Name: Clarence Pollard MRN: [id number] Date of Birth: 27-Oct-1940 Today's Date: 02/11/2014    Patient not seen for therapy on Tuesday, 02/10/2014 due to being placed on medical hold for the day. Patient out of hospital for appointments related to cancer diagnosis and unavailable to participate in therapy. Will follow up and continue treatment per POC.   Rada Hay 02/11/2014, 9:18 AM

## 2014-02-11 NOTE — Progress Notes (Signed)
Physical Therapy Session Note  Patient Details  Name: Clarence Pollard MRN: [id number] Date of Birth: 24-Apr-1940  Today's Date: 02/11/2014 PT Individual Time: 1505-1605 PT Individual Time Calculation (min): 60 min   Short Term Goals: Week 1:  PT Short Term Goal 1 (Week 1): STGs = LTGs due to ELOS  Skilled Therapeutic Interventions/Progress Updates:   Pt received sitting in recliner, agreeable to therapy. Pt able to recall 3/3 back precautions with extra time. Gait training room <> therapy gym using RW, 2 x 160 ft with close supervision. Pt requires verbal cues for upright posture with cervical flexion/downward gaze. In therapy gym, patient participated in game of Wii bowling in standing to challenge standing tolerance, endurance, dynamic balance, and decreased UE support on RW with close supervision. Pt requires intermittent seated rest breaks, able to remain standing for 3-10 minutes at a time. From w/c level, patient participated in short game of Wii basketball to challenge bilateral fine motor control. Pt returned to room and left sitting in recliner with all needs within reach.   Therapy Documentation Precautions:  Precautions Precautions: Fall, Back Precaution Booklet Issued: No Restrictions Weight Bearing Restrictions: No Pain: Pain Assessment Pain Assessment: No/denies pain  See FIM for current functional status  Therapy/Group: Individual Therapy  Laretta Alstrom 02/11/2014, 4:11 PM

## 2014-02-11 NOTE — Progress Notes (Signed)
[facility] PHYSICAL MEDICINE & REHABILITATION     PROGRESS NOTE    Subjective/Complaints: No complaints. Had long day yesterday at [facility]. No issues this morning No sob, cough Objective: Vital Signs: Blood pressure 114/59, pulse 63, temperature 97.8 F (36.6 C), temperature source Oral, resp. rate 18, height 6' (1.829 m), SpO2 100 %. Ct Thoracic Spine W Contrast  02/10/2014   ADDENDUM REPORT: 02/10/2014 13:57  ADDENDUM: Critical Value/emergent results were called by telephone at the time of interpretation on 02/10/2014 at 1:50 pm to Dr. Vertell Limber , who verbally acknowledged these results.   Electronically Signed   By: Franchot Gallo M.D.   On: 02/10/2014 13:57   02/10/2014   CLINICAL DATA:  Metastatic prostate cancer. Thoracic laminectomy and decompression 01/29/2014. CT myelogram requested for radiation therapy planning purposes.  FLUOROSCOPY TIME:  3 min 12 seconds  PROCEDURE: LUMBAR PUNCTURE FOR THORACIC AND LUMBAR MYELOGRAM  After thorough discussion of risks and benefits of the procedure including bleeding, infection, injury to nerves, blood vessels, adjacent structures as well as headache and CSF leak, written and oral informed consent was obtained. Consent was obtained by Dr. Franchot Gallo.  I personally performed the lumbar puncture and administered the intrathecal contrast. I also personally supervised acquisition of the myelogram images.  Patient was positioned prone on the fluoroscopy table. Local anesthesia was provided with 1% lidocaine without epinephrine after prepped and draped in the usual sterile fashion. Puncture was performed at L2-3 using a 3 1/2 inch 22-gauge spinal needle via paramedian approach. Using a single pass through the dura, the needle was placed within the thecal sac, with return of clear CSF. 10 mL of Omnipaque-300 was injected into the thecal sac, with normal opacification of the nerve roots and cauda equina consistent with free flow within the subarachnoid space. The  patient was then moved to the trendelenburg position and contrast flowed into the Thoracic spine region.  TECHNIQUE: Contiguous axial images were obtained through the Thoracic and Lumbar spine after the intrathecal infusion of infusion. Coronal and sagittal reconstructions were obtained of the axial image sets.  FINDINGS: THORACIC AND LUMBAR MYELOGRAM FINDINGS:  Diffuse sclerotic metastatic disease is noted throughout the thoracic spine as noted on prior MRI studies. Recent T8 through T10 laminectomy. On the lateral view, there is posterior epidural soft tissue indenting the dura and narrowing the spinal canal significantly. This is touching the cord and there may be cord compression. See separate CT myelogram report. No other areas of spinal stenosis are identified. No intradural mass.  In the lumbar spine, there is bony metastatic disease. There is a complete block at L4-5. Based on the MRI this is related to disc and facet degeneration.  CT THORACIC MYELOGRAM FINDINGS:  Sclerotic metastatic disease is present from T6 through T11. Tumor extends into the posterior elements and ribs at multiple levels. No pathologic fracture. Recent Total laminectomy at T8, T9, and T10 for decompression related to metastatic disease. As noted on the myelogram, the dura is displaced anteriorly due to a posterior soft tissue process extending from T8 through T10. There is compression of the cord. Overall this process has progressed in extent and severity since the preoperative MRI of 01/28/2014. Given the recent laminectomy, this is not likely to represent recurrent tumor but more likely postoperative blood. Infection considered a less likely possibility.  Moderately large right-sided disc protrusion at T1 -2. Slightly smaller right-sided disc protrusion at T2-3. Small central disc protrusion at T3-4.  Sclerotic bony metastatic disease is seen in  the upper thoracic spine at several levels. No pathologic fracture. No cord compression  in the upper thoracic spine.  CT LUMBAR MYELOGRAM FINDINGS:  Sclerotic metastatic disease is present in the T2 vertebral body. No epidural tumor this level. No pathologic fracture in the lumbar spine. Bilateral renal small calculi without hydronephrosis.  L1-2:  Disc and facet degeneration without significant stenosis  L2-3: Diffuse disc bulging and facet hypertrophy with mild to moderate spinal stenosis  L3-4: Diffuse disc bulging and facet hypertrophy with moderate to severe spinal stenosis  L4-5: Severe spinal stenosis related to extensive disc bulging and facet hypertrophy.  L5-S1: Advanced disc degeneration and spondylosis with foraminal encroachment bilaterally secondary to spurring.  IMPRESSION: Widespread bony metastatic disease throughout the thoracic and lumbar spine without pathologic fracture.  Recent decompression with total laminectomy at T8, T9, and T10. There is soft tissue filling the surgical bed with displacement of the posterior dura causing cord compression. This process has progressed in extent and severity since the preoperative MRI and presumably represents postop hematoma. Infection and tumor considered less likely.  Lumbar spondylosis with multilevel spinal stenosis. Moderate to severe stenosis at L3-4 and severe spinal stenosis with near complete block of contrast at L4-5.  Electronically Signed: By: Franchot Gallo M.D. On: 02/10/2014 13:14   Ct Lumbar Spine W Contrast  02/10/2014   ADDENDUM REPORT: 02/10/2014 13:57  ADDENDUM: Critical Value/emergent results were called by telephone at the time of interpretation on 02/10/2014 at 1:50 pm to Dr. Vertell Limber , who verbally acknowledged these results.   Electronically Signed   By: Franchot Gallo M.D.   On: 02/10/2014 13:57   02/10/2014   CLINICAL DATA:  Metastatic prostate cancer. Thoracic laminectomy and decompression 01/29/2014. CT myelogram requested for radiation therapy planning purposes.  FLUOROSCOPY TIME:  3 min 12 seconds  PROCEDURE:  LUMBAR PUNCTURE FOR THORACIC AND LUMBAR MYELOGRAM  After thorough discussion of risks and benefits of the procedure including bleeding, infection, injury to nerves, blood vessels, adjacent structures as well as headache and CSF leak, written and oral informed consent was obtained. Consent was obtained by Dr. Franchot Gallo.  I personally performed the lumbar puncture and administered the intrathecal contrast. I also personally supervised acquisition of the myelogram images.  Patient was positioned prone on the fluoroscopy table. Local anesthesia was provided with 1% lidocaine without epinephrine after prepped and draped in the usual sterile fashion. Puncture was performed at L2-3 using a 3 1/2 inch 22-gauge spinal needle via paramedian approach. Using a single pass through the dura, the needle was placed within the thecal sac, with return of clear CSF. 10 mL of Omnipaque-300 was injected into the thecal sac, with normal opacification of the nerve roots and cauda equina consistent with free flow within the subarachnoid space. The patient was then moved to the trendelenburg position and contrast flowed into the Thoracic spine region.  TECHNIQUE: Contiguous axial images were obtained through the Thoracic and Lumbar spine after the intrathecal infusion of infusion. Coronal and sagittal reconstructions were obtained of the axial image sets.  FINDINGS: THORACIC AND LUMBAR MYELOGRAM FINDINGS:  Diffuse sclerotic metastatic disease is noted throughout the thoracic spine as noted on prior MRI studies. Recent T8 through T10 laminectomy. On the lateral view, there is posterior epidural soft tissue indenting the dura and narrowing the spinal canal significantly. This is touching the cord and there may be cord compression. See separate CT myelogram report. No other areas of spinal stenosis are identified. No intradural mass.  In the  lumbar spine, there is bony metastatic disease. There is a complete block at L4-5. Based on the MRI  this is related to disc and facet degeneration.  CT THORACIC MYELOGRAM FINDINGS:  Sclerotic metastatic disease is present from T6 through T11. Tumor extends into the posterior elements and ribs at multiple levels. No pathologic fracture. Recent Total laminectomy at T8, T9, and T10 for decompression related to metastatic disease. As noted on the myelogram, the dura is displaced anteriorly due to a posterior soft tissue process extending from T8 through T10. There is compression of the cord. Overall this process has progressed in extent and severity since the preoperative MRI of 01/28/2014. Given the recent laminectomy, this is not likely to represent recurrent tumor but more likely postoperative blood. Infection considered a less likely possibility.  Moderately large right-sided disc protrusion at T1 -2. Slightly smaller right-sided disc protrusion at T2-3. Small central disc protrusion at T3-4.  Sclerotic bony metastatic disease is seen in the upper thoracic spine at several levels. No pathologic fracture. No cord compression in the upper thoracic spine.  CT LUMBAR MYELOGRAM FINDINGS:  Sclerotic metastatic disease is present in the T2 vertebral body. No epidural tumor this level. No pathologic fracture in the lumbar spine. Bilateral renal small calculi without hydronephrosis.  L1-2:  Disc and facet degeneration without significant stenosis  L2-3: Diffuse disc bulging and facet hypertrophy with mild to moderate spinal stenosis  L3-4: Diffuse disc bulging and facet hypertrophy with moderate to severe spinal stenosis  L4-5: Severe spinal stenosis related to extensive disc bulging and facet hypertrophy.  L5-S1: Advanced disc degeneration and spondylosis with foraminal encroachment bilaterally secondary to spurring.  IMPRESSION: Widespread bony metastatic disease throughout the thoracic and lumbar spine without pathologic fracture.  Recent decompression with total laminectomy at T8, T9, and T10. There is soft tissue  filling the surgical bed with displacement of the posterior dura causing cord compression. This process has progressed in extent and severity since the preoperative MRI and presumably represents postop hematoma. Infection and tumor considered less likely.  Lumbar spondylosis with multilevel spinal stenosis. Moderate to severe stenosis at L3-4 and severe spinal stenosis with near complete block of contrast at L4-5.  Electronically Signed: By: Franchot Gallo M.D. On: 02/10/2014 13:14   Dg Myelography Lumbar Inj Thoracic  02/10/2014   ADDENDUM REPORT: 02/10/2014 13:57  ADDENDUM: Critical Value/emergent results were called by telephone at the time of interpretation on 02/10/2014 at 1:50 pm to Dr. Vertell Limber , who verbally acknowledged these results.   Electronically Signed   By: Franchot Gallo M.D.   On: 02/10/2014 13:57   02/10/2014   CLINICAL DATA:  Metastatic prostate cancer. Thoracic laminectomy and decompression 01/29/2014. CT myelogram requested for radiation therapy planning purposes.  FLUOROSCOPY TIME:  3 min 12 seconds  PROCEDURE: LUMBAR PUNCTURE FOR THORACIC AND LUMBAR MYELOGRAM  After thorough discussion of risks and benefits of the procedure including bleeding, infection, injury to nerves, blood vessels, adjacent structures as well as headache and CSF leak, written and oral informed consent was obtained. Consent was obtained by Dr. Franchot Gallo.  I personally performed the lumbar puncture and administered the intrathecal contrast. I also personally supervised acquisition of the myelogram images.  Patient was positioned prone on the fluoroscopy table. Local anesthesia was provided with 1% lidocaine without epinephrine after prepped and draped in the usual sterile fashion. Puncture was performed at L2-3 using a 3 1/2 inch 22-gauge spinal needle via paramedian approach. Using a single pass through the dura, the  needle was placed within the thecal sac, with return of clear CSF. 10 mL of Omnipaque-300 was  injected into the thecal sac, with normal opacification of the nerve roots and cauda equina consistent with free flow within the subarachnoid space. The patient was then moved to the trendelenburg position and contrast flowed into the Thoracic spine region.  TECHNIQUE: Contiguous axial images were obtained through the Thoracic and Lumbar spine after the intrathecal infusion of infusion. Coronal and sagittal reconstructions were obtained of the axial image sets.  FINDINGS: THORACIC AND LUMBAR MYELOGRAM FINDINGS:  Diffuse sclerotic metastatic disease is noted throughout the thoracic spine as noted on prior MRI studies. Recent T8 through T10 laminectomy. On the lateral view, there is posterior epidural soft tissue indenting the dura and narrowing the spinal canal significantly. This is touching the cord and there may be cord compression. See separate CT myelogram report. No other areas of spinal stenosis are identified. No intradural mass.  In the lumbar spine, there is bony metastatic disease. There is a complete block at L4-5. Based on the MRI this is related to disc and facet degeneration.  CT THORACIC MYELOGRAM FINDINGS:  Sclerotic metastatic disease is present from T6 through T11. Tumor extends into the posterior elements and ribs at multiple levels. No pathologic fracture. Recent Total laminectomy at T8, T9, and T10 for decompression related to metastatic disease. As noted on the myelogram, the dura is displaced anteriorly due to a posterior soft tissue process extending from T8 through T10. There is compression of the cord. Overall this process has progressed in extent and severity since the preoperative MRI of 01/28/2014. Given the recent laminectomy, this is not likely to represent recurrent tumor but more likely postoperative blood. Infection considered a less likely possibility.  Moderately large right-sided disc protrusion at T1 -2. Slightly smaller right-sided disc protrusion at T2-3. Small central disc  protrusion at T3-4.  Sclerotic bony metastatic disease is seen in the upper thoracic spine at several levels. No pathologic fracture. No cord compression in the upper thoracic spine.  CT LUMBAR MYELOGRAM FINDINGS:  Sclerotic metastatic disease is present in the T2 vertebral body. No epidural tumor this level. No pathologic fracture in the lumbar spine. Bilateral renal small calculi without hydronephrosis.  L1-2:  Disc and facet degeneration without significant stenosis  L2-3: Diffuse disc bulging and facet hypertrophy with mild to moderate spinal stenosis  L3-4: Diffuse disc bulging and facet hypertrophy with moderate to severe spinal stenosis  L4-5: Severe spinal stenosis related to extensive disc bulging and facet hypertrophy.  L5-S1: Advanced disc degeneration and spondylosis with foraminal encroachment bilaterally secondary to spurring.  IMPRESSION: Widespread bony metastatic disease throughout the thoracic and lumbar spine without pathologic fracture.  Recent decompression with total laminectomy at T8, T9, and T10. There is soft tissue filling the surgical bed with displacement of the posterior dura causing cord compression. This process has progressed in extent and severity since the preoperative MRI and presumably represents postop hematoma. Infection and tumor considered less likely.  Lumbar spondylosis with multilevel spinal stenosis. Moderate to severe stenosis at L3-4 and severe spinal stenosis with near complete block of contrast at L4-5.  Electronically Signed: By: Franchot Gallo M.D. On: 02/10/2014 13:14   No results for input(s): WBC, HGB, HCT, PLT in the last 72 hours.  Recent Labs  02/10/14 0516  NA 136*  K 5.0  CL 95*  GLUCOSE 135*  BUN 37*  CREATININE 0.83  CALCIUM 8.4   CBG (last 3)  Recent Labs  02/10/14 1710 02/10/14 2104 02/11/14 0649  GLUCAP 143* 116* 131*    Wt Readings from Last 3 Encounters:  01/27/14 116.3 kg (256 lb 6.3 oz)  01/26/14 120.657 kg (266 lb)   01/26/14 120.657 kg (266 lb)    Physical Exam:  Constitutional: He is oriented to person, place, and time. He appears well-developed and well-nourished.  HENT: dentition fair,  Head: Normocephalic and atraumatic.  Eyes: Conjunctivae are normal. Pupils are equal, round, and reactive to light.  Neck: Normal range of motion. Neck supple.  Cardiovascular: Normal rate and regular rhythm.  Respiratory: Effort normal and breath sounds normal. No respiratory distress. He has no wheezes.  GI: Soft. Bowel sounds are normal.  Neurological: He is alert and oriented to person, place, and time.   Decrease in fine motor movements R>LLE. Mild sensory deficits BLE  Improved proprioception in feet especially.  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   UE's 4/5. LE: 4- hf, 4 ke and 4/5 adf/apf. Cognitively intact  Skin: Skin is warm and dry. Back incision clean. Staples out Psychiatric: He has a normal mood and affect. His behavior is normal. Judgment and thought content normal  Assessment/Plan: 1. 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 Physiatrist is providing close team supervision and 24 hour management of active medical problems listed below. Physiatrist and rehab team continue to assess barriers to discharge/monitor patient progress toward functional and medical goals.  FIM: FIM - Bathing Bathing Steps Patient Completed: Chest, Right Arm, Left Arm, Abdomen, Front perineal area, Buttocks, Right upper leg, Left upper leg, Right lower leg (including foot), Left lower leg (including foot) Bathing: 5: Supervision: Safety issues/verbal cues  FIM - Upper Body Dressing/Undressing Upper body dressing/undressing steps patient completed: Thread/unthread right sleeve of pullover shirt/dresss, Thread/unthread left sleeve of pullover shirt/dress,  Put head through opening of pull over shirt/dress, Pull shirt over trunk Upper body dressing/undressing: 5: Set-up assist to: Obtain clothing/put away FIM - Lower Body Dressing/Undressing Lower body dressing/undressing steps patient completed: Thread/unthread right underwear leg, Thread/unthread left underwear leg, Pull underwear up/down, Thread/unthread right pants leg, Thread/unthread left pants leg, Pull pants up/down, Don/Doff right shoe, Fasten/unfasten right shoe, Fasten/unfasten left shoe Lower body dressing/undressing: 4: Min-Patient completed 75 plus % of tasks  FIM - Toileting Toileting steps completed by patient: Adjust clothing prior to toileting Toileting Assistive Devices: Grab bar or rail for support Toileting: 1: Total-Patient completed zero steps, helper did all 3  FIM - Radio producer Devices: Grab bars, Insurance account manager Transfers: 4-To toilet/BSC: Min A (steadying Pt. > 75%), 4-From toilet/BSC: Min A (steadying Pt. > 75%)  FIM - Bed/Chair Transfer Bed/Chair Transfer Assistive Devices: Walker, Arm rests Bed/Chair Transfer: 4: Chair or W/C > Bed: Min A (steadying Pt. > 75%), 3: Sit > Supine: Mod A (lifting assist/Pt. 50-74%/lift 2 legs), 4: Bed > Chair or W/C: Min A (steadying Pt. > 75%), 5: Supine > Sit: Supervision (verbal cues/safety issues)  FIM - Locomotion: Wheelchair Distance: 150 Locomotion: Wheelchair: 0: Activity did not occur FIM - Locomotion: Ambulation Locomotion: Ambulation Assistive Devices: Administrator Ambulation/Gait Assistance: 4: Min assist Locomotion: Ambulation: 4: Travels 150 ft or more with minimal assistance (Pt.>75%)  Comprehension Comprehension Mode: Auditory Comprehension: 7-Follows complex conversation/direction: With no assist  Expression Expression Mode: Verbal Expression: 7-Expresses complex ideas: With no assist  Social Interaction Social Interaction: 6-Interacts appropriately  with others with  medication or extra time (anti-anxiety, antidepressant).  Problem Solving Problem Solving: 5-Solves basic 90% of the time/requires cueing < 10% of the time  Memory Memory: 5-Recognizes or recalls 90% of the time/requires cueing < 10% of the time   Medical Problem List and Plan: 1. 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 DVT Prophylaxis/Anticoagulation: SCD's, gait/mobility 3. Pain Management: N/A--denies any discomfort.  4. Mood: LCSW to follow for evaluation and support.  5. Neuropsych: This patient is capable of making decisions on his own behalf. 6. Skin/Wound Care: Routine pressure relief measures. Monitor wound for Healing.  7. Fluids/Electrolytes/Nutrition: Will monitor I/O. Offer nutritional supplements prn poor intake. Acute renal insufficiency resolving.   -k+ has been high-normal 8. Reactive leucocytosis: Likely due to surgery as well as steroids.  decadron taper   9. Acute on chronic anemia: Continue iron supplement.  10. Steroid induced hyperglycemia: SSI for elevated BS. Improving 11. Onc---simulation at [facility] on 11/24. CT myelogram to be done here before dc?  -await full plan (and documentation of such) per Dr. Lisbeth Renshaw    LOS (Days) 7 A FACE TO FACE EVALUATION WAS PERFORMED  Conception Doebler T 02/11/2014 8:39 AM

## 2014-02-11 NOTE — Progress Notes (Signed)
Occupational Therapy Session Note  Patient Details  Name: Clarence Pollard MRN: [id number] Date of Birth: [birth year]  Today's Date: 02/11/2014 OT Individual Time: 1100-1200 OT Individual Time Calculation (min): 60 min    Short Term Goals: Week 1:  OT Short Term Goal 1 (Week 1): STG=LTG d/t short ELOS  Skilled Therapeutic Interventions/Progress Updates:    Pt engaged in BADL retraining including bathing at shower level and dressing with sit<>stand from chair.  Pt utilized AE appropriately to assist with bathing and LB dressing tasks.  Pt exhibited LOB X 1 when ambulating with RW into bathroom.  Pt required min A to correct LOB.  Pt required extra time to complete all tasks.  Focus on activity tolerance, functional amb with RW, sit<>stand, standing balance, and safety awareness.  Therapy Documentation Precautions:  Precautions Precautions: Fall, Back Precaution Booklet Issued: No Restrictions Weight Bearing Restrictions: No   ADL: ADL ADL Comments: see FIM  See FIM for current functional status  Therapy/Group: Individual Therapy  Leroy Libman 02/11/2014, 12:08 PM

## 2014-02-11 NOTE — Progress Notes (Signed)
Patient refused CPAP.  States he doesn't wear at home.  Patient encouraged to call for Respiratory is he wishes to wear CPAP during his hospital stay.

## 2014-02-12 LAB — GLUCOSE, CAPILLARY
GLUCOSE-CAPILLARY: 124 mg/dL — AB (ref 70–99)
Glucose-Capillary: 141 mg/dL — ABNORMAL HIGH (ref 70–99)
Glucose-Capillary: 204 mg/dL — ABNORMAL HIGH (ref 70–99)
Glucose-Capillary: 173 mg/dL — ABNORMAL HIGH (ref 70–99)

## 2014-02-12 NOTE — Progress Notes (Signed)
[facility]     PROGRESS NOTE    Subjective/Complaints: In good spirits. Getting stronger. Little pain.  No sob, cough Objective: Vital Signs: Blood pressure 112/54, pulse 61, temperature 97.7 F (36.5 C), temperature source Oral, resp. rate 18, height 6' (1.829 m), weight 111.358 kg (245 lb 8 oz), SpO2 99 %. Ct Thoracic Spine W Contrast  02/10/2014   ADDENDUM REPORT: 02/10/2014 13:57  ADDENDUM: Critical Value/emergent results were called by telephone at the time of interpretation on 02/10/2014 at 1:50 pm to Dr. Vertell Limber , who verbally acknowledged these results.   Electronically Signed   By: Franchot Gallo M.D.   On: 02/10/2014 13:57   02/10/2014   CLINICAL DATA:  Metastatic prostate cancer. Thoracic laminectomy and decompression 01/29/2014. CT myelogram requested for radiation therapy planning purposes.  FLUOROSCOPY TIME:  3 min 12 seconds  PROCEDURE: LUMBAR PUNCTURE FOR THORACIC AND LUMBAR MYELOGRAM  After thorough discussion of risks and benefits of the procedure including bleeding, infection, injury to nerves, blood vessels, adjacent structures as well as headache and CSF leak, written and oral informed consent was obtained. Consent was obtained by Dr. Franchot Gallo.  I personally performed the lumbar puncture and administered the intrathecal contrast. I also personally supervised acquisition of the myelogram images.  Patient was positioned prone on the fluoroscopy table. Local anesthesia was provided with 1% lidocaine without epinephrine after prepped and draped in the usual sterile fashion. Puncture was performed at L2-3 using a 3 1/2 inch 22-gauge spinal needle via paramedian approach. Using a single pass through the dura, the needle was placed within the thecal sac, with return of clear CSF. 10 mL of Omnipaque-300 was injected into the thecal sac, with normal opacification of the nerve roots and cauda equina consistent with free flow within the subarachnoid  space. The patient was then moved to the trendelenburg position and contrast flowed into the Thoracic spine region.  TECHNIQUE: Contiguous axial images were obtained through the Thoracic and Lumbar spine after the intrathecal infusion of infusion. Coronal and sagittal reconstructions were obtained of the axial image sets.  FINDINGS: THORACIC AND LUMBAR MYELOGRAM FINDINGS:  Diffuse sclerotic metastatic disease is noted throughout the thoracic spine as noted on prior MRI studies. Recent T8 through T10 laminectomy. On the lateral view, there is posterior epidural soft tissue indenting the dura and narrowing the spinal canal significantly. This is touching the cord and there may be cord compression. See separate CT myelogram report. No other areas of spinal stenosis are identified. No intradural mass.  In the lumbar spine, there is bony metastatic disease. There is a complete block at L4-5. Based on the MRI this is related to disc and facet degeneration.  CT THORACIC MYELOGRAM FINDINGS:  Sclerotic metastatic disease is present from T6 through T11. Tumor extends into the posterior elements and ribs at multiple levels. No pathologic fracture. Recent Total laminectomy at T8, T9, and T10 for decompression related to metastatic disease. As noted on the myelogram, the dura is displaced anteriorly due to a posterior soft tissue process extending from T8 through T10. There is compression of the cord. Overall this process has progressed in extent and severity since the preoperative MRI of 01/28/2014. Given the recent laminectomy, this is not likely to represent recurrent tumor but more likely postoperative blood. Infection considered a less likely possibility.  Moderately large right-sided disc protrusion at T1 -2. Slightly smaller right-sided disc protrusion at T2-3. Small central disc protrusion at T3-4.  Sclerotic bony metastatic disease  is seen in the upper thoracic spine at several levels. No pathologic fracture. No cord  compression in the upper thoracic spine.  CT LUMBAR MYELOGRAM FINDINGS:  Sclerotic metastatic disease is present in the T2 vertebral body. No epidural tumor this level. No pathologic fracture in the lumbar spine. Bilateral renal small calculi without hydronephrosis.  L1-2:  Disc and facet degeneration without significant stenosis  L2-3: Diffuse disc bulging and facet hypertrophy with mild to moderate spinal stenosis  L3-4: Diffuse disc bulging and facet hypertrophy with moderate to severe spinal stenosis  L4-5: Severe spinal stenosis related to extensive disc bulging and facet hypertrophy.  L5-S1: Advanced disc degeneration and spondylosis with foraminal encroachment bilaterally secondary to spurring.  IMPRESSION: Widespread bony metastatic disease throughout the thoracic and lumbar spine without pathologic fracture.  Recent decompression with total laminectomy at T8, T9, and T10. There is soft tissue filling the surgical bed with displacement of the posterior dura causing cord compression. This process has progressed in extent and severity since the preoperative MRI and presumably represents postop hematoma. Infection and tumor considered less likely.  Lumbar spondylosis with multilevel spinal stenosis. Moderate to severe stenosis at L3-4 and severe spinal stenosis with near complete block of contrast at L4-5.  Electronically Signed: By: Franchot Gallo M.D. On: 02/10/2014 13:14   Ct Lumbar Spine W Contrast  02/10/2014   ADDENDUM REPORT: 02/10/2014 13:57  ADDENDUM: Critical Value/emergent results were called by telephone at the time of interpretation on 02/10/2014 at 1:50 pm to Dr. Vertell Limber , who verbally acknowledged these results.   Electronically Signed   By: Franchot Gallo M.D.   On: 02/10/2014 13:57   02/10/2014   CLINICAL DATA:  Metastatic prostate cancer. Thoracic laminectomy and decompression 01/29/2014. CT myelogram requested for radiation therapy planning purposes.  FLUOROSCOPY TIME:  3 min 12 seconds   PROCEDURE: LUMBAR PUNCTURE FOR THORACIC AND LUMBAR MYELOGRAM  After thorough discussion of risks and benefits of the procedure including bleeding, infection, injury to nerves, blood vessels, adjacent structures as well as headache and CSF leak, written and oral informed consent was obtained. Consent was obtained by Dr. Franchot Gallo.  I personally performed the lumbar puncture and administered the intrathecal contrast. I also personally supervised acquisition of the myelogram images.  Patient was positioned prone on the fluoroscopy table. Local anesthesia was provided with 1% lidocaine without epinephrine after prepped and draped in the usual sterile fashion. Puncture was performed at L2-3 using a 3 1/2 inch 22-gauge spinal needle via paramedian approach. Using a single pass through the dura, the needle was placed within the thecal sac, with return of clear CSF. 10 mL of Omnipaque-300 was injected into the thecal sac, with normal opacification of the nerve roots and cauda equina consistent with free flow within the subarachnoid space. The patient was then moved to the trendelenburg position and contrast flowed into the Thoracic spine region.  TECHNIQUE: Contiguous axial images were obtained through the Thoracic and Lumbar spine after the intrathecal infusion of infusion. Coronal and sagittal reconstructions were obtained of the axial image sets.  FINDINGS: THORACIC AND LUMBAR MYELOGRAM FINDINGS:  Diffuse sclerotic metastatic disease is noted throughout the thoracic spine as noted on prior MRI studies. Recent T8 through T10 laminectomy. On the lateral view, there is posterior epidural soft tissue indenting the dura and narrowing the spinal canal significantly. This is touching the cord and there may be cord compression. See separate CT myelogram report. No other areas of spinal stenosis are identified. No intradural mass.  In the lumbar spine, there is bony metastatic disease. There is a complete block at L4-5. Based  on the MRI this is related to disc and facet degeneration.  CT THORACIC MYELOGRAM FINDINGS:  Sclerotic metastatic disease is present from T6 through T11. Tumor extends into the posterior elements and ribs at multiple levels. No pathologic fracture. Recent Total laminectomy at T8, T9, and T10 for decompression related to metastatic disease. As noted on the myelogram, the dura is displaced anteriorly due to a posterior soft tissue process extending from T8 through T10. There is compression of the cord. Overall this process has progressed in extent and severity since the preoperative MRI of 01/28/2014. Given the recent laminectomy, this is not likely to represent recurrent tumor but more likely postoperative blood. Infection considered a less likely possibility.  Moderately large right-sided disc protrusion at T1 -2. Slightly smaller right-sided disc protrusion at T2-3. Small central disc protrusion at T3-4.  Sclerotic bony metastatic disease is seen in the upper thoracic spine at several levels. No pathologic fracture. No cord compression in the upper thoracic spine.  CT LUMBAR MYELOGRAM FINDINGS:  Sclerotic metastatic disease is present in the T2 vertebral body. No epidural tumor this level. No pathologic fracture in the lumbar spine. Bilateral renal small calculi without hydronephrosis.  L1-2:  Disc and facet degeneration without significant stenosis  L2-3: Diffuse disc bulging and facet hypertrophy with mild to moderate spinal stenosis  L3-4: Diffuse disc bulging and facet hypertrophy with moderate to severe spinal stenosis  L4-5: Severe spinal stenosis related to extensive disc bulging and facet hypertrophy.  L5-S1: Advanced disc degeneration and spondylosis with foraminal encroachment bilaterally secondary to spurring.  IMPRESSION: Widespread bony metastatic disease throughout the thoracic and lumbar spine without pathologic fracture.  Recent decompression with total laminectomy at T8, T9, and T10. There is soft  tissue filling the surgical bed with displacement of the posterior dura causing cord compression. This process has progressed in extent and severity since the preoperative MRI and presumably represents postop hematoma. Infection and tumor considered less likely.  Lumbar spondylosis with multilevel spinal stenosis. Moderate to severe stenosis at L3-4 and severe spinal stenosis with near complete block of contrast at L4-5.  Electronically Signed: By: Franchot Gallo M.D. On: 02/10/2014 13:14   Dg Myelography Lumbar Inj Thoracic  02/10/2014   ADDENDUM REPORT: 02/10/2014 13:57  ADDENDUM: Critical Value/emergent results were called by telephone at the time of interpretation on 02/10/2014 at 1:50 pm to Dr. Vertell Limber , who verbally acknowledged these results.   Electronically Signed   By: Franchot Gallo M.D.   On: 02/10/2014 13:57   02/10/2014   CLINICAL DATA:  Metastatic prostate cancer. Thoracic laminectomy and decompression 01/29/2014. CT myelogram requested for radiation therapy planning purposes.  FLUOROSCOPY TIME:  3 min 12 seconds  PROCEDURE: LUMBAR PUNCTURE FOR THORACIC AND LUMBAR MYELOGRAM  After thorough discussion of risks and benefits of the procedure including bleeding, infection, injury to nerves, blood vessels, adjacent structures as well as headache and CSF leak, written and oral informed consent was obtained. Consent was obtained by Dr. Franchot Gallo.  I personally performed the lumbar puncture and administered the intrathecal contrast. I also personally supervised acquisition of the myelogram images.  Patient was positioned prone on the fluoroscopy table. Local anesthesia was provided with 1% lidocaine without epinephrine after prepped and draped in the usual sterile fashion. Puncture was performed at L2-3 using a 3 1/2 inch 22-gauge spinal needle via paramedian approach. Using a single pass through the  dura, the needle was placed within the thecal sac, with return of clear CSF. 10 mL of Omnipaque-300  was injected into the thecal sac, with normal opacification of the nerve roots and cauda equina consistent with free flow within the subarachnoid space. The patient was then moved to the trendelenburg position and contrast flowed into the Thoracic spine region.  TECHNIQUE: Contiguous axial images were obtained through the Thoracic and Lumbar spine after the intrathecal infusion of infusion. Coronal and sagittal reconstructions were obtained of the axial image sets.  FINDINGS: THORACIC AND LUMBAR MYELOGRAM FINDINGS:  Diffuse sclerotic metastatic disease is noted throughout the thoracic spine as noted on prior MRI studies. Recent T8 through T10 laminectomy. On the lateral view, there is posterior epidural soft tissue indenting the dura and narrowing the spinal canal significantly. This is touching the cord and there may be cord compression. See separate CT myelogram report. No other areas of spinal stenosis are identified. No intradural mass.  In the lumbar spine, there is bony metastatic disease. There is a complete block at L4-5. Based on the MRI this is related to disc and facet degeneration.  CT THORACIC MYELOGRAM FINDINGS:  Sclerotic metastatic disease is present from T6 through T11. Tumor extends into the posterior elements and ribs at multiple levels. No pathologic fracture. Recent Total laminectomy at T8, T9, and T10 for decompression related to metastatic disease. As noted on the myelogram, the dura is displaced anteriorly due to a posterior soft tissue process extending from T8 through T10. There is compression of the cord. Overall this process has progressed in extent and severity since the preoperative MRI of 01/28/2014. Given the recent laminectomy, this is not likely to represent recurrent tumor but more likely postoperative blood. Infection considered a less likely possibility.  Moderately large right-sided disc protrusion at T1 -2. Slightly smaller right-sided disc protrusion at T2-3. Small central  disc protrusion at T3-4.  Sclerotic bony metastatic disease is seen in the upper thoracic spine at several levels. No pathologic fracture. No cord compression in the upper thoracic spine.  CT LUMBAR MYELOGRAM FINDINGS:  Sclerotic metastatic disease is present in the T2 vertebral body. No epidural tumor this level. No pathologic fracture in the lumbar spine. Bilateral renal small calculi without hydronephrosis.  L1-2:  Disc and facet degeneration without significant stenosis  L2-3: Diffuse disc bulging and facet hypertrophy with mild to moderate spinal stenosis  L3-4: Diffuse disc bulging and facet hypertrophy with moderate to severe spinal stenosis  L4-5: Severe spinal stenosis related to extensive disc bulging and facet hypertrophy.  L5-S1: Advanced disc degeneration and spondylosis with foraminal encroachment bilaterally secondary to spurring.  IMPRESSION: Widespread bony metastatic disease throughout the thoracic and lumbar spine without pathologic fracture.  Recent decompression with total laminectomy at T8, T9, and T10. There is soft tissue filling the surgical bed with displacement of the posterior dura causing cord compression. This process has progressed in extent and severity since the preoperative MRI and presumably represents postop hematoma. Infection and tumor considered less likely.  Lumbar spondylosis with multilevel spinal stenosis. Moderate to severe stenosis at L3-4 and severe spinal stenosis with near complete block of contrast at L4-5.  Electronically Signed: By: Franchot Gallo M.D. On: 02/10/2014 13:14   No results for input(s): WBC, HGB, HCT, PLT in the last 72 hours.  Recent Labs  02/10/14 0516  NA 136*  K 5.0  CL 95*  GLUCOSE 135*  BUN 37*  CREATININE 0.83  CALCIUM 8.4   CBG (last 3)  Recent Labs  02/11/14 1630 02/11/14 2051 02/12/14 0649  GLUCAP 157* 142* 124*    Wt Readings from Last 3 Encounters:  02/11/14 111.358 kg (245 lb 8 oz)  01/27/14 116.3 kg (256 lb 6.3  oz)  01/26/14 120.657 kg (266 lb)    Physical Exam:  Constitutional: He is oriented to person, place, and time. He appears well-developed and well-nourished.  HENT: dentition fair,  Head: Normocephalic and atraumatic.  Eyes: Conjunctivae are normal. Pupils are equal, round, and reactive to light.  Neck: Normal range of motion. Neck supple.  Cardiovascular: Normal rate and regular rhythm.  Respiratory: Effort normal and breath sounds normal. No respiratory distress. He has no wheezes.  GI: Soft. Bowel sounds are normal.  Neurological: He is alert and oriented to person, place, and time.   Decrease in fine motor movements R>LLE. Mild sensory deficits BLE  Improved proprioception in feet especially.  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   UE's 4/5. LE: 4 hf, 4 ke and 4/5 adf/apf. Cognitively intact  Skin: Skin is warm and dry. Back incision clean. Staples out Psychiatric: He has a normal mood and affect. His behavior is normal. Judgment and thought content normal  Assessment/Plan: 1. 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 Physiatrist is providing close team supervision and 24 hour management of active medical problems listed below. Physiatrist and rehab team continue to assess barriers to discharge/monitor patient progress toward functional and medical goals.  FIM: FIM - Bathing Bathing Steps Patient Completed: Chest, Right Arm, Left Arm, Abdomen, Front perineal area, Buttocks, Right upper leg, Left upper leg, Right lower leg (including foot), Left lower leg (including foot) Bathing: 5: Supervision: Safety issues/verbal cues  FIM - Upper Body Dressing/Undressing Upper body dressing/undressing steps patient completed: Thread/unthread right sleeve of pullover shirt/dresss, Thread/unthread left sleeve of pullover shirt/dress,  Put head through opening of pull over shirt/dress, Pull shirt over trunk Upper body dressing/undressing: 5: Set-up assist to: Obtain clothing/put away FIM - Lower Body Dressing/Undressing Lower body dressing/undressing steps patient completed: Thread/unthread right underwear leg, Thread/unthread left underwear leg, Pull underwear up/down, Thread/unthread right pants leg, Thread/unthread left pants leg, Pull pants up/down, Don/Doff right shoe, Fasten/unfasten right shoe, Fasten/unfasten left shoe Lower body dressing/undressing: 4: Min-Patient completed 75 plus % of tasks  FIM - Toileting Toileting steps completed by patient: Adjust clothing prior to toileting Toileting Assistive Devices: Grab bar or rail for support Toileting: 1: Total-Patient completed zero steps, helper did all 3  FIM - Radio producer Devices: Grab bars, Insurance account manager Transfers: 4-To toilet/BSC: Min A (steadying Pt. > 75%), 4-From toilet/BSC: Min A (steadying Pt. > 75%)  FIM - Bed/Chair Transfer Bed/Chair Transfer Assistive Devices: Walker, Arm rests Bed/Chair Transfer: 5: Supine > Sit: Supervision (verbal cues/safety issues), 5: Sit > Supine: Supervision (verbal cues/safety issues), 5: Bed > Chair or W/C: Supervision (verbal cues/safety issues), 5: Chair or W/C > Bed: Supervision (verbal cues/safety issues)  FIM - Locomotion: Wheelchair Distance: 150 Locomotion: Wheelchair: 0: Activity did not occur FIM - Locomotion: Ambulation Locomotion: Ambulation Assistive Devices: Administrator Ambulation/Gait Assistance: 5: Supervision Locomotion: Ambulation: 5: Travels 150 ft or more with supervision/safety issues  Comprehension Comprehension Mode: Auditory Comprehension: 6-Follows complex conversation/direction: With extra time/assistive device  Expression Expression Mode: Verbal Expression: 7-Expresses complex ideas: With no assist  Social Interaction Social Interaction: 7-Interacts  appropriately with others - No medications  needed.  Problem Solving Problem Solving: 5-Solves basic problems: With no assist  Memory Memory: 6-Assistive device: No helper   Medical Problem List and Plan: 1. 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 DVT Prophylaxis/Anticoagulation: SCD's, gait/mobility 3. Pain Management: N/A--denies any discomfort.  4. Mood: LCSW to follow for evaluation and support.  5. Neuropsych: This patient is capable of making decisions on his own behalf. 6. Skin/Wound Care: Routine pressure relief measures. Monitor wound for Healing.  7. Fluids/Electrolytes/Nutrition: Will monitor I/O. good intake. Acute renal insufficiency resolving.   -k+ has been high-normal 8. Reactive leucocytosis: Likely due to surgery as well as steroids.  decadron taper   9. Acute on chronic anemia: Continue iron supplement.  10. Steroid induced hyperglycemia: SSI for elevated BS. Sugars under control 11. Onc---simulation at [facility] on 11/24. CT myelogram to be done here before dc?  -await full plan (and documentation of such) per Dr. Lisbeth Renshaw    LOS (Days) 8 A FACE TO FACE EVALUATION WAS PERFORMED  Clarence Pollard 02/12/2014 8:18 AM

## 2014-02-12 NOTE — Progress Notes (Signed)
Patient refuses to wear CPAP at this time. RT informed patient to call RT if he changes his mind.

## 2014-02-12 NOTE — Plan of Care (Signed)
Problem: SCI BOWEL ELIMINATION Goal: RH STG SCI MANAGE BOWEL WITH MEDICATION WITH ASSISTANCE STG SCI Manage bowel with medication with Mod I assistance.  Outcome: Progressing

## 2014-02-12 NOTE — Plan of Care (Signed)
Problem: RH PAIN MANAGEMENT Goal: RH STG PAIN MANAGED AT OR BELOW PT'S PAIN GOAL <4  Outcome: Progressing No c/o pain     

## 2014-02-12 NOTE — Plan of Care (Signed)
Problem: SCI BOWEL ELIMINATION Goal: RH STG MANAGE BOWEL WITH ASSISTANCE STG Manage Bowel with mod I Assistance.  Outcome: Progressing Goal: RH STG SCI MANAGE BOWEL WITH MEDICATION WITH ASSISTANCE STG SCI Manage bowel with medication with Mod I assistance.  Outcome: Progressing  Problem: RH SKIN INTEGRITY Goal: RH STG ABLE TO PERFORM INCISION/WOUND CARE W/ASSISTANCE STG Able To Perform Incision/Wound Care With Total Assistance from caregiver.  Outcome: Progressing  Problem: RH PAIN MANAGEMENT Goal: RH STG PAIN MANAGED AT OR BELOW PT'S PAIN GOAL < 4  Outcome: Progressing

## 2014-02-12 NOTE — Plan of Care (Signed)
Problem: SCI BOWEL ELIMINATION Goal: RH STG MANAGE BOWEL WITH ASSISTANCE STG Manage Bowel with mod I Assistance.  Outcome: Progressing No report of incontinent episode

## 2014-02-13 ENCOUNTER — Ambulatory Visit: Payer: Medicare Other | Admitting: Oncology

## 2014-02-13 ENCOUNTER — Inpatient Hospital Stay (HOSPITAL_COMMUNITY): Payer: Medicare Other

## 2014-02-13 DIAGNOSIS — E875 Hyperkalemia: Secondary | ICD-10-CM | POA: Diagnosis present

## 2014-02-13 DIAGNOSIS — N19 Unspecified kidney failure: Secondary | ICD-10-CM | POA: Diagnosis present

## 2014-02-13 DIAGNOSIS — R7989 Other specified abnormal findings of blood chemistry: Secondary | ICD-10-CM | POA: Diagnosis present

## 2014-02-13 DIAGNOSIS — N390 Urinary tract infection, site not specified: Secondary | ICD-10-CM | POA: Diagnosis present

## 2014-02-13 DIAGNOSIS — D72829 Elevated white blood cell count, unspecified: Secondary | ICD-10-CM | POA: Diagnosis present

## 2014-02-13 LAB — GLUCOSE, CAPILLARY
GLUCOSE-CAPILLARY: 137 mg/dL — AB (ref 70–99)
Glucose-Capillary: 123 mg/dL — ABNORMAL HIGH (ref 70–99)
Glucose-Capillary: 220 mg/dL — ABNORMAL HIGH (ref 70–99)
Glucose-Capillary: 145 mg/dL — ABNORMAL HIGH (ref 70–99)

## 2014-02-13 MED ORDER — PANTOPRAZOLE SODIUM 40 MG PO TBEC: 40.0000 mg | DELAYED_RELEASE_TABLET | Freq: Every day | ORAL | Status: DC

## 2014-02-13 MED ORDER — FERROUS SULFATE 325 (65 FE) MG PO TABS: 325.0000 mg | ORAL_TABLET | Freq: Two times a day (BID) | ORAL | Status: AC

## 2014-02-13 MED ORDER — PRO-STAT SUGAR FREE PO LIQD: 30.0000 mL | Freq: Three times a day (TID) | ORAL | Status: DC

## 2014-02-13 MED ORDER — FERROUS SULFATE 325 (65 FE) MG PO TABS: 325.0000 mg | ORAL_TABLET | Freq: Two times a day (BID) | ORAL | Status: DC

## 2014-02-13 MED ORDER — DEXAMETHASONE 4 MG PO TABS: 4.0000 mg | ORAL_TABLET | Freq: Two times a day (BID) | ORAL | Status: DC

## 2014-02-13 MED ORDER — BACLOFEN 10 MG PO TABS: 10.0000 mg | ORAL_TABLET | Freq: Two times a day (BID) | ORAL | Status: DC

## 2014-02-13 MED ORDER — INFLUENZA VAC SPLIT QUAD 0.5 ML IM SUSY: 0.5000 mL | PREFILLED_SYRINGE | INTRAMUSCULAR | Status: DC

## 2014-02-13 NOTE — Plan of Care (Signed)
Problem: Consults Goal: RH SPINAL CORD INJURY PATIENT EDUCATION See Patient Education module for education specifics.  Outcome: Completed/Met Date Met:  02/13/14 Goal: Skin Care Protocol Initiated - if Braden Score 18 or less If consults are not indicated, leave blank or document N/A  Outcome: Completed/Met Date Met:  02/13/14  Problem: SCI BOWEL ELIMINATION Goal: RH STG MANAGE BOWEL WITH ASSISTANCE STG Manage Bowel with mod I Assistance.  Outcome: Completed/Met Date Met:  02/13/14 Goal: RH STG SCI MANAGE BOWEL WITH MEDICATION WITH ASSISTANCE STG SCI Manage bowel with medication with Mod I assistance.  Outcome: Completed/Met Date Met:  02/13/14  Problem: RH SKIN INTEGRITY Goal: RH STG ABLE TO PERFORM INCISION/WOUND CARE W/ASSISTANCE STG Able To Perform Incision/Wound Care With Total Assistance from caregiver.  Outcome: Completed/Met Date Met:  02/13/14

## 2014-02-13 NOTE — Progress Notes (Signed)
[place] PHYSICAL MEDICINE & REHABILITATION     PROGRESS NOTE    Subjective/Complaints: Feeling well. improved pain. Excited about progress.  Wants to know if he can have a flu shot. No sob, cough Objective: Vital Signs: Blood pressure 118/64, pulse 89, temperature 98 F (36.7 C), temperature source Oral, resp. rate 18, height 6' (1.829 m), weight 111.358 kg (245 lb 8 oz), SpO2 99 %. No results found. No results for input(s): WBC, HGB, HCT, PLT in the last 72 hours. No results for input(s): NA, K, CL, GLUCOSE, BUN, CREATININE, CALCIUM in the last 72 hours.  Invalid input(s): CO CBG (last 3)   Recent Labs  02/12/14 1616 02/12/14 2119 02/13/14 0642  GLUCAP 204* 173* 137*    Wt Readings from Last 3 Encounters:  02/11/14 111.358 kg (245 lb 8 oz)  01/27/14 116.3 kg (256 lb 6.3 oz)  01/26/14 120.657 kg (266 lb)    Physical Exam:  Constitutional: He is oriented to person, place, and time. He appears well-developed and well-nourished.  HENT: dentition fair,  Head: Normocephalic and atraumatic.  Eyes: Conjunctivae are normal. Pupils are equal, round, and reactive to light.  Neck: Normal range of motion. Neck supple.  Cardiovascular: Normal rate and regular rhythm.  Respiratory: Effort normal and breath sounds normal. No respiratory distress. He has no wheezes.  GI: Soft. Bowel sounds are normal.  Neurological: He is alert and oriented to person, place, and time.   Decrease in fine motor movements R>LLE. Mild sensory deficits BLE  Improved proprioception in feet especially.  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   UE's 4/5. LE: 4 hf, 4 ke and 4/5 adf/apf. Cognitively intact  Skin: Skin is warm and dry. Back incision clean. Staples out Psychiatric: He has a normal mood and affect. His behavior is normal. Judgment and thought content normal  Assessment/Plan: 1. 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 Physiatrist is providing close team supervision and 24 hour management of active medical problems listed below. Physiatrist and rehab team continue to assess barriers to discharge/monitor patient progress toward functional and medical goals.  FIM: FIM - Bathing Bathing Steps Patient Completed: Chest, Right Arm, Left Arm, Abdomen, Front perineal area, Buttocks, Right upper leg, Left upper leg, Right lower leg (including foot), Left lower leg (including foot) Bathing: 5: Supervision: Safety issues/verbal cues  FIM - Upper Body Dressing/Undressing Upper body dressing/undressing steps patient completed: Thread/unthread right sleeve of pullover shirt/dresss, Thread/unthread left sleeve of pullover shirt/dress, Put head through opening of pull over shirt/dress, Pull shirt over trunk Upper body dressing/undressing: 5: Set-up assist to: Obtain clothing/put away FIM - Lower Body Dressing/Undressing Lower body dressing/undressing steps patient completed: Thread/unthread right underwear leg, Thread/unthread left underwear leg, Pull underwear up/down, Thread/unthread right pants leg, Thread/unthread left pants leg, Pull pants up/down, Don/Doff right shoe, Fasten/unfasten right shoe, Fasten/unfasten left shoe Lower body dressing/undressing: 4: Min-Patient completed 75 plus % of tasks  FIM - Toileting Toileting steps completed by patient: Adjust clothing prior to toileting Toileting Assistive Devices: Grab bar or rail for support Toileting: 1: Total-Patient completed zero steps, helper did all 3  FIM - Radio producer Devices: Grab bars, Insurance account manager Transfers: 4-To toilet/BSC: Min A (steadying Pt. > 75%), 4-From toilet/BSC: Min A (steadying Pt. > 75%)  FIM - Bed/Chair Transfer Bed/Chair Transfer Assistive Devices: Walker, Arm rests Bed/Chair Transfer: 5: Supine > Sit:  Supervision  (verbal cues/safety issues), 5: Sit > Supine: Supervision (verbal cues/safety issues), 5: Bed > Chair or W/C: Supervision (verbal cues/safety issues), 5: Chair or W/C > Bed: Supervision (verbal cues/safety issues)  FIM - Locomotion: Wheelchair Distance: 150 Locomotion: Wheelchair: 0: Activity did not occur FIM - Locomotion: Ambulation Locomotion: Ambulation Assistive Devices: Administrator Ambulation/Gait Assistance: 5: Supervision Locomotion: Ambulation: 5: Travels 150 ft or more with supervision/safety issues  Comprehension Comprehension Mode: Auditory Comprehension: 7-Follows complex conversation/direction: With no assist  Expression Expression Mode: Verbal Expression: 7-Expresses complex ideas: With no assist  Social Interaction Social Interaction: 7-Interacts appropriately with others - No medications needed.  Problem Solving Problem Solving: 7-Solves complex problems: Recognizes & self-corrects  Memory Memory: 7-Complete Independence: No helper   Medical Problem List and Plan: 1. 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 DVT Prophylaxis/Anticoagulation: SCD's, gait/mobility 3. Pain Management: N/A--denies any discomfort.  4. Mood: LCSW to follow for evaluation and support.  5. Neuropsych: This patient is capable of making decisions on his own behalf. 6. Skin/Wound Care: Routine pressure relief measures. Monitor wound for Healing.  7. Fluids/Electrolytes/Nutrition: Will monitor I/O. good intake. Acute renal insufficiency resolving.   -k+ has been high-normal 8. Reactive leucocytosis: Likely due to surgery as well as steroids.  decadron taper   9. Acute on chronic anemia: Continue iron supplement.  10. Steroid induced hyperglycemia: SSI for elevated BS. Sugars under control 11. Onc---simulation at [facility] on 11/24. CT myelogram to be done here before dc?  -await full plan  (and documentation of such) per Dr. Lisbeth Renshaw  Flu shot scheduled for tomorrow    LOS (Days) 9 A FACE TO FACE EVALUATION WAS PERFORMED  Clarence Pollard 02/13/2014 8:40 AM

## 2014-02-13 NOTE — Plan of Care (Signed)
Problem: SCI BOWEL ELIMINATION Goal: RH STG MANAGE BOWEL WITH ASSISTANCE STG Manage Bowel with mod I Assistance.  Outcome: Progressing Goal: RH STG SCI MANAGE BOWEL WITH MEDICATION WITH ASSISTANCE STG SCI Manage bowel with medication with Mod I assistance.  Outcome: Progressing  Problem: RH SKIN INTEGRITY Goal: RH STG ABLE TO PERFORM INCISION/WOUND CARE W/ASSISTANCE STG Able To Perform Incision/Wound Care With Total Assistance from caregiver.  Outcome: Progressing  Problem: RH PAIN MANAGEMENT Goal: RH STG PAIN MANAGED AT OR BELOW PT'S PAIN GOAL < 4  Outcome: Completed/Met Date Met:  02/13/14

## 2014-02-13 NOTE — Plan of Care (Signed)
Problem: RH Balance Goal: LTG: Patient will maintain dynamic sitting balance (OT) LTG: Patient will maintain dynamic sitting balance with assistance during activities of daily living (OT)  Outcome: Completed/Met Date Met:  02/13/14 Goal: LTG Patient will maintain dynamic standing with ADLs (OT) LTG: Patient will maintain dynamic standing balance with assist during activities of daily living (OT)  Outcome: Completed/Met Date Met:  02/13/14  Problem: RH Bathing Goal: LTG Patient will bathe with assist, cues/equipment (OT) LTG: Patient will bathe specified number of body parts with assist with/without cues using equipment (position) (OT)  Outcome: Completed/Met Date Met:  02/13/14  Problem: RH Dressing Goal: LTG Patient will perform lower body dressing w/assist (OT) LTG: Patient will perform lower body dressing with assist, with/without cues in positioning using equipment (OT)  Outcome: Completed/Met Date Met:  02/13/14  Problem: RH Toileting Goal: LTG Patient will perform toileting w/assist, cues/equip (OT) LTG: Patient will perform toiletiing (clothes management/hygiene) with assist, with/without cues using equipment (OT)  Outcome: Completed/Met Date Met:  02/13/14  Problem: RH Toilet Transfers Goal: LTG Patient will perform toilet transfers w/assist (OT) LTG: Patient will perform toilet transfers with assist, with/without cues using equipment (OT)  Outcome: Completed/Met Date Met:  02/13/14  Problem: RH Tub/Shower Transfers Goal: LTG Patient will perform tub/shower transfers w/assist (OT) LTG: Patient will perform tub/shower transfers with assist, with/without cues using equipment (OT)  Outcome: Completed/Met Date Met:  02/13/14

## 2014-02-13 NOTE — Progress Notes (Signed)
Physical Therapy Discharge Summary  Patient Details  Name: Clarence Pollard MRN: [id number] Date of Birth: 01-27-1941  Today's Date: 02/13/2014 PT Individual Time: Treatment Session 1: 0900-1000; Treatment Session 2: 1300-1400 PT Individual Time Calculation (min): Treatment Session 1: 60 min ; Treatment Session 2: 45mn   Patient has met 8 of 9 long term goals due to improved activity tolerance, improved balance, improved postural control, increased strength, ability to compensate for deficits, functional use of  right lower extremity and left lower extremity and improved coordination.  Patient to discharge at a mod(I) ambulatory level with use of RW. Pt's daughter able to provide safe min A for stair negotiation upon d/c.   Reasons goals not met: Pt continues to req min A for stair negotiation for overall safety, therefore did not meet target level of mod(I).   Recommendation:  Patient will benefit from ongoing skilled PT services in home health setting to continue to advance safe functional mobility, address ongoing impairments in decreased functional endurance, decreased balance, decreased strength, decreased overall safety during transfers and mobility, and minimize fall risk.  Equipment: No equipment provided. Pt reports that he owns a RW and transport w/c.   Reasons for discharge: treatment goals met and discharge from hospital  Patient/family agrees with progress made and goals achieved: Yes   Skilled Therapeutic Interventions Treatment Session 1:  1:1. Pt received sitting in recliner lightly sleeping, but easy to wake. Focus this session on functional endurance, safety during functional transfers and mobility as well as d/c planning. Pt able to verbalize 3/3 back precautions without cues. Pt able to demonstrate all transfers (bed, furniture and car), bed mobility, ambulation in controlled/household environments up to 250' with [facility]. Pt continues to demonstrate very  slow, but steady ambulatory pace with overall flexed posture.    Pt educated on d/c process, safety in home environment, fall prevention, energy conservation as well as goals/benefits of [facility]. Pt verbalized understanding.  Pt left sitting in recliner at end of session w/ all needs in reach.   Treatment Session 2:  1:1. Pt received sitting in recliner, ready for therapy. Focus this session on functional endurance, safety during functional transfers and mobility and standing balance. Pt mod(I) for ambulation 200'x2 and 100'x1 on unit as well as 275'x1 off unit in community environment including on/off elevator and in busy hospital hallways with use of RW. Pt mod(I) for toileting this session. Pt req min A to negotiate up/down 2 steps w/ use of RW to mimic entry into his home environment (if needed), using step-to pattern ascending forwards and descending backwards. Pt also req min A for negotiation up/down 5 steps with B UE on single rail using step-to pattern. Pt req mod cues overall for safety and technique.   TUG reassessed, pt able to safely complete TUG in 24 seconds, demonstrating improved time from 28seconds approx. 1 week ago, however, score indicates that pt continues to remain at high risk for fall. Berg Balance Test also reassessed, pt scored 14/56, demonstrating improved MCID score from 7/56 also from 1 week ago. Berg Balance test score also indicates that pt remains at high risk for fall. Reviewed home safety, fall prevention as well as strong emphasis on continued need for RW to decrease fall risk and increase level of functional independence. Pt verbalized understanding.   Pt left sitting in recliner at end of session w/ all needs in reach. Pt made mod(I) in room earlier in day by OT, RN aware.  Pt's daughter not present during session, but therapist later verbally educating and providing demonstrating of safe stair negotiation techniques with use of rail or RW. Daughter further educated  on pt's current level of function, mod(I) with use of RW, home safety, residual impairments and goals/benefits of HH PT. Daughter verbalized understanding.   PT Discharge Precautions/Restrictions Precautions Precautions: Fall;Back Precaution Booklet Issued: No Restrictions Weight Bearing Restrictions: No Vital Signs Therapy Vitals Temp: 98.3 F (36.8 C) Temp Source: Oral Pulse Rate: 97 Resp: 18 BP: 114/69 mmHg Patient Position (if appropriate): Sitting Oxygen Therapy SpO2: 98 % O2 Device: Not Delivered Pain Pain Assessment Pain Assessment: No/denies pain Vision/Perception  See OT discharge Cognition Overall Cognitive Status: Within Functional Limits for tasks assessed Arousal/Alertness: Awake/alert Orientation Level: Oriented X4 Attention: Alternating;Selective Selective Attention: Appears intact Alternating Attention: Appears intact Memory: Appears intact Awareness: Appears intact Problem Solving: Appears intact Safety/Judgment: Appears intact Sensation Sensation Light Touch: Impaired by gross assessment Light Touch Impaired Details: Impaired LLE;Impaired RLE Stereognosis: Appears Intact Hot/Cold: Appears Intact Proprioception: Impaired by gross assessment Proprioception Impaired Details: Impaired RLE;Impaired LLE Additional Comments: mildly impaired B LE Coordination Gross Motor Movements are Fluid and Coordinated: Yes Fine Motor Movements are Fluid and Coordinated: Yes Motor  Motor Motor: Ataxia;Abnormal postural alignment and control Motor - Skilled Clinical Observations: slouched sitting posture Motor - Discharge Observations: generalized weakness  Mobility Bed Mobility Bed Mobility: Right Sidelying to Sit;Sit to Sidelying Right Rolling Right: 6: Modified independent (Device/Increase time) Rolling Left: 6: Modified independent (Device/Increase time) Right Sidelying to Sit: 6: Modified independent (Device/Increase time) Supine to Sit: 6: Modified  independent (Device/Increase time) Sitting - Scoot to Edge of Bed: 6: Modified independent (Device/Increase time) Sit to Supine: 6: Modified independent (Device/Increase time) Sit to Sidelying Right: 6: Modified independent (Device/Increase time) Transfers Transfers: Yes Sit to Stand: 6: Modified independent (Device/Increase time) Stand to Sit: 6: Modified independent (Device/Increase time) Stand Pivot Transfers: 6: Modified independent (Device/Increase time) Locomotion  Ambulation Ambulation: Yes Ambulation/Gait Assistance: 6: Modified independent (Device/Increase time) Ambulation Distance (Feet): 200 Feet Assistive device: Rolling walker Gait Gait: Yes Gait Pattern: Impaired Gait Pattern: Step-through pattern;Decreased stride length;Narrow base of support;Trunk flexed Stairs / Additional Locomotion Stairs: Yes Stairs Assistance: 4: Min assist Stairs Assistance Details: Verbal cues for precautions/safety;Verbal cues for sequencing;Verbal cues for technique;Verbal cues for safe use of DME/AE Stair Management Technique: One rail Right;With walker;Backwards;Forwards Number of Stairs: 5 Wheelchair Mobility Wheelchair Mobility: No (Pt at functionally ambulatory level)  Trunk/Postural Assessment  Cervical Assessment Cervical Assessment: Exceptions to [facility] Cervical AROM Overall Cervical AROM: Deficits;Due to premorid status Overall Cervical AROM Comments: forward head and neck down Thoracic Assessment Thoracic Assessment: Exceptions to [facility] Thoracic AROM Overall Thoracic AROM: Deficits;Due to precautions Overall Thoracic AROM Comments: spinal precautions Lumbar Assessment Lumbar Assessment: Within Functional Limits Postural Control Postural Control: Deficits on evaluation Postural Limitations: generalized slouched sitting posture from premorbid status  Balance Balance Balance Assessed: Yes Standardized Balance Assessment Standardized Balance Assessment: Timed Up and Go  Test;Berg Balance Test Berg Balance Test Sit to Stand: Able to stand using hands after several tries Standing Unsupported: Able to stand 30 seconds unsupported Sitting with Back Unsupported but Feet Supported on Floor or Stool: Able to sit safely and securely 2 minutes Stand to Sit: Controls descent by using hands Transfers: Able to transfer with verbal cueing and /or supervision Standing Unsupported with Eyes Closed: Needs help to keep from falling Standing Ubsupported with Feet Together: Needs help to attain position and unable to hold for 15  seconds From Standing, Reach Forward with Outstretched Arm: Reaches forward but needs supervision From Standing Position, Pick up Object from Floor: Unable to try/needs assist to keep balance (Unable to attempt due to back precautions) From Standing Position, Turn to Look Behind Over each Shoulder: Needs assist to keep from losing balance and falling (Unable to attempt due to back precautions) Turn 360 Degrees: Needs assistance while turning Standing Unsupported, Alternately Place Feet on Step/Stool: Needs assistance to keep from falling or unable to try Standing Unsupported, One Foot in Front: Loses balance while stepping or standing Standing on One Leg: Unable to try or needs assist to prevent fall Total Score: 14 Timed Up and Go Test TUG: Normal TUG Normal TUG (seconds): 24 Static Sitting Balance Static Sitting - Balance Support: Feet supported Static Sitting - Level of Assistance: 6: Modified independent (Device/Increase time) Dynamic Sitting Balance Dynamic Sitting - Balance Support: Feet supported;During functional activity Dynamic Sitting - Level of Assistance: 6: Modified independent (Device/Increase time) Dynamic Sitting - Balance Activities: Reaching for objects;Lateral lean/weight shifting;Forward lean/weight shifting Static Standing Balance Static Standing - Balance Support: Bilateral upper extremity supported Static Standing - Level  of Assistance: 6: Modified independent (Device/Increase time) Dynamic Standing Balance Dynamic Standing - Balance Support: Left upper extremity supported;Right upper extremity supported;Bilateral upper extremity supported Dynamic Standing - Level of Assistance: 6: Modified independent (Device/Increase time) Dynamic Standing - Balance Activities: Lateral lean/weight shifting;Forward lean/weight shifting;Reaching for objects Dynamic Standing - Comments: with mod(I) Extremity Assessment  RUE Assessment RUE Assessment: Within Functional Limits LUE Assessment LUE Assessment: Within Functional Limits RLE Assessment RLE Assessment: Exceptions to [facility] RLE Strength RLE Overall Strength: Deficits RLE Overall Strength Comments: grossly 4/5 LLE Assessment LLE Assessment: Exceptions to [facility] LLE Strength LLE Overall Strength: Deficits LLE Overall Strength Comments: Grossly 4/5  See FIM for current functional status  Gilmore Laroche 02/13/2014, 5:22 PM

## 2014-02-13 NOTE — Discharge Summary (Signed)
Physician Discharge Summary  Patient ID: Clarence Pollard MRN: [id number] DOB/AGE: 72-Oct-1942 72 y.o.  Admit date: 02/04/2014 Discharge date: 02/14/2014  Discharge Diagnoses:  Principal Problem:   Prostate cancer metastatic to bone Active Problems:   Spinal cord compression   Paraparesis of both lower limbs   Hyperkalemia   Acute prerenal azotemia   Leucocytosis   UTI (urinary tract infection)   Discharged Condition: Stable.    Significant Diagnostic Studies: Ct Thoracic Spine W Contrast  02/10/2014   ADDENDUM REPORT: 02/10/2014 13:57  ADDENDUM: Critical Value/emergent results were called by telephone at the time of interpretation on 02/10/2014 at 1:50 pm to Dr. Vertell Limber , who verbally acknowledged these results.   Electronically Signed   By: Franchot Gallo M.D.   On: 02/10/2014 13:57   02/10/2014   CLINICAL DATA:  Metastatic prostate cancer. Thoracic laminectomy and decompression 01/29/2014. CT myelogram requested for radiation therapy planning purposes.  FLUOROSCOPY TIME:  3 min 12 seconds  PROCEDURE: LUMBAR PUNCTURE FOR THORACIC AND LUMBAR MYELOGRAM  After thorough discussion of risks and benefits of the procedure including bleeding, infection, injury to nerves, blood vessels, adjacent structures as well as headache and CSF leak, written and oral informed consent was obtained. Consent was obtained by Dr. Franchot Gallo.  I personally performed the lumbar puncture and administered the intrathecal contrast. I also personally supervised acquisition of the myelogram images.  Patient was positioned prone on the fluoroscopy table. Local anesthesia was provided with 1% lidocaine without epinephrine after prepped and draped in the usual sterile fashion. Puncture was performed at L2-3 using a 3 1/2 inch 22-gauge spinal needle via paramedian approach. Using a single pass through the dura, the needle was placed within the thecal sac, with return of clear CSF. 10 mL of Omnipaque-300 was injected into  the thecal sac, with normal opacification of the nerve roots and cauda equina consistent with free flow within the subarachnoid space. The patient was then moved to the trendelenburg position and contrast flowed into the Thoracic spine region.  TECHNIQUE: Contiguous axial images were obtained through the Thoracic and Lumbar spine after the intrathecal infusion of infusion. Coronal and sagittal reconstructions were obtained of the axial image sets.  FINDINGS: THORACIC AND LUMBAR MYELOGRAM FINDINGS:  Diffuse sclerotic metastatic disease is noted throughout the thoracic spine as noted on prior MRI studies. Recent T8 through T10 laminectomy. On the lateral view, there is posterior epidural soft tissue indenting the dura and narrowing the spinal canal significantly. This is touching the cord and there may be cord compression. See separate CT myelogram report. No other areas of spinal stenosis are identified. No intradural mass.  In the lumbar spine, there is bony metastatic disease. There is a complete block at L4-5. Based on the MRI this is related to disc and facet degeneration.  CT THORACIC MYELOGRAM FINDINGS:  Sclerotic metastatic disease is present from T6 through T11. Tumor extends into the posterior elements and ribs at multiple levels. No pathologic fracture. Recent Total laminectomy at T8, T9, and T10 for decompression related to metastatic disease. As noted on the myelogram, the dura is displaced anteriorly due to a posterior soft tissue process extending from T8 through T10. There is compression of the cord. Overall this process has progressed in extent and severity since the preoperative MRI of 01/28/2014. Given the recent laminectomy, this is not likely to represent recurrent tumor but more likely postoperative blood. Infection considered a less likely possibility.  Moderately large right-sided disc protrusion at T1 -2. Slightly  smaller right-sided disc protrusion at T2-3. Small central disc protrusion at  T3-4.  Sclerotic bony metastatic disease is seen in the upper thoracic spine at several levels. No pathologic fracture. No cord compression in the upper thoracic spine.  CT LUMBAR MYELOGRAM FINDINGS:  Sclerotic metastatic disease is present in the T2 vertebral body. No epidural tumor this level. No pathologic fracture in the lumbar spine. Bilateral renal small calculi without hydronephrosis.  L1-2:  Disc and facet degeneration without significant stenosis  L2-3: Diffuse disc bulging and facet hypertrophy with mild to moderate spinal stenosis  L3-4: Diffuse disc bulging and facet hypertrophy with moderate to severe spinal stenosis  L4-5: Severe spinal stenosis related to extensive disc bulging and facet hypertrophy.  L5-S1: Advanced disc degeneration and spondylosis with foraminal encroachment bilaterally secondary to spurring.  IMPRESSION: Widespread bony metastatic disease throughout the thoracic and lumbar spine without pathologic fracture.  Recent decompression with total laminectomy at T8, T9, and T10. There is soft tissue filling the surgical bed with displacement of the posterior dura causing cord compression. This process has progressed in extent and severity since the preoperative MRI and presumably represents postop hematoma. Infection and tumor considered less likely.  Lumbar spondylosis with multilevel spinal stenosis. Moderate to severe stenosis at L3-4 and severe spinal stenosis with near complete block of contrast at L4-5.  Electronically Signed: By: Franchot Gallo M.D. On: 02/10/2014 13:14   Dg Myelography Lumbar Inj Thoracic  02/10/2014   ADDENDUM REPORT: 02/10/2014 13:57  ADDENDUM: Critical Value/emergent results were called by telephone at the time of interpretation on 02/10/2014 at 1:50 pm to Dr. Vertell Limber , who verbally acknowledged these results.   Electronically Signed   By: Franchot Gallo M.D.   On: 02/10/2014 13:57   02/10/2014   CLINICAL DATA:  Metastatic prostate cancer. Thoracic  laminectomy and decompression 01/29/2014. CT myelogram requested for radiation therapy planning purposes.  FLUOROSCOPY TIME:  3 min 12 seconds  PROCEDURE: LUMBAR PUNCTURE FOR THORACIC AND LUMBAR MYELOGRAM  After thorough discussion of risks and benefits of the procedure including bleeding, infection, injury to nerves, blood vessels, adjacent structures as well as headache and CSF leak, written and oral informed consent was obtained. Consent was obtained by Dr. Franchot Gallo.  I personally performed the lumbar puncture and administered the intrathecal contrast. I also personally supervised acquisition of the myelogram images.  Patient was positioned prone on the fluoroscopy table. Local anesthesia was provided with 1% lidocaine without epinephrine after prepped and draped in the usual sterile fashion. Puncture was performed at L2-3 using a 3 1/2 inch 22-gauge spinal needle via paramedian approach. Using a single pass through the dura, the needle was placed within the thecal sac, with return of clear CSF. 10 mL of Omnipaque-300 was injected into the thecal sac, with normal opacification of the nerve roots and cauda equina consistent with free flow within the subarachnoid space. The patient was then moved to the trendelenburg position and contrast flowed into the Thoracic spine region.  TECHNIQUE: Contiguous axial images were obtained through the Thoracic and Lumbar spine after the intrathecal infusion of infusion. Coronal and sagittal reconstructions were obtained of the axial image sets.  FINDINGS: THORACIC AND LUMBAR MYELOGRAM FINDINGS:  Diffuse sclerotic metastatic disease is noted throughout the thoracic spine as noted on prior MRI studies. Recent T8 through T10 laminectomy. On the lateral view, there is posterior epidural soft tissue indenting the dura and narrowing the spinal canal significantly. This is touching the cord and there may be cord  compression. See separate CT myelogram report. No other areas of  spinal stenosis are identified. No intradural mass.  In the lumbar spine, there is bony metastatic disease. There is a complete block at L4-5. Based on the MRI this is related to disc and facet degeneration.  CT THORACIC MYELOGRAM FINDINGS:  Sclerotic metastatic disease is present from T6 through T11. Tumor extends into the posterior elements and ribs at multiple levels. No pathologic fracture. Recent Total laminectomy at T8, T9, and T10 for decompression related to metastatic disease. As noted on the myelogram, the dura is displaced anteriorly due to a posterior soft tissue process extending from T8 through T10. There is compression of the cord. Overall this process has progressed in extent and severity since the preoperative MRI of 01/28/2014. Given the recent laminectomy, this is not likely to represent recurrent tumor but more likely postoperative blood. Infection considered a less likely possibility.  Moderately large right-sided disc protrusion at T1 -2. Slightly smaller right-sided disc protrusion at T2-3. Small central disc protrusion at T3-4.  Sclerotic bony metastatic disease is seen in the upper thoracic spine at several levels. No pathologic fracture. No cord compression in the upper thoracic spine.  CT LUMBAR MYELOGRAM FINDINGS:  Sclerotic metastatic disease is present in the T2 vertebral body. No epidural tumor this level. No pathologic fracture in the lumbar spine. Bilateral renal small calculi without hydronephrosis.  L1-2:  Disc and facet degeneration without significant stenosis  L2-3: Diffuse disc bulging and facet hypertrophy with mild to moderate spinal stenosis  L3-4: Diffuse disc bulging and facet hypertrophy with moderate to severe spinal stenosis  L4-5: Severe spinal stenosis related to extensive disc bulging and facet hypertrophy.  L5-S1: Advanced disc degeneration and spondylosis with foraminal encroachment bilaterally secondary to spurring.  IMPRESSION: Widespread bony metastatic disease  throughout the thoracic and lumbar spine without pathologic fracture.  Recent decompression with total laminectomy at T8, T9, and T10. There is soft tissue filling the surgical bed with displacement of the posterior dura causing cord compression. This process has progressed in extent and severity since the preoperative MRI and presumably represents postop hematoma. Infection and tumor considered less likely.  Lumbar spondylosis with multilevel spinal stenosis. Moderate to severe stenosis at L3-4 and severe spinal stenosis with near complete block of contrast at L4-5.  Electronically Signed: By: Franchot Gallo M.D. On: 02/10/2014 13:14    Labs:  Basic Metabolic Panel:  Recent Labs Lab 02/10/14 0516  NA 136*  K 5.0  CL 95*  CO2 27  GLUCOSE 135*  BUN 37*  CREATININE 0.83  CALCIUM 8.4    CBC: No results for input(s): WBC, NEUTROABS, HGB, HCT, MCV, PLT in the last 168 hours.  CBG:  Recent Labs Lab 02/12/14 1134 02/12/14 1616 02/12/14 2119 02/13/14 0642 02/13/14 1135  GLUCAP 141* 204* 173* 137* 123*    Brief HPI:   Clarence Pollard is a 73 y.o. male with history of prostate cancer with mets to spine and palliative radiation to T7-T12 vertebral bodies who was  admitted on 01/26/14 with progressive weakness BLE with increased pain from lower thoracic spine to BLE with numbness and difficulty walking. MRI of spine with increasing epidural confluent tumor from T8 - T10 resulting in severe canal stenosis and cord compression at T9, diffuse osseous mets as well as severe lumbar spondylosis with canal stenosis L5-S1 and neural foraminal narrowing L2/3- L5/S1. He was started on IV steroids and underwent T8-T10 thoracic laminectomy with resection of tumor and decompression of  spinal cord on 01/29/14. He has had improvement in symptoms post op and CIR was recommended for follow up therapy.     Hospital Course: Clarence Pollard was admitted to rehab 02/04/2014 for inpatient therapies to consist  of PT and OT at least three hours five days a week. Past admission physiatrist, therapy team and rehab RN have worked together to provide customized collaborative inpatient rehab. Blood pressures have been stable and patient has been afebrile during his stay. Back incision has been monitored daily and has been healing well without signs or symptoms of infection. Po intake has been good and he's continent of bowel and bladder. Follow up labs revealed pre-renal azotemia and patient was encouraged to push po fluids. Hyperkalemia has resolved. CBC reveals that H/H is slowly improving and he's to continue on iron supplement bid. Urine culture at admission revealed proteus UTI and he was treated with three day course of Cipro.   Dr. Lisbeth Renshaw has followed up for input on XRT and patient underwent thoracic/lumbar CT with myelogram prior to simulation. X rays revealed post op fluid collection consistent with hematoma and patient without any symptoms at this time.  Dr. Vertell Limber informed of results by radiology. Patient has continued to make good gains and is modified independent at discharge. He will continue to receive follow up HHPT past discharge. Family has elected on [facility] and patient was discharged to this facility on 02/14/14   Rehab course: During patient's stay in rehab weekly team conferences were held to monitor patient's progress, set goals and discuss barriers to discharge.  Patient has had improvement in activity tolerance, balance, postural control, as well as ability to compensate for deficits. He is modified independent for bathing and dressing tasks with use of AE. He is modified independent for all aspects of mobility and no family education done as patient discharged to [facility] who will provide care as needed past discharge.     Disposition:  Assisted Living facility.   Diet: Carb modified diet  Special Instructions: 1. Watch sweets and starches while on decadron. May  resume regular diet once decadron is discontinued.  2. Follow up with Dr. Lisbeth Renshaw next week for radiation.  3. No driving.      Medication List    STOP taking these medications        ibuprofen 600 MG tablet  Commonly known as:  ADVIL,MOTRIN      TAKE these medications        baclofen 10 MG tablet  Commonly known as:  LIORESAL  Take 1 tablet (10 mg total) by mouth 2 (two) times daily.     benztropine 0.5 MG tablet  Commonly known as:  COGENTIN  Take 0.5 mg by mouth every 12 (twelve) hours.     bicalutamide 50 MG tablet  Commonly known as:  CASODEX  Take 50 mg by mouth daily.     brimonidine 0.1 % Soln  Commonly known as:  ALPHAGAN P  Apply 1 drop to eye at bedtime. Both eyes     calcium-vitamin D 500-200 MG-UNIT per tablet  Take 1 tablet by mouth 2 (two) times daily with a meal.     dexamethasone 4 MG tablet  Commonly known as:  DECADRON  Take 1 tablet (4 mg total) by mouth every 12 (twelve) hours.     ergocalciferol 50000 UNITS capsule  Commonly known as:  VITAMIN D2  Take 50,000 Units by mouth once a week. Every Saturday.  feeding supplement (PRO-STAT SUGAR FREE 64) Liqd  Take 30 mLs by mouth 3 (three) times daily with meals.     ferrous sulfate 325 (65 FE) MG tablet  Take 1 tablet (325 mg total) by mouth 2 (two) times daily with a meal.     pantoprazole 40 MG tablet  Commonly known as:  PROTONIX  Take 1 tablet (40 mg total) by mouth daily. Take while on decadron     travoprost (benzalkonium) 0.004 % ophthalmic solution  Commonly known as:  TRAVATAN  Place 1 drop into both eyes at bedtime.     traZODone 25 mg Tabs tablet  Commonly known as:  DESYREL  Take 0.5 tablets (25 mg total) by mouth at bedtime as needed for sleep.       Follow-up Information    Follow up with Meredith Staggers, MD On 03/25/2014.   Specialty:  Physical Medicine and Rehabilitation   Why:  Be there at 10:15 for  10:40 am  appointment   Contact information:   [address], [place] [address] [phone]       Follow up with Hosie Spangle, MD. Call today.   Specialty:  Neurosurgery   Why:  for post op check.    Contact information:   [address] [place] [address] [phone]       Follow up with Marye Round, MD.   Specialty:  Radiation Oncology   Why:  follow up for radiation   Contact information:   [place] [place] [address] [phone]       Follow up with [name], MD. Call today.   Specialty:  Family Medicine   Why:  for post hospital follow up   Contact information:   [place] [address] [phone]       Signed: Bary Leriche 02/13/2014, 4:09 PM

## 2014-02-13 NOTE — Discharge Instructions (Signed)
Inpatient Rehab Discharge Instructions  [place] Discharge date and time: 02/14/14   Activities/Precautions/ Functional Status: Activity: no lifting, driving, or strenuous exercise till  cleared by MD.  Diet: diabetic diet Wound Care: keep wound clean and dry Functional status:  ___ No restrictions     ___ Walk up steps independently ___ 24/7 supervision/assistance   ___ Walk up steps with assistance _X__ Intermittent supervision/assistance  _X__ Bathe/dress independently _X__ Walk with walker    ___ Bathe/dress with assistance ___ Walk Independently    ___ Shower independently ___ Walk with assistance    ___ Shower with assistance _X__ No alcohol     ___ Return to work/school ________  Special Instructions:    My questions have been answered and I understand these instructions. I will adhere to these goals and the provided educational materials after my discharge from the hospital.  Patient/Caregiver Signature _______________________________ Date __________  Clinician Signature _______________________________________ Date __________  Please bring this form and your medication list with you to all your follow-up doctor's appointments.

## 2014-02-13 NOTE — Progress Notes (Signed)
Social Work Patient ID: Clarence Pollard, male   DOB: 03-01-1941, 73 y.o.   MRN: [id number]   Have spoken with pt, daughter and [facility] rep with all confirmed that pt ready for d/c tomorrow directly into ALF apartment.  Will provide MD order for PT at facility.    Rozell Kettlewell, LCSW

## 2014-02-13 NOTE — Progress Notes (Signed)
Occupational Therapy Session and Discharge Summary  Patient Details  Name: Clarence Pollard MRN: [id number] Date of Birth: [birth year]  Today's Date: 02/13/2014 OT Individual Time: 1030-1130 OT Individual Time Calculation (min): 60 min   Skilled Intervention: ADL-retraining with emphasis on discharge planning, pt ed on graduation day activities planned: reassessment of performance of BADL to include transfers, functional mobility without cues or supervision, safety awareness and maintenance of back precautions using AE.   Pt verbalized understanding and used RW to ambulate to gather clothing and perform simulation of toileting followed by bathing and dressing.   Pt demo'd mild LOB while retrieving shirt from closet d/t narrow closet doorway interfering with reach.   Pt recovered balance independently and proceeded through tasks without assistance required, maintaining back precautions as previously instructed.    Pt reported satisfaction with his progress and stated that his bed and furniture have already been moved to [place] by his daughter.   Pt will reside in 1 BR apartment that features only a sink, kitchen cabinet and apartment-sized refrigerator.   Homemaking is provided by facility to include change of linens, room cleaning and laundry.    Pt intends to participate in group exercises and outings s/p discharge, as feasible.   Pt dressed at edge of bed and transferred to recliner to await his lunch with all needs within reach at end of session.   Patient has met 7 of 7 long term goals due to improved activity tolerance, improved balance and improved awareness. Patient to discharge at overall Modified Independent level.   Pt demonstrated proficiency with use of LH sponge, LH shoe horn, reacher, shoe posts and sock aid to maintain back precautions during BADL.   Pt reports successful transition to living at ALF with assist from daughter.  Reasons goals not met: n/a  Recommendation:   Patient will benefit from ongoing skilled PT services at [place] to continue to advance functional skills in the area of functional mobility, dynamic standing balance, endurance.  Equipment: No equipment provided  Reasons for discharge: treatment goals met  Patient/family agrees with progress made and goals achieved: Yes  OT Discharge Precautions/Restrictions  Precautions Precautions: Fall;Back Restrictions Weight Bearing Restrictions: No  Vital Signs Therapy Vitals Temp: 98.3 F (36.8 C) Temp Source: Oral Pulse Rate: 97 Resp: 18 BP: 114/69 mmHg Patient Position (if appropriate): Sitting Oxygen Therapy SpO2: 98 % O2 Device: Not Delivered  Pain Pain Assessment Pain Assessment: No/denies pain  ADL ADL ADL Comments: see FIM  Vision/Perception  Vision- History Baseline Vision/History: Wears glasses Wears Glasses: At all times Patient Visual Report: No change from baseline Vision- Assessment Vision Assessment?: No apparent visual deficits   Cognition Overall Cognitive Status: Within Functional Limits for tasks assessed Arousal/Alertness: Awake/alert Orientation Level: Oriented X4 Attention: Alternating Alternating Attention: Appears intact Memory: Appears intact Awareness: Appears intact Problem Solving: Appears intact Safety/Judgment: Appears intact  Sensation Sensation Light Touch: Appears Intact;Impaired by gross assessment;Impaired Detail Light Touch Impaired Details: Impaired LLE;Impaired RLE Stereognosis: Appears Intact Hot/Cold: Appears Intact Proprioception: Appears Intact;Impaired Detail Proprioception Impaired Details: Impaired RLE Additional Comments: WFL at [place], mildly impaired RLE Coordination Gross Motor Movements are Fluid and Coordinated: Yes Fine Motor Movements are Fluid and Coordinated: Yes  Motor  Motor Motor: Ataxia;Abnormal postural alignment and control Motor - Skilled Clinical Observations: slouched sitting  posture  Mobility  Bed Mobility Bed Mobility: Rolling Right;Rolling Left;Supine to Sit;Sit to Supine;Sitting - Scoot to Edge of Bed Rolling Right: 6: Modified independent (Device/Increase time) Rolling  Left: 6: Modified independent (Device/Increase time) Right Sidelying to Sit: 6: Modified independent (Device/Increase time) Supine to Sit: 6: Modified independent (Device/Increase time) Sitting - Scoot to Edge of Bed: 6: Modified independent (Device/Increase time) Sit to Supine: 6: Modified independent (Device/Increase time) Transfers Transfers: Sit to Stand;Stand to Sit Sit to Stand: 6: Modified independent (Device/Increase time) Stand to Sit: 6: Modified independent (Device/Increase time)   Trunk/Postural Assessment  Cervical Assessment Cervical Assessment: Exceptions to [facility] Cervical AROM Overall Cervical AROM: Deficits;Due to premorid status Overall Cervical AROM Comments: forward head and neck down Thoracic Assessment Thoracic Assessment: Exceptions to [facility] Thoracic AROM Overall Thoracic AROM: Deficits;Due to precautions Overall Thoracic AROM Comments: spinal precautions Lumbar Assessment Lumbar Assessment: Within Functional Limits Postural Control Postural Control: Deficits on evaluation Postural Limitations: generalized slouched sitting posture from premorbid status   Balance Balance Balance Assessed: Yes Standardized Balance Assessment Standardized Balance Assessment: Timed Up and Go Test;Berg Balance Test Berg Balance Test Sit to Stand: Needs minimal aid to stand or to stabilize Standing Unsupported: Able to stand 30 seconds unsupported Sitting with Back Unsupported but Feet Supported on Floor or Stool: Able to sit safely and securely 2 minutes Stand to Sit: Uses backs of legs against chair to control descent Transfers: Needs one person to assist Standing Unsupported with Eyes Closed: Needs help to keep from falling Standing Ubsupported with Feet Together: Needs help  to attain position and unable to hold for 15 seconds From Standing, Reach Forward with Outstretched Arm: Loses balance while trying/requires external support From Standing Position, Pick up Object from Floor: Unable to try/needs assist to keep balance (Unable due to back precautions) From Standing Position, Turn to Look Behind Over each Shoulder: Needs assist to keep from losing balance and falling (Unable due to back precautions) Turn 360 Degrees: Needs assistance while turning Standing Unsupported, Alternately Place Feet on Step/Stool: Needs assistance to keep from falling or unable to try Standing Unsupported, One Foot in Front: Loses balance while stepping or standing Standing on One Leg: Unable to try or needs assist to prevent fall Total Score: 10 Timed Up and Go Test TUG: Normal TUG Normal TUG (seconds): 24  Extremity/Trunk Assessment RUE Assessment RUE Assessment: Within Functional Limits LUE Assessment LUE Assessment: Within Functional Limits  See FIM for current functional status  Winner Valeriano 02/13/2014, 3:08 PM

## 2014-02-13 NOTE — Plan of Care (Signed)
Problem: RH Balance Goal: LTG Patient will maintain dynamic standing balance (PT) LTG: Patient will maintain dynamic standing balance with assistance during mobility activities (PT)  Outcome: Completed/Met Date Met:  02/13/14  Problem: RH Bed Mobility Goal: LTG Patient will perform bed mobility with assist (PT) LTG: Patient will perform bed mobility with assistance, with/without cues (PT).  Outcome: Completed/Met Date Met:  02/13/14  Problem: RH Bed to Chair Transfers Goal: LTG Patient will perform bed/chair transfers w/assist (PT) LTG: Patient will perform bed/chair transfers with assistance, with/without cues (PT).  Outcome: Completed/Met Date Met:  02/13/14  Problem: RH Car Transfers Goal: LTG Patient will perform car transfers with assist (PT) LTG: Patient will perform car transfers with assistance (PT).  Outcome: Completed/Met Date Met:  02/13/14  Problem: RH Furniture Transfers Goal: LTG Patient will perform furniture transfers w/assist (OT/PT LTG: Patient will perform furniture transfers with assistance (OT/PT).  Outcome: Completed/Met Date Met:  02/13/14  Problem: RH Ambulation Goal: LTG Patient will ambulate in controlled environment (PT) LTG: Patient will ambulate in a controlled environment, # of feet with assistance (PT).  Outcome: Completed/Met Date Met:  02/13/14 Goal: LTG Patient will ambulate in home environment (PT) LTG: Patient will ambulate in home environment, # of feet with assistance (PT).  Outcome: Completed/Met Date Met:  02/13/14 Goal: LTG Patient will ambulate in community environment (PT) LTG: Patient will ambulate in community environment, # of feet with assistance (PT).  Outcome: Completed/Met Date Met:  02/13/14  Problem: RH Stairs Goal: LTG Patient will ambulate up and down stairs w/assist (PT) LTG: Patient will ambulate up and down # of stairs with assistance (PT)  Outcome: Not Met (add Reason) Pt continues to req min A for safety.      

## 2014-02-13 NOTE — Progress Notes (Signed)
PT refuses CPAP. RT will continue to monitor

## 2014-02-14 DIAGNOSIS — G8220 Paraplegia, unspecified: Secondary | ICD-10-CM | POA: Diagnosis not present

## 2014-02-14 LAB — GLUCOSE, CAPILLARY
GLUCOSE-CAPILLARY: 146 mg/dL — AB (ref 70–99)
GLUCOSE-CAPILLARY: 146 mg/dL — AB (ref 70–99)

## 2014-02-14 MED ORDER — INFLUENZA VAC SPLIT QUAD 0.5 ML IM SUSY: 0.5000 mL | PREFILLED_SYRINGE | INTRAMUSCULAR | Status: DC

## 2014-02-14 MED ORDER — INFLUENZA VAC SPLIT QUAD 0.5 ML IM SUSY: 0.5000 mL | PREFILLED_SYRINGE | INTRAMUSCULAR | Status: DC | PRN

## 2014-02-14 MED ORDER — INFLUENZA VAC SPLIT QUAD 0.5 ML IM SUSY
0.5000 mL | PREFILLED_SYRINGE | Freq: Once | INTRAMUSCULAR | Status: AC
  Administered 2014-02-14: 0.5 mL via INTRAMUSCULAR
  Filled 2014-02-14: qty 0.5

## 2014-02-14 NOTE — Progress Notes (Signed)
[place] PHYSICAL MEDICINE & REHABILITATION     PROGRESS NOTE    Subjective/Complaints: No complaints. Ready to go home. . No sob, cough Objective: Vital Signs: Blood pressure 125/70, pulse 85, temperature 97.8 F (36.6 C), temperature source Oral, resp. rate 17, height 6' (1.829 m), weight 111.358 kg (245 lb 8 oz), SpO2 18 %. No results found. No results for input(s): WBC, HGB, HCT, PLT in the last 72 hours. No results for input(s): NA, K, CL, GLUCOSE, BUN, CREATININE, CALCIUM in the last 72 hours.  Invalid input(s): CO CBG (last 3)   Recent Labs  02/13/14 1637 02/13/14 2122 02/14/14 0644  GLUCAP 220* 145* 146*    Wt Readings from Last 3 Encounters:  02/11/14 111.358 kg (245 lb 8 oz)  01/27/14 116.3 kg (256 lb 6.3 oz)  01/26/14 120.657 kg (266 lb)    Physical Exam:  Constitutional: He is oriented to person, place, and time. He appears well-developed and well-nourished.  HENT: dentition fair,  Head: Normocephalic and atraumatic.  Eyes: Conjunctivae are normal. Pupils are equal, round, and reactive to light.  Neck: Normal range of motion. Neck supple.  Cardiovascular: Normal rate and regular rhythm.  Respiratory: Effort normal and breath sounds normal. No respiratory distress. He has no wheezes.  GI: Soft. Bowel sounds are normal.  Neurological: He is alert and oriented to person, place, and time.   Decrease in fine motor movements R>LLE. Mild sensory deficits BLE  Improved proprioception in feet especially.  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   UE's 4/5. LE: 4 hf, 4 ke and 4/5 adf/apf. Cognitively intact  Skin: Skin is warm and dry. Back incision clean. Staples out Psychiatric: He has a normal mood and affect. His behavior is normal. Judgment and thought content normal  Assessment/Plan: 1. 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 Physiatrist is providing close team supervision and 24 hour management of active medical problems listed below. Physiatrist and rehab team continue to assess barriers to discharge/monitor patient progress toward functional and medical goals.  Dc home. Follow up arranged. i'll see him back in about a month  FIM: FIM - Bathing Bathing Steps Patient Completed: Chest, Right Arm, Left Arm, Abdomen, Front perineal area, Buttocks, Right upper leg, Left upper leg, Right lower leg (including foot), Left lower leg (including foot) Bathing: 6: More than reasonable amount of time  FIM - Upper Body Dressing/Undressing Upper body dressing/undressing steps patient completed: Thread/unthread right sleeve of pullover shirt/dresss, Thread/unthread left sleeve of pullover shirt/dress, Put head through opening of pull over shirt/dress, Pull shirt over trunk Upper body dressing/undressing: 6: More than reasonable amount of time FIM - Lower Body Dressing/Undressing Lower body dressing/undressing steps patient completed: Thread/unthread right underwear leg, Thread/unthread left underwear leg, Pull underwear up/down, Thread/unthread right pants leg, Thread/unthread left pants leg, Pull pants up/down, Fasten/unfasten pants, Don/Doff right sock, Don/Doff left sock, Don/Doff right shoe, Don/Doff left shoe, Fasten/unfasten right shoe, Fasten/unfasten left shoe Lower body dressing/undressing: 6: Assistive device (Comment) (reacher, shoe buttons, sock aid)  FIM - Toileting Toileting steps completed by patient: Adjust clothing prior to toileting, Performs perineal hygiene, Adjust clothing after toileting Toileting Assistive Devices: Grab bar or rail for support Toileting: 6: Assistive device: No helper  FIM - Radio producer Devices: Environmental consultant, Product manager Transfers: 6-To toilet/ BSC, 6-From toilet/BSC  FIM - Buyer, retail  Devices: Walker, Arm rests Bed/Chair Transfer: 6: Supine > Sit: No assist, 6: Sit > Supine: No assist, 6: Bed > Chair or W/C: No assist, 6: Chair or W/C > Bed: No assist  FIM - Locomotion: Wheelchair Distance: 150 Locomotion: Wheelchair: 0: Activity did not occur FIM - Locomotion: Ambulation Locomotion: Ambulation Assistive Devices: Administrator Ambulation/Gait Assistance: 6: Modified independent (Device/Increase time) Locomotion: Ambulation: 6: Travels 150 ft or more with assistive device/no helper  Comprehension Comprehension Mode: Auditory Comprehension: 7-Follows complex conversation/direction: With no assist  Expression Expression Mode: Verbal Expression: 7-Expresses complex ideas: With no assist  Social Interaction Social Interaction: 7-Interacts appropriately with others - No medications needed.  Problem Solving Problem Solving: 7-Solves complex problems: Recognizes & self-corrects  Memory Memory: 7-Complete Independence: No helper   Medical Problem List and Plan: 1. 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 DVT Prophylaxis/Anticoagulation: SCD's, gait/mobility 3. Pain Management: N/A--denies any discomfort.  4. Mood: LCSW to follow for evaluation and support.  5. Neuropsych: This patient is capable of making decisions on his own behalf. 6. Skin/Wound Care: Routine pressure relief measures. Monitor wound for Healing.  7. Fluids/Electrolytes/Nutrition: Will monitor I/O. good intake. Acute renal insufficiency resolving.   -k+ has been high-normal 8. Reactive leucocytosis: Likely due to surgery as well as steroids.  decadron taper   9. Acute on chronic anemia: Continue iron supplement.  10. Steroid induced hyperglycemia: SSI for elevated BS. Sugars under control 11. Onc---simulation at [facility] on 11/24. Follow up care per Dr. Lisbeth Renshaw  Hold on flu shot   LOS  (Days) 10 A FACE TO FACE EVALUATION WAS PERFORMED  Landy Dunnavant T 02/14/2014 8:40 AM

## 2014-02-14 NOTE — Progress Notes (Signed)
[id number] Daughter available at discharge to transfer patient to [facility].  All discharge instructions provided to daughter for delivery at [facility].  No further questions asked.  All personal belongings in tow.  NT to escort patient to vehicle in [facility] at time of DC.

## 2014-02-16 DIAGNOSIS — C61 Malignant neoplasm of prostate: Secondary | ICD-10-CM | POA: Diagnosis not present

## 2014-02-16 DIAGNOSIS — Z510 Encounter for antineoplastic radiation therapy: Secondary | ICD-10-CM | POA: Diagnosis not present

## 2014-02-16 DIAGNOSIS — C7931 Secondary malignant neoplasm of brain: Secondary | ICD-10-CM | POA: Diagnosis not present

## 2014-02-16 NOTE — Progress Notes (Signed)
Social Work  Discharge Note  The overall goal for the admission was met for:   Discharge location: Yes - d/c'd to [facility]F (which was d/c plan from admit)  Length of Stay: Yes - 10 days  Discharge activity level: Yes - mod i goals   Home/community participation: Yes - mod i goals  Services provided included: MD, RD, PT, OT, RN, TR, Pharmacy and SW  Financial Services: Private Insurance: [facility]  Follow-up services arranged: Home Health: PT to be arranged via facility  Comments (or additional information):  Patient/Family verbalized understanding of follow-up arrangements: Yes  Individual responsible for coordination of the follow-up plan: patient  Confirmed correct DME delivered: NA - had all needed DME    Jeniya Flannigan

## 2014-02-18 ENCOUNTER — Ambulatory Visit
Admit: 2014-02-18 | Discharge: 2014-02-18 | Disposition: A | Discharge status: Home / Self Care | Payer: Medicare Other | Attending: Radiation Oncology | Admitting: Radiation Oncology

## 2014-02-18 VITALS — BP 115/65 | HR 120 | Temp 98.9°F | Resp 20

## 2014-02-18 DIAGNOSIS — C7951 Secondary malignant neoplasm of bone: Secondary | ICD-10-CM

## 2014-02-18 DIAGNOSIS — Z510 Encounter for antineoplastic radiation therapy: Secondary | ICD-10-CM | POA: Diagnosis not present

## 2014-02-18 MED ORDER — METHYLPREDNISOLONE (PAK) 4 MG PO TABS: ORAL_TABLET | ORAL | Status: DC

## 2014-02-18 NOTE — Progress Notes (Signed)
Patient brought to exam rm 2 in wheelchair per S Boyles. Patient resting quietly in wheelchair in room 2 following SRS treatment to his spine. Accompanied by friend. Patient states he has " a slight headache". He denies other pain or problems at this time. VS within normal range.

## 2014-02-18 NOTE — Progress Notes (Signed)
Patient states he continues to have mild headache but states "it's no worse". He takes Ibuprofen and states he will take it when he returns home.  He denies other pain, problems at this time. He has been under warm blankets. VS taken. Per Dr Lisbeth Renshaw patient may be discharged at this time. Took patient to valet service per wheelchair and accompanied by his friend.

## 2014-02-18 NOTE — Op Note (Signed)
   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 Kyung Rudd, the radiation oncologist, and the physicist.  I participated closely in the radiation treatment planning afterwards. The patient underwent planning CT myelogram. Radiation oncology contoured the gross target volume and subsequently expanded this to yield the Planning Target Volume. I actively participated in the planning process.  I helped to define and review the target contours and also the contours of the spinal cord, and selected nearby organs at risk.  All the dose constraints for critical structures were reviewed and compared to AAPM Task Group 101.  The prescription dose conformity was reviewed.  I approved the plan electronically.    Accordingly, [name] was brought to the TrueBeam stereotactic radiation treatment linac and placed in the custom immobilization device.  The patient was aligned according to the IR fiducial markers with BrainLab Exactrac, then orthogonal x-rays were used in ExacTrac with the 6DOF robotic table and the shifts were made to align the patient.  Then conebeam CT was performed to verify precision.  [name] received the first of 3 stereotactic radiosurgery fractions (8Gy fraction of a total prescription of 24 Gy, to be given in 3 fractions) uneventfully.  The detailed description of the procedure is recorded in the radiation oncology procedure note.  I was present for the duration of the procedure.  DISPOSITION:  Following delivery, the patient was  transported to nursing in stable condition and monitored for possible acute effects to be discharged to home in stable condition with follow-up in one month with Dr. Lisbeth Renshaw.  Hosie Spangle, MD 02/18/2014 2:16 PM

## 2014-02-19 DIAGNOSIS — Z510 Encounter for antineoplastic radiation therapy: Secondary | ICD-10-CM | POA: Diagnosis not present

## 2014-02-23 ENCOUNTER — Encounter: Payer: Self-pay | Admitting: Radiation Oncology

## 2014-02-23 ENCOUNTER — Ambulatory Visit
Admission: RE | Admit: 2014-02-23 | Discharge: 2014-02-23 | Disposition: A | Discharge status: Home / Self Care | Payer: Medicare Other | Source: Ambulatory Visit | Attending: Radiation Oncology | Admitting: Radiation Oncology

## 2014-02-23 ENCOUNTER — Telehealth: Payer: Self-pay | Admitting: *Deleted

## 2014-02-23 VITALS — BP 130/79 | HR 91 | Temp 97.9°F | Resp 20

## 2014-02-23 DIAGNOSIS — Z510 Encounter for antineoplastic radiation therapy: Secondary | ICD-10-CM | POA: Diagnosis not present

## 2014-02-23 DIAGNOSIS — C7951 Secondary malignant neoplasm of bone: Secondary | ICD-10-CM

## 2014-02-23 NOTE — Progress Notes (Signed)
S/p SRS spine, no c/o back pain at present, no nausea, appetite good, will monitor 1/2 hour, will call [facility] to clarify prednisone pack rx written by Dr. Mike Gip past Friday 02/20/14

## 2014-02-23 NOTE — Telephone Encounter (Signed)
Called and was given voice message to Nursing station,left message that patient was to start prednisone RX pack, 4mg  tablet, take as directed  Total tablets 21, writen by Dr.MOody on 02/18/14, daughter had it filled at [facility] and took to the [facility], they wouldn't let patient take, can call me for any questions, 2:21 PM [place] [facility] [phone]

## 2014-02-23 NOTE — Progress Notes (Signed)
2nd set vitals wnl ,np c/o pain or discomfort, d/c homewith family embers at side, patient in w/c 2:11 PM

## 2014-02-25 ENCOUNTER — Ambulatory Visit
Admission: RE | Admit: 2014-02-25 | Discharge: 2014-02-25 | Disposition: A | Discharge status: Home / Self Care | Payer: Medicare Other | Source: Ambulatory Visit | Attending: Radiation Oncology | Admitting: Radiation Oncology

## 2014-02-25 ENCOUNTER — Ambulatory Visit
Admit: 2014-02-25 | Discharge: 2014-02-25 | Disposition: A | Discharge status: Home / Self Care | Payer: Medicare Other | Attending: Radiation Oncology | Admitting: Radiation Oncology

## 2014-02-25 ENCOUNTER — Telehealth: Payer: Self-pay | Admitting: *Deleted

## 2014-02-25 ENCOUNTER — Encounter: Payer: Self-pay | Admitting: Radiation Oncology

## 2014-02-25 DIAGNOSIS — C7951 Secondary malignant neoplasm of bone: Secondary | ICD-10-CM

## 2014-02-25 DIAGNOSIS — Z510 Encounter for antineoplastic radiation therapy: Secondary | ICD-10-CM | POA: Diagnosis not present

## 2014-02-25 NOTE — Telephone Encounter (Signed)
[place] after voice message from [address] [facility] stating they needed a copy of the RX for the methylprednisolone and to write on  Do not fill if daughter has had this filled at [facility]" until then they cannot give this rx, so I called and spoke with pharmaicst EDIE, she will fax over the electronic rX, but MD will probably need to write a duplicate voided rx She will fax to my attention at [place] fax  then will fax rx copy to [place] skilled fac[facility] at [phone] 9:27 AM

## 2014-02-25 NOTE — Progress Notes (Signed)
S/p SRS brain mets,  Patient tolerated well, no c/o pain or discomfort, no nausea, or head aches, in w/c, gave copy of rx to give also to [facility], offered warm blanket ,declined offer of fluids also, gave  T.v. Remote  And call bell with i reach , vitals wnl, monitor for 30 minutes then d/c home,, if fever, nausea/vomiting or any other unusual symptom not normal for patient call mD MD saw patient in the back 1:19 PM

## 2014-02-25 NOTE — Telephone Encounter (Signed)
Faxed copy of duplicate rx and copy of e-scribed rx to [facility] to [facility] [phone] 9:41 AM

## 2014-02-27 NOTE — Progress Notes (Signed)
  Radiation Oncology         [phone] ________________________________  Name: KALON ERHARDT MRN: [id number]  Date: 02/10/2014  DOB: 10-21-1940  SIMULATION AND TREATMENT PLANNING NOTE  DIAGNOSIS:  Metastatic prostate cancer  Site:  T8-10  NARRATIVE:  The patient was brought to the [place].  Identity was confirmed.  All relevant records and images related to the planned course of therapy were reviewed.   Written consent to proceed with treatment was confirmed which was freely given after reviewing the details related to the planned course of therapy had been reviewed with the patient.  Then, the patient was set-up in a stable reproducible  supine position for radiation therapy.  CT images were obtained.  Surface markings were placed.    Medically necessary complex treatment device(s) for immobilization:  Vac-lock bag, accuform device.   The CT images were loaded into the planning software.  Then the target and avoidance structures were contoured.  Treatment planning then occurred.  The radiation prescription was entered and confirmed.  A total of 2 treatment fields will be designed. Each of these customized fields/ complex treatment devices will be used on a daily basis during the radiation course. I have requested : 3D Simulation  I have requested a DVH of the following structures: target, cord, esophagus, aorta.   PLAN:  The patient will receive 24 Gy in 3 fractions.   Special treatment procedure This treatment corresponds to a course of stereotactic body radiation treatment. This treatment requires intensive work in terms of planning and delivery of the treatment and therefore constitutes as a special treatment procedure. ________________________________   Jodelle Gross, MD, PhD

## 2014-02-27 NOTE — Progress Notes (Signed)
  Radiation Oncology         [phone] ________________________________  Name: Clarence Pollard MRN: [id number]  Date: 02/25/2014  DOB: 1940/06/25  End of Treatment Note  Diagnosis:   Metastatic prostate cancer     Indication for treatment:  palliaitve       Radiation treatment dates:   02/18/14 - 02/25/14  Site/dose:    T8-10 target was treated using 2 IMRT Arcs to a prescription dose of 8 Gy.  Narrative: The patient tolerated radiation treatment relatively well.   No complaints/ signs of acute toxicity.  Plan: The patient has completed radiation treatment. The patient will return to radiation oncology clinic for routine followup in one month. I advised the patient to call or return sooner if they have any questions or concerns related to their recovery or treatment. ________________________________  Jodelle Gross, M.D., Ph.D.

## 2014-02-27 NOTE — Progress Notes (Signed)
  Radiation Oncology         [phone] ________________________________  Name: Clarence Pollard MRN: [id number]  Date: 02/18/2014  DOB: [birth year]   SPECIAL TREATMENT PROCEDURE   3D TREATMENT PLANNING AND DOSIMETRY: The patient's radiation plan was reviewed and approved by Dr. Sherwood Gambler from neurosurgery and radiation oncology prior to treatment. It showed 3-dimensional radiation distributions overlaid onto the planning CT/MRI image set. The [facility] for the target structures as well as the organs at risk were reviewed. The documentation of the 3D plan and dosimetry are filed in the radiation oncology EMR.   NARRATIVE: The patient was brought to the TrueBeam stereotactic radiation treatment machine and placed supine on the CT couch. The head frame was applied, and the patient was set up for stereotactic radiosurgery. Neurosurgery was present for the set-up and delivery   SIMULATION VERIFICATION: In the couch zero-angle position, the patient underwent Exactrac imaging using the Brainlab system with orthogonal KV images. These were carefully aligned and repeated to confirm treatment position for each of the isocenters. The Exactrac snap film verification was repeated at each couch angle. A cone beam CT scan was used to verify patient positioning.  SPECIAL TREATMENT PROCEDURE: The patient received stereotactic radiosurgery to the following targets:  T8-10 target was treated using 2 Arcs to a prescription dose of 8 Gy.   STEREOTACTIC TREATMENT MANAGEMENT: Following delivery, the patient was transported to nursing in stable condition and monitored for possible acute effects. Vital signs were recorded . The patient tolerated treatment without significant acute effects, and was discharged to home in stable condition.  PLAN: The patient will continue with the additional 2 radiation treatments.   ------------------------------------------------  Jodelle Gross, MD, PhD

## 2014-02-27 NOTE — Progress Notes (Signed)
  Radiation Oncology         [phone] ________________________________  Name: Clarence Pollard MRN: [id number]  Date: 02/25/2014  DOB: 1940/09/22   SPECIAL TREATMENT PROCEDURE   3D TREATMENT PLANNING AND DOSIMETRY: The patient's radiation plan was reviewed. It showed 3-dimensional radiation distributions overlaid onto the planning CT/MRI image set. The [facility] for the target structures as well as the organs at risk were reviewed. The documentation of the 3D plan and dosimetry are filed in the radiation oncology EMR.   NARRATIVE: The patient was brought to the TrueBeam stereotactic radiation treatment machine and placed supine on the CT couch. The head frame was applied, and the patient was set up for stereotactic radiosurgery. Neurosurgery was present for the set-up and delivery   SIMULATION VERIFICATION: In the couch zero-angle position, the patient underwent Exactrac imaging using the Brainlab system with orthogonal KV images. These were carefully aligned and repeated to confirm treatment position for each of the isocenters. The Exactrac snap film verification was repeated at each couch angle. A cone-beam CT scan was used to verify patient positioning.  SPECIAL TREATMENT PROCEDURE: The patient received stereotactic radiosurgery to the following targets:  T8-10 target was treated using 2 Arcs to a prescription dose of 8 Gy.  STEREOTACTIC TREATMENT MANAGEMENT: Following delivery, the patient was transported to nursing in stable condition and monitored for possible acute effects. Vital signs were recorded . The patient tolerated treatment without significant acute effects, and was discharged to home in stable condition.  PLAN: Follow-up in one month.   ------------------------------------------------  Jodelle Gross, MD, PhD

## 2014-02-27 NOTE — Progress Notes (Signed)
  Radiation Oncology         [phone] ________________________________  Name: Clarence Pollard MRN: [id number]  Date: 02/23/2014  DOB: [birth year]   SPECIAL TREATMENT PROCEDURE   3D TREATMENT PLANNING AND DOSIMETRY: The patient's radiation plan was reviewed. It showed 3-dimensional radiation distributions overlaid onto the planning CT/MRI image set. The [facility] for the target structures as well as the organs at risk were reviewed. The documentation of the 3D plan and dosimetry are filed in the radiation oncology EMR.   NARRATIVE: The patient was brought to the TrueBeam stereotactic radiation treatment machine and placed supine on the CT couch. The head frame was applied, and the patient was set up for stereotactic radiosurgery. Neurosurgery was present for the set-up and delivery   SIMULATION VERIFICATION: In the couch zero-angle position, the patient underwent Exactrac imaging using the Brainlab system with orthogonal KV images. These were carefully aligned and repeated to confirm treatment position for each of the isocenters. The Exactrac snap film verification was repeated at each couch angle. A cone beam CT scan was used to verify patient positionaing.  SPECIAL TREATMENT PROCEDURE: The patient received stereotactic radiosurgery to the following targets:  T8-10 target was treated using 2 Arcs to a prescription dose of 8 Gy.  STEREOTACTIC TREATMENT MANAGEMENT: Following delivery, the patient was transported to nursing in stable condition and monitored for possible acute effects. Vital signs were recorded . The patient tolerated treatment without significant acute effects, and was discharged to home in stable condition.  PLAN: 2/3 fractions completed. He will return for fraction 3.   ------------------------------------------------  Jodelle Gross, MD, PhD

## 2014-03-17 ENCOUNTER — Encounter (HOSPITAL_COMMUNITY)
Admission: RE | Admit: 2014-03-17 | Discharge: 2014-03-17 | Disposition: A | Discharge status: Home / Self Care | Payer: Medicare Other | Source: Ambulatory Visit | Attending: Oncology | Admitting: Oncology

## 2014-03-17 DIAGNOSIS — C61 Malignant neoplasm of prostate: Secondary | ICD-10-CM | POA: Diagnosis not present

## 2014-03-17 MED ORDER — TECHNETIUM TC 99M MEDRONATE IV KIT
27.5000 | PACK | Freq: Once | INTRAVENOUS | Status: AC | PRN
  Administered 2014-03-17: 27.5 via INTRAVENOUS

## 2014-03-18 ENCOUNTER — Ambulatory Visit (HOSPITAL_BASED_OUTPATIENT_CLINIC_OR_DEPARTMENT_OTHER): Payer: Medicare Other | Admitting: Oncology

## 2014-03-18 ENCOUNTER — Encounter: Payer: Self-pay | Admitting: Oncology

## 2014-03-18 ENCOUNTER — Telehealth: Payer: Self-pay | Admitting: Oncology

## 2014-03-18 ENCOUNTER — Encounter: Payer: Self-pay | Admitting: *Deleted

## 2014-03-18 VITALS — BP 128/76 | HR 94 | Temp 97.7°F | Resp 18 | Ht 72.0 in | Wt 245.8 lb

## 2014-03-18 DIAGNOSIS — E291 Testicular hypofunction: Secondary | ICD-10-CM

## 2014-03-18 DIAGNOSIS — C61 Malignant neoplasm of prostate: Secondary | ICD-10-CM

## 2014-03-18 DIAGNOSIS — C7951 Secondary malignant neoplasm of bone: Secondary | ICD-10-CM

## 2014-03-18 DIAGNOSIS — IMO0002 Reserved for concepts with insufficient information to code with codable children: Secondary | ICD-10-CM

## 2014-03-18 MED ORDER — ENZALUTAMIDE 40 MG PO CAPS: 160.0000 mg | ORAL_CAPSULE | Freq: Every day | ORAL | Status: DC

## 2014-03-18 NOTE — Progress Notes (Signed)
[employer] approved xtandi from 03/18/14-03/19/15

## 2014-03-18 NOTE — Progress Notes (Signed)
Faxed xtandi prescription to [facility] Faxed xtandi prior auth form to [employer]

## 2014-03-18 NOTE — Progress Notes (Signed)
Script for [employer]	[place] given to [facility] in managed care for financial assistance. Patient and daughter given written information on Xtandi.

## 2014-03-18 NOTE — Progress Notes (Signed)
Hematology and Oncology Follow Up Visit  Clarence Pollard [id number] 03/21/1940 73 y.o. 03/18/2014 10:55 AM [name], MDElkins, Curt Jews, *   Principle Diagnosis: 72 year old gentleman with prostate cancer diagnosed in July of 2014. He presented with a Gleason score 5+4 = 9 and a PSA of 34.6. 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   Prior Therapy: He is status post radiation therapy to the thoracic spine between T7 and T8-11. Therapy concluded in August of 2014. He is status post T8-T10 thoracic laminectomy and epidural tumor decompression done on 01/29/2014. He is status post radiation therapy completed on 02/25/2014 to the thoracic spine between T8 and T10.  Current therapy: Casodex 50 mg daily started in March of 2015. Lupron given every 3 months under the care of Dr. Janice Norrie. [name] given monthly under the care of Dr. Janice Norrie  Interim History:  Mr. Clarence Pollard presents today for a followup visit. Since the last visit, he developed epidural cord compression and required a laminectomy and decompression in November 2015. He subsequently required physical therapy in the rehabilitation stay and finished radiation therapy to the thoracic spine on 02/25/2014. Since that time, he did report some decrease in his appetite and occasional diarrhea.. Has not reported any abdominal discomfort. Has not reported any hematochezia or melena. His mobility is improving and currently uses a walker for transfer. He has not reported any skeletal pain anywhere else at this point. Has not reported any genitourinary complaints.  He continued to have a reasonable performance status. Rest of his review of system is unremarkable.  Medications: I have reviewed the patient's current medications.  Current Outpatient Prescriptions  Medication Sig Dispense Refill  . Amino Acids-Protein Hydrolys (FEEDING SUPPLEMENT, PRO-STAT SUGAR FREE 64,) LIQD Take 30 mLs by mouth 3 (three) times  daily with meals. 900 mL 0  . baclofen (LIORESAL) 10 MG tablet Take 1 tablet (10 mg total) by mouth 2 (two) times daily. 60 each 1  . benztropine (COGENTIN) 0.5 MG tablet Take 0.5 mg by mouth every 12 (twelve) hours.    . bicalutamide (CASODEX) 50 MG tablet Take 50 mg by mouth daily.    . brimonidine (ALPHAGAN P) 0.1 % SOLN Apply 1 drop to eye at bedtime. Both eyes    . Calcium Carbonate-Vitamin D (CALCIUM-VITAMIN D) 500-200 MG-UNIT per tablet Take 1 tablet by mouth 2 (two) times daily with a meal.    . dexamethasone (DECADRON) 4 MG tablet Take 1 tablet (4 mg total) by mouth every 12 (twelve) hours. 60 tablet 0  . ergocalciferol (VITAMIN D2) 50000 UNITS capsule Take 50,000 Units by mouth once a week. Every Saturday.    . ferrous sulfate 325 (65 FE) MG tablet Take 1 tablet (325 mg total) by mouth 2 (two) times daily with a meal. 60 tablet 1  . pantoprazole (PROTONIX) 40 MG tablet Take 1 tablet (40 mg total) by mouth daily. Take while on decadron 30 tablet 0  . travoprost, benzalkonium, (TRAVATAN) 0.004 % ophthalmic solution Place 1 drop into both eyes at bedtime.     . enzalutamide (XTANDI) 40 MG capsule Take 4 capsules (160 mg total) by mouth daily. Start on 03/30/2014 120 capsule 0   No current facility-administered medications for this visit.     Allergies: No Known Allergies  Past Medical History, Surgical history, Social history, and Family History were reviewed and updated.   Physical Exam: Blood pressure 128/76, pulse 94, temperature 97.7 F (36.5 C), temperature source Oral, resp. rate  18, height 6' (1.829 m), weight 245 lb 12.8 oz (111.494 kg), SpO2 97 %. ECOG: 1 General appearance: alert and cooperative Head: Normocephalic, without obvious abnormality Neck: no adenopathy Lymph nodes: Cervical, supraclavicular, and axillary nodes normal. Heart:regular rate and rhythm, S1, S2 normal, no murmur, click, rub or gallop Lung:chest clear, no wheezing, rales, normal symmetric air  entry Abdomin: soft, non-tender, without masses or organomegaly EXT:no erythema, induration, or nodules Neurological: No deficits noted.  Lab Results: Lab Results  Component Value Date   WBC 14.2* 02/05/2014   HGB 10.8* 02/05/2014   HCT 35.3* 02/05/2014   MCV 82.7 02/05/2014   PLT 216 02/05/2014     Chemistry      Component Value Date/Time   NA 136* 02/10/2014 0516   NA 144 07/22/2013 1357   K 5.0 02/10/2014 0516   K 3.8 07/22/2013 1357   CL 95* 02/10/2014 0516   CO2 27 02/10/2014 0516   CO2 25 07/22/2013 1357   BUN 37* 02/10/2014 0516   BUN 14.7 07/22/2013 1357   CREATININE 0.83 02/10/2014 0516   CREATININE 1.1 07/22/2013 1357      Component Value Date/Time   CALCIUM 8.4 02/10/2014 0516   CALCIUM 9.2 07/22/2013 1357   ALKPHOS 144* 02/05/2014 0501   ALKPHOS 111 07/22/2013 1357   AST 26 02/05/2014 0501   AST 14 07/22/2013 1357   ALT 12 02/05/2014 0501   ALT 9 07/22/2013 1357   BILITOT 0.5 02/05/2014 0501   BILITOT 0.42 07/22/2013 1357       EXAM: NUCLEAR MEDICINE WHOLE BODY BONE SCAN  TECHNIQUE: Whole body anterior and posterior images were obtained approximately 3 hours after intravenous injection of radiopharmaceutical.  RADIOPHARMACEUTICALS: 27.5 mCi Technetium-99 MDP  COMPARISON: Whole-body bone scan 10/02/2012, CT thoracic and lumbar spine 02/10/2014  FINDINGS: The previous intense activity in the lower thoracic spine has diminished post thoracic decompression. Residual increased uptake is seen in the left aspect of the lower and right aspect of the upper surgical bed. There is moderate multi focal posterior rib uptake corresponding to sclerotic lesions on prior CT. The rib activity appears increased compared to prior bone scan. Activity within the T12 and L2 corresponds to sclerotic lesions on CT. These are more pronounced on prior bone scan. Small focus of increased uptake in the cervical spine appear similar to prior. There is moderate  uptake involving the left iliac crest, left ischium, left mid proximal femoral diaphysis are similar or diminished compared to prior. There are new focus in the distal left femoral diaphysis, left and right posterior pelvis. New focus in the distal right femur. Increased uptake medial left clavicle, progressed from prior bone scan. Left scapular tip activity is diminished from prior. Minimal left facial uptake, significance uncertain. Both kidneys and bladder are visualized.  IMPRESSION: 1. Multifocal osseous metastatic disease. 2. The intense activity in the mid lower thoracic spine has diminished post decompression. 3. New/progressive lesions are seen compared to prior bone scan in the pelvis, lumbar spine, posterior ribs, left clavicle, and distal left and right femoral diaphyses. However, there is diminished intensity involving the lesions in the left scapula and left pelvis which may reflect improvement. Additionally some lesions are stable compared to prior. 4. Ill-defined activity in the left face is nonspecific.     Impression and Plan:  73 year old gentleman with the following issues:  1. Advanced prostate cancer with metastatic disease to the bone. He is currently on Lupron and Casodex was added in March of 2015. He developed  epidural cord condition and disease progression based on the bone scan done on 03/17/2014. This was discussed extensively with the patient and his daughter today. Options of treatments were discussed including second line hormonal manipulation with xtandi or Zytiga versus systemic chemotherapy. The risks and benefits of all these approaches were discussed. Written information about xtandi was given to the patient and he is willing to proceed. Complications include fatigue, tiredness, GI complications and rarely seizures were discussed. I'll like to give him couple more weeks for recovery before we start I anticipate start date around 03/30/2014.  2.  Androgen depravation: I recommended he continue on Lupron every 3 months with Dr. Janice Norrie.   3. Bone health: I encouraged him to continue with his calcium supplements as well as monthly Xgeva.   4. Follow-up: Will be in one month to assess any complications from this medication.  EKBTCY,ELYHT, MD 12/30/201510:55 AM

## 2014-03-18 NOTE — Telephone Encounter (Signed)
gv adn printed appt sched and avs for pt for Feb 2016 °

## 2014-03-25 ENCOUNTER — Encounter: Payer: Medicare Other | Admitting: Physical Medicine & Rehabilitation

## 2014-04-06 ENCOUNTER — Ambulatory Visit
Admission: RE | Admit: 2014-04-06 | Discharge: 2014-04-06 | Disposition: A | Discharge status: Home / Self Care | Payer: Medicare Other | Source: Ambulatory Visit | Attending: Radiation Oncology | Admitting: Radiation Oncology

## 2014-04-06 DIAGNOSIS — C7951 Secondary malignant neoplasm of bone: Secondary | ICD-10-CM

## 2014-04-06 NOTE — Progress Notes (Signed)
Follow up s/p rad txs  :T8-T10 02/18/14- 02/25/14,  Appetite fair, energy level coming back, started Xtandi  , no pain in back, but did get some esophagitis after completing radiation, is getting better, ,  8:58 AM

## 2014-04-07 ENCOUNTER — Encounter: Payer: Self-pay | Admitting: Radiation Oncology

## 2014-04-07 NOTE — Progress Notes (Signed)
Radiation Oncology         [phone] ________________________________  Name: Clarence Pollard MRN: [id number]  Date: 04/06/2014  DOB: Jul 25, 1940  Follow-Up Visit Note  CC: [name], MD  Hosie Spangle, MD  Diagnosis:   Metastatic prostate cancer with bony metastasis  Interval Since Last Radiation:  One month   Narrative:  The patient returns today for routine follow-up.  The patient states that he is doing well. He has continued with rehabilitation which has gone well. He was seen by neurosurgery recently with a good report. He continues with systemic treatment through medical oncology and this also has gone well. The patient really reports no back pain currently. No other distant bony pain. The patient had a recent bone scan which was reviewed with the patient's. Imaging was shown and we discussed this. The patient does have multifocal osseous metastatic disease but really is not having any pain at this time.                              ALLERGIES:  has No Known Allergies.  Meds: Current Outpatient Prescriptions  Medication Sig Dispense Refill  . Amino Acids-Protein Hydrolys (FEEDING SUPPLEMENT, PRO-STAT SUGAR FREE 64,) LIQD Take 30 mLs by mouth 3 (three) times daily with meals. 900 mL 0  . brimonidine (ALPHAGAN P) 0.1 % SOLN Apply 1 drop to eye at bedtime. Both eyes    . Calcium Carbonate-Vitamin D (CALCIUM-VITAMIN D) 500-200 MG-UNIT per tablet Take 1 tablet by mouth 2 (two) times daily with a meal.    . enzalutamide (XTANDI) 40 MG capsule Take 4 capsules (160 mg total) by mouth daily. Start on 03/30/2014 120 capsule 0  . ergocalciferol (VITAMIN D2) 50000 UNITS capsule Take 50,000 Units by mouth once a week. Every Saturday.    . ferrous sulfate 325 (65 FE) MG tablet Take 1 tablet (325 mg total) by mouth 2 (two) times daily with a meal. 60 tablet 1  . ibuprofen (ADVIL,MOTRIN) 600 MG tablet Take 600 mg by mouth as needed.    . travoprost, benzalkonium, (TRAVATAN) 0.004  % ophthalmic solution Place 1 drop into both eyes at bedtime.     . baclofen (LIORESAL) 10 MG tablet Take 1 tablet (10 mg total) by mouth 2 (two) times daily. 60 each 1  . benztropine (COGENTIN) 0.5 MG tablet Take 0.5 mg by mouth every 12 (twelve) hours.    Marland Kitchen dexamethasone (DECADRON) 4 MG tablet Take 1 tablet (4 mg total) by mouth every 12 (twelve) hours. (Patient not taking: Reported on 04/06/2014) 60 tablet 0  . pantoprazole (PROTONIX) 40 MG tablet Take 1 tablet (40 mg total) by mouth daily. Take while on decadron (Patient not taking: Reported on 04/06/2014) 30 tablet 0   No current facility-administered medications for this encounter.    Physical Findings: The patient is in no acute distress. Patient is alert and oriented.  vitals were not taken for this visit..     Lab Findings: Lab Results  Component Value Date   WBC 14.2* 02/05/2014   HGB 10.8* 02/05/2014   HCT 35.3* 02/05/2014   MCV 82.7 02/05/2014   PLT 216 02/05/2014     Radiographic Findings: Nm Bone Scan Whole Body  03/17/2014   CLINICAL DATA:  Prostate cancer with bone metastasis. Recent thoracic decompression.  EXAM: NUCLEAR MEDICINE WHOLE BODY BONE SCAN  TECHNIQUE: Whole body anterior and posterior images were obtained approximately 3 hours after intravenous  injection of radiopharmaceutical.  RADIOPHARMACEUTICALS:  27.5 mCi Technetium-99 MDP  COMPARISON:  Whole-body bone scan 10/02/2012, CT thoracic and lumbar spine 02/10/2014  FINDINGS: The previous intense activity in the lower thoracic spine has diminished post thoracic decompression. Residual increased uptake is seen in the left aspect of the lower and right aspect of the upper surgical bed. There is moderate multi focal posterior rib uptake corresponding to sclerotic lesions on prior CT. The rib activity appears increased compared to prior bone scan. Activity within the T12 and L2 corresponds to sclerotic lesions on CT. These are more pronounced on prior bone scan. Small  focus of increased uptake in the cervical spine appear similar to prior. There is moderate uptake involving the left iliac crest, left ischium, left mid proximal femoral diaphysis are similar or diminished compared to prior. There are new focus in the distal left femoral diaphysis, left and right posterior pelvis. New focus in the distal right femur. Increased uptake medial left clavicle, progressed from prior bone scan. Left scapular tip activity is diminished from prior. Minimal left facial uptake, significance uncertain. Both kidneys and bladder are visualized.  IMPRESSION: 1. Multifocal osseous metastatic disease. 2. The intense activity in the mid lower thoracic spine has diminished post decompression. 3. New/progressive lesions are seen compared to prior bone scan in the pelvis, lumbar spine, posterior ribs, left clavicle, and distal left and right femoral diaphyses. However, there is diminished intensity involving the lesions in the left scapula and left pelvis which may reflect improvement. Additionally some lesions are stable compared to prior. 4. Ill-defined activity in the left face is nonspecific.   Electronically Signed   By: Jeb Levering M.D.   On: 03/17/2014 14:07    Impression:    The patient clinically is doing well. He will continue with systemic treatment. I discussed the possibility of additional palliative radiation treatment if necessary. He also may be a good candidate for Xofig at some point as well.o   Plan:  Follow-up in 4 months.   Jodelle Gross, M.D., Ph.D.

## 2014-04-15 ENCOUNTER — Other Ambulatory Visit: Payer: Self-pay | Admitting: *Deleted

## 2014-04-15 MED ORDER — HYDROCODONE-ACETAMINOPHEN 5-325 MG PO TABS: 1.0000 | ORAL_TABLET | Freq: Four times a day (QID) | ORAL | Status: DC | PRN

## 2014-04-15 NOTE — Telephone Encounter (Signed)
Patient c/o increased pain in left hip,knee and leg. Uses walker to ambulate. Ibuprofen 600mg  is not strong enough. Signed script for norco left at front for patient p/u. Patient notified. Will p/u script tomorrow.

## 2014-04-20 ENCOUNTER — Encounter: Payer: Self-pay | Admitting: Oncology

## 2014-04-20 NOTE — Progress Notes (Signed)
Per [facility] Xtandi approved 04/01/14-04/01/15  12,000.00. I will send to medical records and billing.

## 2014-04-24 ENCOUNTER — Ambulatory Visit (HOSPITAL_BASED_OUTPATIENT_CLINIC_OR_DEPARTMENT_OTHER): Payer: Medicare Other | Admitting: Oncology

## 2014-04-24 ENCOUNTER — Other Ambulatory Visit (HOSPITAL_BASED_OUTPATIENT_CLINIC_OR_DEPARTMENT_OTHER): Payer: Medicare Other

## 2014-04-24 ENCOUNTER — Telehealth: Payer: Self-pay | Admitting: Oncology

## 2014-04-24 VITALS — BP 130/65 | HR 93 | Temp 98.1°F | Resp 18 | Ht 72.0 in | Wt 245.8 lb

## 2014-04-24 DIAGNOSIS — E291 Testicular hypofunction: Secondary | ICD-10-CM

## 2014-04-24 DIAGNOSIS — C61 Malignant neoplasm of prostate: Secondary | ICD-10-CM

## 2014-04-24 DIAGNOSIS — IMO0002 Reserved for concepts with insufficient information to code with codable children: Secondary | ICD-10-CM

## 2014-04-24 DIAGNOSIS — C7951 Secondary malignant neoplasm of bone: Secondary | ICD-10-CM

## 2014-04-24 LAB — CBC WITH DIFFERENTIAL/PLATELET
BASO%: 0.3 % (ref 0.0–2.0)
Basophils Absolute: 0 10*3/uL (ref 0.0–0.1)
EOS%: 2.2 % (ref 0.0–7.0)
Eosinophils Absolute: 0.2 10*3/uL (ref 0.0–0.5)
HCT: 31.9 % — ABNORMAL LOW (ref 38.4–49.9)
HGB: 10 g/dL — ABNORMAL LOW (ref 13.0–17.1)
LYMPH#: 2.3 10*3/uL (ref 0.9–3.3)
LYMPH%: 21.4 % (ref 14.0–49.0)
MCH: 25.1 pg — AB (ref 27.2–33.4)
MCHC: 31.3 g/dL — ABNORMAL LOW (ref 32.0–36.0)
MCV: 79.9 fL (ref 79.3–98.0)
MONO#: 0.9 10*3/uL (ref 0.1–0.9)
MONO%: 8.4 % (ref 0.0–14.0)
NEUT#: 7.3 10*3/uL — ABNORMAL HIGH (ref 1.5–6.5)
NEUT%: 67.7 % (ref 39.0–75.0)
NRBC: 0 % (ref 0–0)
PLATELETS: 341 10*3/uL (ref 140–400)
RBC: 3.99 10*6/uL — AB (ref 4.20–5.82)
RDW: 17.3 % — AB (ref 11.0–14.6)
WBC: 10.8 10*3/uL — ABNORMAL HIGH (ref 4.0–10.3)

## 2014-04-24 LAB — COMPREHENSIVE METABOLIC PANEL (CC13)
ALBUMIN: 2.7 g/dL — AB (ref 3.5–5.0)
ALT: 7 U/L (ref 0–55)
AST: 12 U/L (ref 5–34)
Alkaline Phosphatase: 279 U/L — ABNORMAL HIGH (ref 40–150)
Anion Gap: 11 mEq/L (ref 3–11)
BUN: 15.4 mg/dL (ref 7.0–26.0)
CALCIUM: 8.7 mg/dL (ref 8.4–10.4)
CO2: 21 meq/L — AB (ref 22–29)
Chloride: 106 mEq/L (ref 98–109)
Creatinine: 0.7 mg/dL (ref 0.7–1.3)
Glucose: 110 mg/dl (ref 70–140)
POTASSIUM: 4.3 meq/L (ref 3.5–5.1)
Sodium: 138 mEq/L (ref 136–145)
Total Bilirubin: 0.32 mg/dL (ref 0.20–1.20)
Total Protein: 7.4 g/dL (ref 6.4–8.3)

## 2014-04-24 NOTE — Progress Notes (Signed)
Hematology and Oncology Follow Up Visit  Clarence Pollard [id number] Aug 27, 1940 74 y.o. 04/24/2014 4:15 PM Clarence Pollard, MDElkins, Clarence Pollard, *   Principle Diagnosis: 74 year old gentleman with prostate cancer diagnosed in July of 2014. He presented with a Gleason score 5+4 = 9 and a PSA of 34.6. 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   Prior Therapy: He is status post radiation therapy to the thoracic spine between T7 and T8-11. Therapy concluded in August of 2014. He is status post T8-T10 thoracic laminectomy and epidural tumor decompression done on 01/29/2014. He is status post radiation therapy completed on 02/25/2014 to the thoracic spine between T8 and T10.  Current therapy: Xtandi 160 mg daily started in January 2016. Lupron given every 3 months under the care of Dr. Janice Norrie. [name] given monthly under the care of Dr. Janice Norrie  Interim History:  Clarence Pollard presents today for a followup visit. Since the last visit, he started xtandi and tolerated it without any major complications. He initially developed some mild nausea and fatigue but have resolved at this time. Does not report any other side effects from this medication. His mobility has improved and currently uses a walker for long distances. He did have one fall after he slipped in the shower did not sustain any injuries. He does not report any new complications related to his cancer.  Has not reported any abdominal discomfort. Has not reported any hematochezia or melena. He has not reported any skeletal pain anywhere else at this point and occasionally uses Advil for pain. Has not reported any genitourinary complaints.  He continued to have a reasonable performance status. His appetite have resumed properly and his weight have been stable. Rest of his review of system is unremarkable.  Medications: I have reviewed the patient's current medications.  Current Outpatient Prescriptions  Medication  Sig Dispense Refill  . Amino Acids-Protein Hydrolys (FEEDING SUPPLEMENT, PRO-STAT SUGAR FREE 64,) LIQD Take 30 mLs by mouth 3 (three) times daily with meals. 900 mL 0  . baclofen (LIORESAL) 10 MG tablet Take 1 tablet (10 mg total) by mouth 2 (two) times daily. 60 each 1  . benztropine (COGENTIN) 0.5 MG tablet Take 0.5 mg by mouth every 12 (twelve) hours.    . brimonidine (ALPHAGAN P) 0.1 % SOLN Apply 1 drop to eye at bedtime. Both eyes    . Calcium Carbonate-Vitamin D (CALCIUM-VITAMIN D) 500-200 MG-UNIT per tablet Take 1 tablet by mouth 2 (two) times daily with a meal.    . dexamethasone (DECADRON) 4 MG tablet Take 1 tablet (4 mg total) by mouth every 12 (twelve) hours. (Patient not taking: Reported on 04/06/2014) 60 tablet 0  . enzalutamide (XTANDI) 40 MG capsule Take 4 capsules (160 mg total) by mouth daily. Start on 03/30/2014 120 capsule 0  . ergocalciferol (VITAMIN D2) 50000 UNITS capsule Take 50,000 Units by mouth once a week. Every Saturday.    . ferrous sulfate 325 (65 FE) MG tablet Take 1 tablet (325 mg total) by mouth 2 (two) times daily with a meal. 60 tablet 1  . HYDROcodone-acetaminophen (NORCO/VICODIN) 5-325 MG per tablet Take 1 tablet by mouth every 6 (six) hours as needed. 30 tablet 0  . ibuprofen (ADVIL,MOTRIN) 600 MG tablet Take 600 mg by mouth as needed.    . pantoprazole (PROTONIX) 40 MG tablet Take 1 tablet (40 mg total) by mouth daily. Take while on decadron (Patient not taking: Reported on 04/06/2014) 30 tablet 0  . travoprost, benzalkonium, (  TRAVATAN) 0.004 % ophthalmic solution Place 1 drop into both eyes at bedtime.      No current facility-administered medications for this visit.     Allergies: No Known Allergies  Past Medical History, Surgical history, Social history, and Family History were reviewed and updated.   Physical Exam: Blood pressure 130/65, pulse 93, temperature 98.1 F (36.7 C), temperature source Oral, resp. rate 18, height 6' (1.829 m), weight 245 lb  12.8 oz (111.494 kg), SpO2 98 %. ECOG: 1 General appearance: alert and cooperative not in any distress. Head: Normocephalic, without obvious abnormality Neck: no adenopathy Lymph nodes: Cervical, supraclavicular, and axillary nodes normal. Heart:regular rate and rhythm, S1, S2 normal, no murmur, click, rub or gallop Lung:chest clear, no wheezing, rales, normal symmetric air entry Abdomin: soft, non-tender, without masses or organomegaly EXT:no erythema, induration, or nodules Neurological: No deficits noted.  Lab Results: Lab Results  Component Value Date   WBC 10.8* 04/24/2014   HGB 10.0* 04/24/2014   HCT 31.9* 04/24/2014   MCV 79.9 04/24/2014   PLT 341 04/24/2014     Chemistry      Component Value Date/Time   NA 138 04/24/2014 1512   NA 136* 02/10/2014 0516   K 4.3 04/24/2014 1512   K 5.0 02/10/2014 0516   CL 95* 02/10/2014 0516   CO2 21* 04/24/2014 1512   CO2 27 02/10/2014 0516   BUN 15.4 04/24/2014 1512   BUN 37* 02/10/2014 0516   CREATININE 0.7 04/24/2014 1512   CREATININE 0.83 02/10/2014 0516      Component Value Date/Time   CALCIUM 8.7 04/24/2014 1512   CALCIUM 8.4 02/10/2014 0516   ALKPHOS 279* 04/24/2014 1512   ALKPHOS 144* 02/05/2014 0501   AST 12 04/24/2014 1512   AST 26 02/05/2014 0501   ALT 7 04/24/2014 1512   ALT 12 02/05/2014 0501   BILITOT 0.32 04/24/2014 1512   BILITOT 0.5 02/05/2014 0501         Impression and Plan:  74 year old gentleman with the following issues:  1. Advanced prostate cancer with metastatic disease to the bone. He is currently on Lupron and Casodex was added in March of 2015. He developed epidural cord condition and disease progression based on the bone scan done on 03/17/2014. He started on xtandi January 2016 and have tolerated it well for the most part. The plan is to continue with the current dose and schedule and follow his PSA regularly.  2. Androgen depravation: I recommended he continue on Lupron every 3 months  with Dr. Janice Norrie.   3. Bone health: I encouraged him to continue with his calcium supplements as well as monthly Xgeva.   4. Follow-up: Will be in one month to assess any complications from this medication and to recheck his PSA.  Zola Button, MD 2/5/20164:15 PM

## 2014-04-24 NOTE — Telephone Encounter (Signed)
gv and printed appt sched anda vs for pt for March.... °

## 2014-04-25 LAB — PSA: PSA: 37.51 ng/mL — ABNORMAL HIGH (ref <=4.00)

## 2014-05-01 ENCOUNTER — Other Ambulatory Visit: Payer: Self-pay | Admitting: Oncology

## 2014-05-01 ENCOUNTER — Telehealth: Payer: Self-pay | Admitting: *Deleted

## 2014-05-01 ENCOUNTER — Other Ambulatory Visit: Payer: Self-pay | Admitting: *Deleted

## 2014-05-01 MED ORDER — ENZALUTAMIDE 40 MG PO CAPS: 160.0000 mg | ORAL_CAPSULE | Freq: Every day | ORAL | Status: DC

## 2014-05-01 NOTE — Telephone Encounter (Signed)
THIS MESSAGE WAS SENT TO DR.Weiner.

## 2014-05-01 NOTE — Telephone Encounter (Signed)
UNABLE TO LEAVE VOICE MAIL FOR PT.'S DAUGHTER. HER MAIL BOX WAS FULL.

## 2014-05-01 NOTE — Telephone Encounter (Signed)
Spoke with patient, gave results of PSA.

## 2014-05-29 ENCOUNTER — Ambulatory Visit (HOSPITAL_BASED_OUTPATIENT_CLINIC_OR_DEPARTMENT_OTHER): Payer: Medicare Other | Admitting: Oncology

## 2014-05-29 ENCOUNTER — Telehealth: Payer: Self-pay | Admitting: Oncology

## 2014-05-29 ENCOUNTER — Other Ambulatory Visit: Payer: Self-pay | Admitting: *Deleted

## 2014-05-29 ENCOUNTER — Other Ambulatory Visit (HOSPITAL_BASED_OUTPATIENT_CLINIC_OR_DEPARTMENT_OTHER): Payer: Medicare Other

## 2014-05-29 VITALS — BP 130/69 | HR 86 | Temp 97.5°F | Resp 20 | Ht 72.0 in | Wt 249.2 lb

## 2014-05-29 DIAGNOSIS — C61 Malignant neoplasm of prostate: Secondary | ICD-10-CM

## 2014-05-29 DIAGNOSIS — C7951 Secondary malignant neoplasm of bone: Secondary | ICD-10-CM

## 2014-05-29 DIAGNOSIS — E291 Testicular hypofunction: Secondary | ICD-10-CM

## 2014-05-29 DIAGNOSIS — IMO0002 Reserved for concepts with insufficient information to code with codable children: Secondary | ICD-10-CM

## 2014-05-29 LAB — CBC WITH DIFFERENTIAL/PLATELET
BASO%: 0.3 % (ref 0.0–2.0)
BASOS ABS: 0 10*3/uL (ref 0.0–0.1)
EOS ABS: 0.2 10*3/uL (ref 0.0–0.5)
EOS%: 2.5 % (ref 0.0–7.0)
HEMATOCRIT: 29.5 % — AB (ref 38.4–49.9)
HGB: 9.4 g/dL — ABNORMAL LOW (ref 13.0–17.1)
LYMPH#: 2.3 10*3/uL (ref 0.9–3.3)
LYMPH%: 24.6 % (ref 14.0–49.0)
MCH: 25.8 pg — ABNORMAL LOW (ref 27.2–33.4)
MCHC: 31.9 g/dL — ABNORMAL LOW (ref 32.0–36.0)
MCV: 80.8 fL (ref 79.3–98.0)
MONO#: 0.9 10*3/uL (ref 0.1–0.9)
MONO%: 10.2 % (ref 0.0–14.0)
NEUT%: 62.4 % (ref 39.0–75.0)
NEUTROS ABS: 5.8 10*3/uL (ref 1.5–6.5)
Platelets: 339 10*3/uL (ref 140–400)
RBC: 3.65 10*6/uL — ABNORMAL LOW (ref 4.20–5.82)
RDW: 19.6 % — AB (ref 11.0–14.6)
WBC: 9.2 10*3/uL (ref 4.0–10.3)

## 2014-05-29 LAB — COMPREHENSIVE METABOLIC PANEL (CC13)
ALBUMIN: 2.8 g/dL — AB (ref 3.5–5.0)
ALK PHOS: 296 U/L — AB (ref 40–150)
ALT: 7 U/L (ref 0–55)
AST: 9 U/L (ref 5–34)
Anion Gap: 10 mEq/L (ref 3–11)
BUN: 10.6 mg/dL (ref 7.0–26.0)
CO2: 22 meq/L (ref 22–29)
Calcium: 8 mg/dL — ABNORMAL LOW (ref 8.4–10.4)
Chloride: 105 mEq/L (ref 98–109)
Creatinine: 0.7 mg/dL (ref 0.7–1.3)
EGFR: >90 mL/min/{1.73_m2} (ref >90)
Glucose: 108 mg/dl (ref 70–140)
Potassium: 4 mEq/L (ref 3.5–5.1)
SODIUM: 137 meq/L (ref 136–145)
TOTAL PROTEIN: 7.3 g/dL (ref 6.4–8.3)
Total Bilirubin: 0.47 mg/dL (ref 0.20–1.20)

## 2014-05-29 MED ORDER — ENZALUTAMIDE 40 MG PO CAPS: 160.0000 mg | ORAL_CAPSULE | Freq: Every day | ORAL | Status: DC

## 2014-05-29 NOTE — Telephone Encounter (Signed)
gv adn printed appt sched and avs for pt for April  °

## 2014-05-29 NOTE — Progress Notes (Signed)
Hematology and Oncology Follow Up Visit  Clarence Pollard [id number] [birth year] 74 y.o. 05/29/2014 3:38 PM Clarence Pollard, MDElkins, Clarence Pollard, *   Principle Diagnosis: 74 year old gentleman with prostate cancer diagnosed in July of 2014. He presented with a Gleason score 5+4 = 9 and a PSA of 34.6. 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   Prior Therapy: He is status post radiation therapy to the thoracic spine between T7 and T8-11. Therapy concluded in August of 2014. He is status post T8-T10 thoracic laminectomy and epidural tumor decompression done on 01/29/2014. He is status post radiation therapy completed on 02/25/2014 to the thoracic spine between T8 and T10.  Current therapy: Xtandi 160 mg daily started in January 2016. Lupron given every 3 months under the care of Clarence Pollard. Clarence Pollard given monthly under the care of Clarence Pollard  Interim History:  Clarence Pollard presents today for a followup visit. Since the last visit, he Xtandi and tolerated it without any major complications. Have a few days of diarrhea, nausea and vomiting but attributed to a viral illness. He does not report any other side effects from this medication. His mobility has improved and currently uses a walker for long distances. He continues to reside in a skilled nursing facility.  Has not reported any abdominal discomfort. Has not reported any hematochezia or melena. He has not reported any skeletal pain anywhere else at this point and occasionally uses Advil for pain. Has not reported any genitourinary complaints.  He continued to have a reasonable performance status. His appetite have resumed properly and his weight have been stable. Rest of his review of system is unremarkable.  Medications: I have reviewed the patient's current medications.  Current Outpatient Prescriptions  Medication Sig Dispense Refill  . Amino Acids-Protein Hydrolys (FEEDING SUPPLEMENT, PRO-STAT SUGAR FREE  64,) LIQD Take 30 mLs by mouth 3 (three) times daily with meals. 900 mL 0  . baclofen (LIORESAL) 10 MG tablet Take 1 tablet (10 mg total) by mouth 2 (two) times daily. 60 each 1  . benztropine (COGENTIN) 0.5 MG tablet Take 0.5 mg by mouth every 12 (twelve) hours.    . brimonidine (ALPHAGAN P) 0.1 % SOLN Apply 1 drop to eye at bedtime. Both eyes    . Calcium Carbonate-Vitamin D (CALCIUM-VITAMIN D) 500-200 MG-UNIT per tablet Take 1 tablet by mouth 2 (two) times daily with a meal.    . dexamethasone (DECADRON) 4 MG tablet Take 1 tablet (4 mg total) by mouth every 12 (twelve) hours. 60 tablet 0  . ergocalciferol (VITAMIN D2) 50000 UNITS capsule Take 50,000 Units by mouth once a week. Every Saturday.    . ferrous sulfate 325 (65 FE) MG tablet Take 1 tablet (325 mg total) by mouth 2 (two) times daily with a meal. 60 tablet 1  . HYDROcodone-acetaminophen (NORCO/VICODIN) 5-325 MG per tablet Take 1 tablet by mouth every 6 (six) hours as needed. 30 tablet 0  . ibuprofen (ADVIL,MOTRIN) 600 MG tablet Take 600 mg by mouth as needed.    . pantoprazole (PROTONIX) 40 MG tablet Take 1 tablet (40 mg total) by mouth daily. Take while on decadron 30 tablet 0  . travoprost, benzalkonium, (TRAVATAN) 0.004 % ophthalmic solution Place 1 drop into both eyes at bedtime.     . enzalutamide (XTANDI) 40 MG capsule Take 4 capsules (160 mg total) by mouth daily. 120 capsule 0   No current facility-administered medications for this visit.     Allergies: No Known  Allergies  Past Medical History, Surgical history, Social history, and Family History were reviewed and updated.   Physical Exam: Blood pressure 130/69, pulse 86, temperature 97.5 F (36.4 C), temperature source Oral, resp. rate 20, height 6' (1.829 m), weight 249 lb 3.2 oz (113.036 kg). ECOG: 1 General appearance: alert and cooperative not in any distress. Head: Normocephalic, without obvious abnormality Neck: no adenopathy Lymph nodes: Cervical,  supraclavicular, and axillary nodes normal. Heart:regular rate and rhythm, S1, S2 normal, no murmur, click, rub or gallop Lung:chest clear, no wheezing, rales, normal symmetric air entry Abdomin: soft, non-tender, without masses or organomegaly EXT:no erythema, induration, or nodules Neurological: No deficits noted.  Lab Results: Lab Results  Component Value Date   WBC 9.2 05/29/2014   HGB 9.4* 05/29/2014   HCT 29.5* 05/29/2014   MCV 80.8 05/29/2014   PLT 339 05/29/2014     Chemistry      Component Value Date/Time   NA 138 04/24/2014 1512   NA 136* 02/10/2014 0516   K 4.3 04/24/2014 1512   K 5.0 02/10/2014 0516   CL 95* 02/10/2014 0516   CO2 21* 04/24/2014 1512   CO2 27 02/10/2014 0516   BUN 15.4 04/24/2014 1512   BUN 37* 02/10/2014 0516   CREATININE 0.7 04/24/2014 1512   CREATININE 0.83 02/10/2014 0516      Component Value Date/Time   CALCIUM 8.7 04/24/2014 1512   CALCIUM 8.4 02/10/2014 0516   ALKPHOS 279* 04/24/2014 1512   ALKPHOS 144* 02/05/2014 0501   AST 12 04/24/2014 1512   AST 26 02/05/2014 0501   ALT 7 04/24/2014 1512   ALT 12 02/05/2014 0501   BILITOT 0.32 04/24/2014 1512   BILITOT 0.5 02/05/2014 0501       Results for Clarence Pollard (MRN [id number]) as of 05/29/2014 15:41  Ref. Range 04/24/2014 15:13  PSA Latest Range: <=4.00 ng/mL 37.51 (H)    Impression and Plan:  74 year old gentleman with the following issues:  1. Advanced prostate cancer with metastatic disease to the bone. He is currently on Xtandi since January 2016 and have tolerated it well. His PSA is currently pending from today and depending on the results of that we'll determine whether to continue on the current dose and schedule. He has tolerated it well and clinically doing better without any signs of progression of disease..  2. Androgen depravation: I recommended he continue on Lupron every 3 months with Clarence Pollard.   3. Bone health: I encouraged him to continue with his calcium  supplements as well as monthly Xgeva.   4. Follow-up: Will be in one month to assess any complications from this medication and to recheck his PSA.  Clarence Button, MD 3/11/20163:38 PM

## 2014-05-30 LAB — PSA: PSA: 28.31 ng/mL — ABNORMAL HIGH (ref <=4.00)

## 2014-06-01 ENCOUNTER — Telehealth: Payer: Self-pay | Admitting: *Deleted

## 2014-06-01 NOTE — Telephone Encounter (Signed)
Patient POA,  Clarence Pollard, called requesting "test results" for [name].

## 2014-06-01 NOTE — Progress Notes (Signed)
Spoke with amy williams, patient's daughter and power of attorney, gave results of PSA. Amy states patient is now in assisted living, no longer able to live alone.

## 2014-06-01 NOTE — Telephone Encounter (Signed)
-----   Message from Wyatt Portela, MD sent at 06/01/2014  8:58 AM EDT ----- Please call him with his PSA. Down to 80

## 2014-06-26 ENCOUNTER — Telehealth: Payer: Self-pay | Admitting: Oncology

## 2014-06-26 ENCOUNTER — Other Ambulatory Visit (HOSPITAL_BASED_OUTPATIENT_CLINIC_OR_DEPARTMENT_OTHER): Payer: Medicare Other

## 2014-06-26 ENCOUNTER — Ambulatory Visit (HOSPITAL_BASED_OUTPATIENT_CLINIC_OR_DEPARTMENT_OTHER): Payer: Medicare Other | Admitting: Oncology

## 2014-06-26 VITALS — BP 125/66 | HR 84 | Temp 97.1°F | Resp 18 | Ht 72.0 in | Wt 254.7 lb

## 2014-06-26 DIAGNOSIS — C61 Malignant neoplasm of prostate: Secondary | ICD-10-CM | POA: Diagnosis not present

## 2014-06-26 DIAGNOSIS — C7951 Secondary malignant neoplasm of bone: Secondary | ICD-10-CM | POA: Diagnosis not present

## 2014-06-26 DIAGNOSIS — E291 Testicular hypofunction: Secondary | ICD-10-CM | POA: Diagnosis not present

## 2014-06-26 LAB — COMPREHENSIVE METABOLIC PANEL (CC13)
ALBUMIN: 2.9 g/dL — AB (ref 3.5–5.0)
ALT: <6 U/L (ref 0–55)
AST: 11 U/L (ref 5–34)
Alkaline Phosphatase: 289 U/L — ABNORMAL HIGH (ref 40–150)
Anion Gap: 12 mEq/L — ABNORMAL HIGH (ref 3–11)
BILIRUBIN TOTAL: 0.36 mg/dL (ref 0.20–1.20)
BUN: 12.6 mg/dL (ref 7.0–26.0)
CO2: 23 mEq/L (ref 22–29)
Calcium: 8.2 mg/dL — ABNORMAL LOW (ref 8.4–10.4)
Chloride: 106 mEq/L (ref 98–109)
Creatinine: 0.8 mg/dL (ref 0.7–1.3)
EGFR: 89 mL/min/{1.73_m2} — ABNORMAL LOW (ref >90)
Glucose: 109 mg/dl (ref 70–140)
POTASSIUM: 3.6 meq/L (ref 3.5–5.1)
Sodium: 141 mEq/L (ref 136–145)
TOTAL PROTEIN: 7.3 g/dL (ref 6.4–8.3)

## 2014-06-26 LAB — CBC WITH DIFFERENTIAL/PLATELET
BASO%: 0.8 % (ref 0.0–2.0)
Basophils Absolute: 0.1 10*3/uL (ref 0.0–0.1)
EOS%: 7.2 % — AB (ref 0.0–7.0)
Eosinophils Absolute: 0.7 10*3/uL — ABNORMAL HIGH (ref 0.0–0.5)
HCT: 30.3 % — ABNORMAL LOW (ref 38.4–49.9)
HEMOGLOBIN: 9.3 g/dL — AB (ref 13.0–17.1)
LYMPH%: 18.1 % (ref 14.0–49.0)
MCH: 24.9 pg — AB (ref 27.2–33.4)
MCHC: 30.8 g/dL — AB (ref 32.0–36.0)
MCV: 80.8 fL (ref 79.3–98.0)
MONO#: 1.1 10*3/uL — ABNORMAL HIGH (ref 0.1–0.9)
MONO%: 10.6 % (ref 0.0–14.0)
NEUT%: 63.3 % (ref 39.0–75.0)
NEUTROS ABS: 6.3 10*3/uL (ref 1.5–6.5)
Platelets: 338 10*3/uL (ref 140–400)
RBC: 3.75 10*6/uL — ABNORMAL LOW (ref 4.20–5.82)
RDW: 21 % — ABNORMAL HIGH (ref 11.0–14.6)
WBC: 9.9 10*3/uL (ref 4.0–10.3)
lymph#: 1.8 10*3/uL (ref 0.9–3.3)

## 2014-06-26 NOTE — Progress Notes (Signed)
Hematology and Oncology Follow Up Visit  Clarence Pollard [id number] [birth year] 74 y.o. 06/26/2014 4:07 PM [name], MDElkins, Curt Jews, *   Principle Diagnosis: 74 year old gentleman with prostate cancer diagnosed in July of 2014. He presented with a Gleason score 5+4 = 9 and a PSA of 34.6. 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   Prior Therapy: He is status post radiation therapy to the thoracic spine between T7 and T8-11. Therapy concluded in August of 2014. He is status post T8-T10 thoracic laminectomy and epidural tumor decompression done on 01/29/2014. He is status post radiation therapy completed on 02/25/2014 to the thoracic spine between T8 and T10.  Current therapy: Xtandi 160 mg daily started in January 2016. Lupron given every 3 months under the care of Dr. Janice Norrie. Clarence Pollard given monthly under the care of Dr. Janice Norrie  Interim History:  Clarence Pollard presents today for a followup visit. Since the last visit, he continues to do very well and improve slowly. He is currently residing at an assisted living facility and managing well. His mobility is much improved without any falls or syncope. He continues to be on Xtandi and tolerated it without any major complications. He does not report any other side effects from this medication.  Has not reported any abdominal discomfort. He is eating well without any decline in his appetite. He is driving short distances as well. Has not reported any hematochezia or melena. He has not reported any skeletal pain anywhere else at this point and occasionally uses Advil for pain. Has not reported any genitourinary complaints. Rest of his review of system is unremarkable.  Medications: I have reviewed the patient's current medications.  Current Outpatient Prescriptions  Medication Sig Dispense Refill  . benztropine (COGENTIN) 0.5 MG tablet Take 0.5 mg by mouth every 12 (twelve) hours.    . brimonidine (ALPHAGAN  P) 0.1 % SOLN Apply 1 drop to eye at bedtime. Both eyes    . Calcium Carbonate-Vitamin D (CALCIUM-VITAMIN D) 500-200 MG-UNIT per tablet Take 1 tablet by mouth 2 (two) times daily with a meal.    . dexamethasone (DECADRON) 4 MG tablet Take 1 tablet (4 mg total) by mouth every 12 (twelve) hours. 60 tablet 0  . enzalutamide (XTANDI) 40 MG capsule Take 4 capsules (160 mg total) by mouth daily. 120 capsule 0  . ergocalciferol (VITAMIN D2) 50000 UNITS capsule Take 50,000 Units by mouth once a week. Every Saturday.    . ferrous sulfate 325 (65 FE) MG tablet Take 1 tablet (325 mg total) by mouth 2 (two) times daily with a meal. 60 tablet 1  . HYDROcodone-acetaminophen (NORCO/VICODIN) 5-325 MG per tablet Take 1 tablet by mouth every 6 (six) hours as needed. 30 tablet 0  . ibuprofen (ADVIL,MOTRIN) 600 MG tablet Take 600 mg by mouth as needed.    . pantoprazole (PROTONIX) 40 MG tablet Take 1 tablet (40 mg total) by mouth daily. Take while on decadron 30 tablet 0  . travoprost, benzalkonium, (TRAVATAN) 0.004 % ophthalmic solution Place 1 drop into both eyes at bedtime.      No current facility-administered medications for this visit.     Allergies: No Known Allergies  Past Medical History, Surgical history, Social history, and Family History were reviewed and updated.   Physical Exam: Blood pressure 125/66, pulse 84, temperature 97.1 F (36.2 C), temperature source Oral, resp. rate 18, height 6' (1.829 m), weight 254 lb 11.2 oz (115.531 kg), SpO2 100 %. ECOG: 1  General appearance: alert and cooperative not in any distress. Head: Normocephalic, without obvious abnormality Neck: no adenopathy Lymph nodes: Cervical, supraclavicular, and axillary nodes normal. Heart:regular rate and rhythm, S1, S2 normal, no murmur, click, rub or gallop Lung:chest clear, no wheezing, rales, normal symmetric air entry Abdomin: soft, non-tender, without masses or organomegaly EXT:no erythema, induration, or  nodules Neurological: No deficits noted.  Lab Results: Lab Results  Component Value Date   WBC 9.9 06/26/2014   HGB 9.3* 06/26/2014   HCT 30.3* 06/26/2014   MCV 80.8 06/26/2014   PLT 338 06/26/2014     Chemistry      Component Value Date/Time   NA 137 05/29/2014 1502   NA 136* 02/10/2014 0516   K 4.0 05/29/2014 1502   K 5.0 02/10/2014 0516   CL 95* 02/10/2014 0516   CO2 22 05/29/2014 1502   CO2 27 02/10/2014 0516   BUN 10.6 05/29/2014 1502   BUN 37* 02/10/2014 0516   CREATININE 0.7 05/29/2014 1502   CREATININE 0.83 02/10/2014 0516      Component Value Date/Time   CALCIUM 8.0* 05/29/2014 1502   CALCIUM 8.4 02/10/2014 0516   ALKPHOS 296* 05/29/2014 1502   ALKPHOS 144* 02/05/2014 0501   AST 9 05/29/2014 1502   AST 26 02/05/2014 0501   ALT 7 05/29/2014 1502   ALT 12 02/05/2014 0501   BILITOT 0.47 05/29/2014 1502   BILITOT 0.5 02/05/2014 0501       Results for Clarence Pollard, Clarence Pollard (MRN [id number]) as of 06/26/2014 16:10  Ref. Range 04/24/2014 15:13 05/29/2014 15:03  PSA Latest Range: <=4.00 ng/mL 37.51 (H) 28.31 (H)     Impression and Plan:  74 year old gentleman with the following issues:  1. Advanced prostate cancer with metastatic disease to the bone. He is currently on Xtandi since January 2016 and have tolerated it well. His PSA is has dropped down the last 2 months of therapy. He has tolerated it well and the plan is to continue the current dose and schedule.  2. Androgen depravation: I recommended he continue on Lupron every 3 months with Dr. Janice Norrie.   3. Bone health: I encouraged him to continue with his calcium supplements as well as monthly Xgeva.   4. Follow-up: Will be in one month to assess any complications from this medication and to recheck his PSA.  [facility], MD 4/8/20164:07 PM

## 2014-06-26 NOTE — Telephone Encounter (Signed)
Gave avs & calendar for May. °

## 2014-06-27 LAB — PSA: PSA: 37.29 ng/mL — ABNORMAL HIGH (ref <=4.00)

## 2014-06-29 ENCOUNTER — Telehealth: Payer: Self-pay | Admitting: *Deleted

## 2014-06-29 NOTE — Progress Notes (Signed)
Spoke with patient, gave results of last PSA. Instructed to stay on Xtandi, per dr Alen Blew.. Patient verbalized understanding.

## 2014-06-29 NOTE — Telephone Encounter (Signed)
-----   Message from Wyatt Portela, MD sent at 06/29/2014 10:17 AM EDT ----- Please call his PSA. Up from last month, same as it was two months ago. Stay on [place] for now.

## 2014-07-01 ENCOUNTER — Other Ambulatory Visit: Payer: Self-pay | Admitting: Oncology

## 2014-07-09 ENCOUNTER — Telehealth: Payer: Self-pay

## 2014-07-09 NOTE — Telephone Encounter (Signed)
Amy called asking for different type of pain med for her father.

## 2014-07-09 NOTE — Telephone Encounter (Signed)
S/w Amy: pcp took him off ibuprofen 600 d/t too rough on him, put him on 650 aspirin. pcp also gave him flu and pneumonia shot last week. She never had vicodin rx filled in January because the ibuprofen were working. They filled vicodin 07/07/14 and started 1 tab q6hr prn. It is helping some, not completely taking pain away but lasts only about 5 hours. Been having hot flashes and chills as well. Today taking him out to lunch completely wore him out. He is not going down to dining room for food but eating little boxes of cereal in his appt. Amy is asking about increasing vicodin or changing the Rx.

## 2014-07-14 ENCOUNTER — Telehealth: Payer: Self-pay | Admitting: *Deleted

## 2014-07-14 ENCOUNTER — Other Ambulatory Visit: Payer: Self-pay | Admitting: *Deleted

## 2014-07-14 MED ORDER — HYDROCODONE-ACETAMINOPHEN 5-325 MG PO TABS: 1.0000 | ORAL_TABLET | Freq: Four times a day (QID) | ORAL | Status: DC | PRN

## 2014-07-14 NOTE — Telephone Encounter (Signed)
Refill script for vicodin left at front for p/u. Daughter notified.

## 2014-07-14 NOTE — Telephone Encounter (Signed)
TC for patient's daughter, Gabriela Eves.  She states he will be needing refill on his vicodin. It was last filled on 07/06/14 and he is taking 2-3 tablets a day at this time. May need more than 30 tablets at a time so it will last longer than a week or so. He has about 12 tablets left. Call daughter Gabriela Eves @ [phone] when prescription is ready for pick up.

## 2014-07-24 ENCOUNTER — Encounter: Payer: Self-pay | Admitting: Radiation Therapy

## 2014-07-24 NOTE — Progress Notes (Signed)
Pt asked that he not have to come in for follow-up appointments with his Radiation Oncologist, Dr. Lisbeth Renshaw, since he is continuing care with his Medical Oncologist. His future appointment has been cancelled and we will see him again PRN.    Mont Dutton

## 2014-07-27 ENCOUNTER — Ambulatory Visit: Payer: Medicare Other | Admitting: Radiation Oncology

## 2014-07-27 ENCOUNTER — Inpatient Hospital Stay: Admission: RE | Admit: 2014-07-27 | Payer: Medicare Other | Source: Ambulatory Visit | Admitting: Radiation Oncology

## 2014-07-27 ENCOUNTER — Ambulatory Visit: Admission: RE | Admit: 2014-07-27 | Payer: Medicare Other | Source: Ambulatory Visit | Admitting: Radiation Oncology

## 2014-07-28 ENCOUNTER — Other Ambulatory Visit (HOSPITAL_BASED_OUTPATIENT_CLINIC_OR_DEPARTMENT_OTHER): Payer: Medicare Other

## 2014-07-28 ENCOUNTER — Telehealth: Payer: Self-pay | Admitting: Oncology

## 2014-07-28 ENCOUNTER — Ambulatory Visit (HOSPITAL_BASED_OUTPATIENT_CLINIC_OR_DEPARTMENT_OTHER): Payer: Medicare Other | Admitting: Oncology

## 2014-07-28 VITALS — BP 132/67 | HR 91 | Temp 97.7°F | Resp 18 | Ht 72.0 in | Wt 252.0 lb

## 2014-07-28 DIAGNOSIS — C7951 Secondary malignant neoplasm of bone: Secondary | ICD-10-CM

## 2014-07-28 DIAGNOSIS — C61 Malignant neoplasm of prostate: Secondary | ICD-10-CM | POA: Diagnosis not present

## 2014-07-28 DIAGNOSIS — E291 Testicular hypofunction: Secondary | ICD-10-CM | POA: Diagnosis not present

## 2014-07-28 LAB — COMPREHENSIVE METABOLIC PANEL (CC13)
ALT: 6 U/L (ref 0–55)
ANION GAP: 10 meq/L (ref 3–11)
AST: 13 U/L (ref 5–34)
Albumin: 3 g/dL — ABNORMAL LOW (ref 3.5–5.0)
Alkaline Phosphatase: 455 U/L — ABNORMAL HIGH (ref 40–150)
BUN: 13.9 mg/dL (ref 7.0–26.0)
CO2: 24 meq/L (ref 22–29)
CREATININE: 0.8 mg/dL (ref 0.7–1.3)
Calcium: 9 mg/dL (ref 8.4–10.4)
Chloride: 106 mEq/L (ref 98–109)
GLUCOSE: 113 mg/dL (ref 70–140)
Potassium: 4.2 mEq/L (ref 3.5–5.1)
Sodium: 140 mEq/L (ref 136–145)
Total Bilirubin: 0.23 mg/dL (ref 0.20–1.20)
Total Protein: 7.4 g/dL (ref 6.4–8.3)

## 2014-07-28 LAB — CBC WITH DIFFERENTIAL/PLATELET
BASO%: 0.4 % (ref 0.0–2.0)
Basophils Absolute: 0 10*3/uL (ref 0.0–0.1)
EOS%: 4.9 % (ref 0.0–7.0)
Eosinophils Absolute: 0.5 10*3/uL (ref 0.0–0.5)
HEMATOCRIT: 31 % — AB (ref 38.4–49.9)
HEMOGLOBIN: 9.9 g/dL — AB (ref 13.0–17.1)
LYMPH%: 21.7 % (ref 14.0–49.0)
MCH: 25.2 pg — ABNORMAL LOW (ref 27.2–33.4)
MCHC: 31.8 g/dL — ABNORMAL LOW (ref 32.0–36.0)
MCV: 79.3 fL (ref 79.3–98.0)
MONO#: 0.8 10*3/uL (ref 0.1–0.9)
MONO%: 8.9 % (ref 0.0–14.0)
NEUT%: 64.1 % (ref 39.0–75.0)
NEUTROS ABS: 6.1 10*3/uL (ref 1.5–6.5)
PLATELETS: 406 10*3/uL — AB (ref 140–400)
RBC: 3.91 10*6/uL — ABNORMAL LOW (ref 4.20–5.82)
RDW: 18.5 % — ABNORMAL HIGH (ref 11.0–14.6)
WBC: 9.6 10*3/uL (ref 4.0–10.3)
lymph#: 2.1 10*3/uL (ref 0.9–3.3)

## 2014-07-28 NOTE — Progress Notes (Signed)
Hematology and Oncology Follow Up Visit  Clarence Pollard [id number] 1940-05-20 74 y.o. 07/28/2014 12:20 PM Clarence Pollard, MDElkins, Curt Pollard, *   Principle Diagnosis: 74 year old gentleman with prostate cancer diagnosed in July of 2014. He presented with a Gleason score 5+4 = 9 and a PSA of 34.6. 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   Prior Therapy: He is status post radiation therapy to the thoracic spine between T7 and T8-11. Therapy concluded in August of 2014. He is status post T8-T10 thoracic laminectomy and epidural tumor decompression done on 01/29/2014. He is status post radiation therapy completed on 02/25/2014 to the thoracic spine between T8 and T10.  Current therapy: Xtandi 160 mg daily started in January 2016. Lupron given every 3 months under the care of Dr. Janice Norrie. [name] given monthly under the care of Dr. Janice Norrie  Interim History:  Clarence Pollard presents today for a followup visit. Since the last visit, he reports no complaints. He is currently residing at an assisted living facility and managing well. His mobility is stable using a walker without any falls or syncope. He continues to be on Xtandi and tolerated it without any major complications. He does not report any other side effects from this medication.  Has not reported any abdominal discomfort. He is eating well without any decline in his appetite. He is driving short distances as well. Has not reported any hematochezia or melena. Has not reported any genitourinary complaints. Rest of his review of system is unremarkable.  Medications: I have reviewed the patient's current medications.  Current Outpatient Prescriptions  Medication Sig Dispense Refill  . benztropine (COGENTIN) 0.5 MG tablet Take 0.5 mg by mouth every 12 (twelve) hours.    . brimonidine (ALPHAGAN P) 0.1 % SOLN Apply 1 drop to eye at bedtime. Both eyes    . Calcium Carbonate-Vitamin D (CALCIUM-VITAMIN D)  500-200 MG-UNIT per tablet Take 1 tablet by mouth 2 (two) times daily with a meal.    . dexamethasone (DECADRON) 4 MG tablet Take 1 tablet (4 mg total) by mouth every 12 (twelve) hours. 60 tablet 0  . ergocalciferol (VITAMIN D2) 50000 UNITS capsule Take 50,000 Units by mouth once a week. Every Saturday.    . ferrous sulfate 325 (65 FE) MG tablet Take 1 tablet (325 mg total) by mouth 2 (two) times daily with a meal. 60 tablet 1  . HYDROcodone-acetaminophen (NORCO/VICODIN) 5-325 MG per tablet Take 1-2 tablets by mouth every 6 (six) hours as needed. 60 tablet 0  . ibuprofen (ADVIL,MOTRIN) 600 MG tablet Take 600 mg by mouth as needed.    . pantoprazole (PROTONIX) 40 MG tablet Take 1 tablet (40 mg total) by mouth daily. Take while on decadron 30 tablet 0  . travoprost, benzalkonium, (TRAVATAN) 0.004 % ophthalmic solution Place 1 drop into both eyes at bedtime.     Gillermina Phy 40 MG capsule TAKE 4 CAPSULES BY MOUTH DAILY 120 capsule 0   No current facility-administered medications for this visit.     Allergies: No Known Allergies  Past Medical History, Surgical history, Social history, and Family History were reviewed and updated.   Physical Exam: Blood pressure 132/67, pulse 91, temperature 97.7 F (36.5 C), temperature source Oral, resp. rate 18, height 6' (1.829 m), weight 252 lb (114.306 kg), SpO2 98 %. ECOG: 1 General appearance: alert and cooperative not in any distress. Head: Normocephalic, without obvious abnormality Neck: no adenopathy Lymph nodes: Cervical, supraclavicular, and axillary nodes normal. Heart:regular rate  and rhythm, S1, S2 normal, no murmur, click, rub or gallop Lung:chest clear, no wheezing, rales, normal symmetric air entry Abdomin: soft, non-tender, without masses or organomegaly EXT:no erythema, induration, or nodules Neurological: No deficits noted.  Lab Results: Lab Results  Component Value Date   WBC 9.6 07/28/2014   HGB 9.9* 07/28/2014   HCT 31.0*  07/28/2014   MCV 79.3 07/28/2014   PLT 406* 07/28/2014     Chemistry      Component Value Date/Time   NA 141 06/26/2014 1535   NA 136* 02/10/2014 0516   K 3.6 06/26/2014 1535   K 5.0 02/10/2014 0516   CL 95* 02/10/2014 0516   CO2 23 06/26/2014 1535   CO2 27 02/10/2014 0516   BUN 12.6 06/26/2014 1535   BUN 37* 02/10/2014 0516   CREATININE 0.8 06/26/2014 1535   CREATININE 0.83 02/10/2014 0516      Component Value Date/Time   CALCIUM 8.2* 06/26/2014 1535   CALCIUM 8.4 02/10/2014 0516   ALKPHOS 289* 06/26/2014 1535   ALKPHOS 144* 02/05/2014 0501   AST 11 06/26/2014 1535   AST 26 02/05/2014 0501   ALT <6 06/26/2014 1535   ALT 12 02/05/2014 0501   BILITOT 0.36 06/26/2014 1535   BILITOT 0.5 02/05/2014 0501       Results for Clarence Pollard (MRN [id number]) as of 07/28/2014 11:50  Ref. Range 04/24/2014 15:13 05/29/2014 15:03 06/26/2014 15:35  PSA Latest Ref Range: <=4.00 ng/mL 37.51 (H) 28.31 (H) 37.29 (H)      Impression and Plan:  74 year old gentleman with the following issues:  1. Advanced prostate cancer with metastatic disease to the bone. He is currently on Xtandi since January 2016 and have tolerated it well. His PSA continues to be relatively stable at this time and I have recommended continuing the same dose and schedule. I discussed with him alternative options if his PSA starts to rise rapidly. These would include systemic chemotherapy in the form of Taxotere. I discussed with him the logistics of this medication in case we needed to use a different salvage regimen.  His disease appears stable and I see no need to change at this time.  2. Androgen depravation: I recommended he continue on Lupron every 3 months with Dr. Janice Norrie.   3. Bone health: I encouraged him to continue with his calcium supplements as well as monthly Xgeva.   4. Follow-up: Will be in one month to assess any complications from this medication and to recheck his PSA.  Clarence Button,  MD 5/10/201612:20 PM

## 2014-07-28 NOTE — Addendum Note (Signed)
Addended by: Randolm Idol on: 07/28/2014 01:11 PM   Modules accepted: Orders, Medications

## 2014-07-28 NOTE — Telephone Encounter (Signed)
Gave and printed appt sched and avs for pt for JUNE °

## 2014-07-29 ENCOUNTER — Telehealth: Payer: Self-pay | Admitting: *Deleted

## 2014-07-29 LAB — PSA: PSA: 57.68 ng/mL — ABNORMAL HIGH (ref <=4.00)

## 2014-07-29 NOTE — Telephone Encounter (Signed)
Spoke with patient. Gave results of latest PSA. Patient is to continue with same medication. Patient verbalized understanding.

## 2014-07-30 ENCOUNTER — Ambulatory Visit: Payer: Medicare Other | Admitting: Radiation Oncology

## 2014-07-31 ENCOUNTER — Other Ambulatory Visit: Payer: Self-pay | Admitting: *Deleted

## 2014-07-31 ENCOUNTER — Telehealth: Payer: Self-pay | Admitting: *Deleted

## 2014-07-31 MED ORDER — HYDROCODONE-ACETAMINOPHEN 5-325 MG PO TABS: 1.0000 | ORAL_TABLET | Freq: Four times a day (QID) | ORAL | Status: DC | PRN

## 2014-07-31 MED ORDER — ENZALUTAMIDE 40 MG PO CAPS: 160.0000 mg | ORAL_CAPSULE | Freq: Every day | ORAL | Status: DC

## 2014-07-31 NOTE — Telephone Encounter (Signed)
Shadad patient 

## 2014-07-31 NOTE — Telephone Encounter (Signed)
Left message on patient's answering machine to call me, re: scripts.

## 2014-07-31 NOTE — Telephone Encounter (Signed)
Spoke with patient, he will p/u script for pain medication, xtandi e[facility]scribed [facility].

## 2014-07-31 NOTE — Telephone Encounter (Signed)
TC from Amy williams regarding refill needs for Xtandi and Norco. Norco last filled on 07/14/14 (2.5 weeks ago) for 60 tablets.  Xtandi request is within acceptable timeframe.  Please advise.

## 2014-08-03 ENCOUNTER — Encounter: Payer: Self-pay | Admitting: *Deleted

## 2014-08-03 NOTE — Progress Notes (Signed)
Faxed signed clarification for medications to [facility] [phone]

## 2014-08-10 ENCOUNTER — Other Ambulatory Visit: Payer: Self-pay | Admitting: Oncology

## 2014-08-10 ENCOUNTER — Telehealth: Payer: Self-pay | Admitting: *Deleted

## 2014-08-10 ENCOUNTER — Encounter: Payer: Self-pay | Admitting: *Deleted

## 2014-08-10 MED ORDER — OXYCODONE HCL 5 MG PO TABS: 5.0000 mg | ORAL_TABLET | ORAL | Status: DC | PRN

## 2014-08-10 NOTE — Telephone Encounter (Signed)
TC from amy willaims requesting call back re: pain meds. Called back and spoke with her. She states that patient has only 2 pain pills (hydrocone 5/325)  left and needs refill. However, it was last filled on 07/31/14 and therefore has used 60 tabs in 10 days. Amy believes he needs something stronger as his pain in his back, hip and joints is getting worse as disease progresses.  Amy understands that Dr. Alen Blew is out of the office today but pt will need pain meds today.

## 2014-08-10 NOTE — Telephone Encounter (Signed)
Return call requesting how late he can pick up prescription.  Would like a return call.

## 2014-08-10 NOTE — Progress Notes (Unsigned)
Spoke with daughter, amy. per kristin curcio NP. Script  written for oxycodone 5 mg. 1-2 q 4 hours prn. He is to start with one tablet. If not helping, okay to  take another. Amy verbalized understanding.

## 2014-08-10 NOTE — Telephone Encounter (Signed)
Pt requesting refill of hydrocodone; will like call back when prescription is ready. Forwarded to Dr. Alen Blew and Rolly Salter RN

## 2014-08-19 ENCOUNTER — Telehealth: Payer: Self-pay | Admitting: *Deleted

## 2014-08-19 NOTE — Telephone Encounter (Signed)
This RN spoke with patient regarding pain medications. Patient stated,"around 10:00 pm last night, my legs and arms were aching all over. My muscles felt sore all over." I asked the patient if he was taking his pain medication every four hours or when he felt like it? Patient stated, "I took one around 11:00 am today, and I can go about six or seven hours before I really start hurting." I instructed patient to take the OxyIR 5mg  tablets every four hours. If the pain is severe, go ahead and take two tablets. Discussed with patient that once you have severe pain, it's hard to get it under control. Patient verbalized understanding. Instructed patient that if the pain got severe tonight go to the ER. Patient verbalized understanding. Instructed patient to call [facility] tomorrow to inform us how he did overnight, in case, he needs to be seen in [facility]. Patient verbalized understanding. Also, I told the patient that I was going to call his daughter, Amy, and tell her the same thing I told him. Patient verbalized understanding.

## 2014-08-19 NOTE — Telephone Encounter (Signed)
Dtr. Is calling to say that patient is having significant pain even with 2 oxycodone (5mg  each). every 4 hours.  His pain is in is legs, hips and radiating toward his stomach - rating it a 6 or 7.  This is new pain that has developed in last few weeks and the oxycodone is not giving him relief.  Please advise.  Dtr. Is Gabriela Eves @ [phone]. Please call dtr only back regarding this (per her request.)

## 2014-08-20 ENCOUNTER — Other Ambulatory Visit: Payer: Self-pay | Admitting: Oncology

## 2014-08-20 ENCOUNTER — Telehealth: Payer: Self-pay | Admitting: *Deleted

## 2014-08-20 DIAGNOSIS — C61 Malignant neoplasm of prostate: Secondary | ICD-10-CM

## 2014-08-20 MED ORDER — MORPHINE SULFATE ER 15 MG PO TBCR: 15.0000 mg | EXTENDED_RELEASE_TABLET | Freq: Two times a day (BID) | ORAL | Status: DC

## 2014-08-20 MED ORDER — OXYCODONE HCL 5 MG PO TABS: 5.0000 mg | ORAL_TABLET | ORAL | Status: AC | PRN

## 2014-08-20 NOTE — Telephone Encounter (Signed)
TC from pt's daughter requesting refill of oxycodone 5 mg. Pt is taking 2 tablets every 6 hours ATC because the pain in his legs and hips is worsening. He cannot sit for very long even in a softer chair because the pain in his hips becomes unbearable. Last oxycodone was filled 08/10/14 for 60 tabs which would last him 7.5 days. He has enough for tonight but will need refill tomorrow.  Perhaps he now needs long acting pain med along with oxycodone for breakthrough pain. Call daughter Amy when prescription(s) available to pick up tomorrow, 08/21/14

## 2014-08-21 NOTE — Telephone Encounter (Signed)
This RN called and left a message for Clarence Pollard, daughter, that there are two prescriptions ready for pick up today. Instructed her to call [phone] if she had any questions.

## 2014-09-01 ENCOUNTER — Other Ambulatory Visit (HOSPITAL_BASED_OUTPATIENT_CLINIC_OR_DEPARTMENT_OTHER): Payer: Medicare Other

## 2014-09-01 ENCOUNTER — Ambulatory Visit (HOSPITAL_BASED_OUTPATIENT_CLINIC_OR_DEPARTMENT_OTHER): Payer: Medicare Other | Admitting: Oncology

## 2014-09-01 ENCOUNTER — Telehealth: Payer: Self-pay | Admitting: Oncology

## 2014-09-01 VITALS — BP 124/57 | HR 89 | Temp 98.0°F | Resp 18 | Ht 72.0 in | Wt 240.8 lb

## 2014-09-01 DIAGNOSIS — C7951 Secondary malignant neoplasm of bone: Secondary | ICD-10-CM | POA: Diagnosis not present

## 2014-09-01 DIAGNOSIS — E291 Testicular hypofunction: Secondary | ICD-10-CM | POA: Diagnosis not present

## 2014-09-01 DIAGNOSIS — R52 Pain, unspecified: Secondary | ICD-10-CM

## 2014-09-01 DIAGNOSIS — C61 Malignant neoplasm of prostate: Secondary | ICD-10-CM | POA: Diagnosis not present

## 2014-09-01 LAB — COMPREHENSIVE METABOLIC PANEL (CC13)
ALK PHOS: 852 U/L — AB (ref 40–150)
ALT: 7 U/L (ref 0–55)
AST: 14 U/L (ref 5–34)
Albumin: 2.8 g/dL — ABNORMAL LOW (ref 3.5–5.0)
Anion Gap: 12 mEq/L — ABNORMAL HIGH (ref 3–11)
BUN: 14.5 mg/dL (ref 7.0–26.0)
CO2: 25 meq/L (ref 22–29)
Calcium: 9.3 mg/dL (ref 8.4–10.4)
Chloride: 101 mEq/L (ref 98–109)
Creatinine: 0.9 mg/dL (ref 0.7–1.3)
EGFR: 85 mL/min/{1.73_m2} — ABNORMAL LOW (ref >90)
Glucose: 126 mg/dl (ref 70–140)
Potassium: 4.3 mEq/L (ref 3.5–5.1)
SODIUM: 139 meq/L (ref 136–145)
TOTAL PROTEIN: 7.7 g/dL (ref 6.4–8.3)
Total Bilirubin: 0.42 mg/dL (ref 0.20–1.20)

## 2014-09-01 LAB — CBC WITH DIFFERENTIAL/PLATELET
BASO%: 0.2 % (ref 0.0–2.0)
BASOS ABS: 0 10*3/uL (ref 0.0–0.1)
EOS ABS: 0.2 10*3/uL (ref 0.0–0.5)
EOS%: 2.8 % (ref 0.0–7.0)
HCT: 31.1 % — ABNORMAL LOW (ref 38.4–49.9)
HGB: 9.7 g/dL — ABNORMAL LOW (ref 13.0–17.1)
LYMPH#: 2 10*3/uL (ref 0.9–3.3)
LYMPH%: 23.1 % (ref 14.0–49.0)
MCH: 24.6 pg — ABNORMAL LOW (ref 27.2–33.4)
MCHC: 31.2 g/dL — ABNORMAL LOW (ref 32.0–36.0)
MCV: 78.7 fL — AB (ref 79.3–98.0)
MONO#: 0.7 10*3/uL (ref 0.1–0.9)
MONO%: 8.1 % (ref 0.0–14.0)
NEUT%: 65.8 % (ref 39.0–75.0)
NEUTROS ABS: 5.6 10*3/uL (ref 1.5–6.5)
Platelets: 307 10*3/uL (ref 140–400)
RBC: 3.95 10*6/uL — ABNORMAL LOW (ref 4.20–5.82)
RDW: 16.8 % — AB (ref 11.0–14.6)
WBC: 8.5 10*3/uL (ref 4.0–10.3)

## 2014-09-01 MED ORDER — NYSTATIN-TRIAMCINOLONE 100000-0.1 UNIT/GM-% EX OINT: 1.0000 "application " | TOPICAL_OINTMENT | Freq: Two times a day (BID) | CUTANEOUS | Status: DC

## 2014-09-01 NOTE — Telephone Encounter (Signed)
Gave adn printed appt sched and avs for pt for July  °

## 2014-09-01 NOTE — Progress Notes (Signed)
Hematology and Oncology Follow Up Visit  Clarence Pollard [id number] 05-27-1940 74 y.o. 09/01/2014 12:16 PM [name], MDElkins, Curt Jews, *   Principle Diagnosis: 74 year old gentleman with prostate cancer diagnosed in July of 2014. He presented with a Gleason score 5+4 = 9 and a PSA of 34.6. 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   Prior Therapy: He is status post radiation therapy to the thoracic spine between T7 and T8-11. Therapy concluded in August of 2014. He is status post T8-T10 thoracic laminectomy and epidural tumor decompression done on 01/29/2014. He is status post radiation therapy completed on 02/25/2014 to the thoracic spine between T8 and T10.  Current therapy: Xtandi 160 mg daily started in January 2016. Lupron given every 3 months under the care of Dr. Janice Norrie. [name] given monthly under the care of Dr. Janice Norrie  Interim History:  Clarence Pollard presents today for a followup visit with his daughter. Since the last visit, he developed worsening diffuse bony pain which was not relieved by short acting oxycodone. He started long-acting morphine at 15 mg twice a day and report much improvement in his symptoms. He takes breakthrough oxycodone 1-2 times a day. His mobility is about the same and his quality of life have not changed. He is currently residing at an assisted living facility and managing well. He continues to be on Xtandi and tolerated it without any major complications. He does not report any other side effects from this medication.  Has not reported any abdominal discomfort but has reported some erythema in his abdominal wall. He is eating well without any decline in his appetite. He is driving short distances as well. Has not reported any hematochezia or melena. Has not reported any genitourinary complaints. Rest of his review of system is unremarkable.  Medications: I have reviewed the patient's current medications.  Current  Outpatient Prescriptions  Medication Sig Dispense Refill  . brimonidine (ALPHAGAN P) 0.1 % SOLN Apply 1 drop to eye at bedtime. Both eyes    . Calcium Carbonate-Vitamin D (CALCIUM-VITAMIN D) 500-200 MG-UNIT per tablet Take 1 tablet by mouth 2 (two) times daily with a meal. 1200 mg calcium and 1000 mg vitamin D    . enzalutamide (XTANDI) 40 MG capsule Take 4 capsules (160 mg total) by mouth daily. 120 capsule 0  . ergocalciferol (VITAMIN D2) 50000 UNITS capsule Take 50,000 Units by mouth once a week. Every Saturday.    . ferrous sulfate 325 (65 FE) MG tablet Take 1 tablet (325 mg total) by mouth 2 (two) times daily with a meal. 60 tablet 1  . HYDROcodone-acetaminophen (NORCO/VICODIN) 5-325 MG per tablet Take 1-2 tablets by mouth every 6 (six) hours as needed. (Patient taking differently: Take 1-2 tablets by mouth every 6 (six) hours as needed. 1 or 2 tablets every 6 hours prn pain) 60 tablet 0  . morphine (MSIR) 15 MG tablet Take 15 mg by mouth every 12 (twelve) hours.    Marland Kitchen oxyCODONE (OXY IR/ROXICODONE) 5 MG immediate release tablet Take 1 tablet (5 mg total) by mouth every 4 (four) hours as needed for severe pain. 90 tablet 0  . travoprost, benzalkonium, (TRAVATAN) 0.004 % ophthalmic solution Place 1 drop into both eyes at bedtime.     Marland Kitchen nystatin-triamcinolone ointment (MYCOLOG) Apply 1 application topically 2 (two) times daily. 30 g 0   No current facility-administered medications for this visit.     Allergies: No Known Allergies  Past Medical History, Surgical history, Social  history, and Family History were reviewed and updated.   Physical Exam: Blood pressure 124/57, pulse 89, temperature 98 F (36.7 C), temperature source Oral, resp. rate 18, height 6' (1.829 m), weight 240 lb 12.8 oz (109.226 kg), SpO2 98 %. ECOG: 2 General appearance: alert and cooperative not in any distress. Chronically ill-appearing. Head: Normocephalic, without obvious abnormality Neck: no adenopathy Lymph  nodes: Cervical, supraclavicular, and axillary nodes normal. Heart:regular rate and rhythm, S1, S2 normal, no murmur, click, rub or gallop Lung:chest clear, no wheezing, rales, normal symmetric air entry Abdomin: soft, non-tender, without masses or organomegaly. Slight erythema noted in the abdominal wall. EXT:no erythema, induration, or nodules Neurological: No deficits noted.  Lab Results: Lab Results  Component Value Date   WBC 8.5 09/01/2014   HGB 9.7* 09/01/2014   HCT 31.1* 09/01/2014   MCV 78.7* 09/01/2014   PLT 307 09/01/2014     Chemistry      Component Value Date/Time   NA 140 07/28/2014 1144   NA 136* 02/10/2014 0516   K 4.2 07/28/2014 1144   K 5.0 02/10/2014 0516   CL 95* 02/10/2014 0516   CO2 24 07/28/2014 1144   CO2 27 02/10/2014 0516   BUN 13.9 07/28/2014 1144   BUN 37* 02/10/2014 0516   CREATININE 0.8 07/28/2014 1144   CREATININE 0.83 02/10/2014 0516      Component Value Date/Time   CALCIUM 9.0 07/28/2014 1144   CALCIUM 8.4 02/10/2014 0516   ALKPHOS 455* 07/28/2014 1144   ALKPHOS 144* 02/05/2014 0501   AST 13 07/28/2014 1144   AST 26 02/05/2014 0501   ALT 6 07/28/2014 1144   ALT 12 02/05/2014 0501   BILITOT 0.23 07/28/2014 1144   BILITOT 0.5 02/05/2014 0501       Results for NIGIL, BRAMAN (MRN [id number]) as of 07/28/2014 11:50  Ref. Range 04/24/2014 15:13 05/29/2014 15:03 06/26/2014 15:35  PSA Latest Ref Range: <=4.00 ng/mL 37.51 (H) 28.31 (H) 37.29 (H)      Impression and Plan:  74 year old gentleman with the following issues:  1. Advanced prostate cancer with metastatic disease to the bone. He is currently on Xtandi since January 2016 and have tolerated it well. His PSA have increased to 57 over the last month with a doubling time of less than 6 months. He is also experiencing some diffuse bone pain and required long-acting pain medication. I recommended repeat staging workup including a bone scan and a repeat PSA next month. His PSA continues  to rise then different salvage therapy will be needed. These options were discussed today including systemic chemotherapy and possibly Xofigo. Risks and benefits of these approaches were discussed as well as the logistics of administration of both. I also gave him the option of supportive care only and future treatment. He understands that he has an incurable malignancy and regardless of the treatment, these are all palliative options do hope to extend his overall survival to double digit months at best.  He'll consider these options in the next month and we'll keep him on Xtandi as it stands.   2. Androgen depravation: I recommended he continue on Lupron every 3 months with Dr. Janice Norrie.   3. Bone health: I encouraged him to continue with his calcium supplements as well as monthly Xgeva.   4. Pain: Seems to be improved with long-acting morphine at 15 mg twice a day with very little breakthrough pain medication at this time.  5. Follow-up: Will be in one month.  [facility], MD 6/14/201612:16  PM

## 2014-09-02 ENCOUNTER — Other Ambulatory Visit: Payer: Self-pay | Admitting: *Deleted

## 2014-09-02 ENCOUNTER — Telehealth: Payer: Self-pay | Admitting: *Deleted

## 2014-09-02 LAB — PSA: PSA: 94.41 ng/mL — AB (ref <=4.00)

## 2014-09-02 MED ORDER — NYSTATIN 100000 UNIT/GM EX OINT: 1.0000 "application " | TOPICAL_OINTMENT | Freq: Two times a day (BID) | CUTANEOUS | Status: AC

## 2014-09-02 MED ORDER — TRIAMCINOLONE ACETONIDE 0.025 % EX OINT: 1.0000 "application " | TOPICAL_OINTMENT | Freq: Two times a day (BID) | CUTANEOUS | Status: AC

## 2014-09-02 NOTE — Telephone Encounter (Signed)
-----   Message from Wyatt Portela, MD sent at 09/02/2014  8:24 AM EDT ----- Please let him know about his PSA. The plan is to continue with like we discussed yesterday.

## 2014-09-02 NOTE — Telephone Encounter (Signed)
Spoke with daughter amy, gave results of PSA done yesterday and let her know that medicare would not pay for combination of ointments as prescribed, must be two different scripts, will e-scribe to [facility].

## 2014-09-02 NOTE — Telephone Encounter (Signed)
Fax received that insurance prefers Clotrimazole or Ketoconazole.  [facility] on [address] requests change in order.  To provider in-basket for review.

## 2014-09-03 ENCOUNTER — Other Ambulatory Visit: Payer: Self-pay | Admitting: Oncology

## 2014-09-10 ENCOUNTER — Telehealth: Payer: Self-pay

## 2014-09-10 NOTE — Telephone Encounter (Signed)
Amy called stating his morphine is working only a little bit, makes him somewhat comfortable for maybe 6 hours. He will take it at the 11th hour b/c he can not wait for 12 hours for the next pill as prescribed. The morphine is MSIR.  And the oxycodone is not working at all. There are periods when he can barely walk and his back is hurting him really bad. Please call Amy back at [phone].

## 2014-09-11 NOTE — Telephone Encounter (Signed)
This RN spoke with patient regarding pain medications. Patient stated,"the morphine isn't working. I can't go twelve hours without pain medication." I asked," are you taking the Oxycodone Immediate Release 5 mg tablets every four hours for severe pain?" Patient stated,"No. I stopped taking those. I could go five or six hours without any pain medication before I really started hurting. Then, when I took one, I would still be hurting, so, those pills don't work." Discussed and reinforced with patient that once you have pain, it's hard to get it under control. Instructed patient that he should be taking the Oxycodone IR every four hours until the pain is under control. Patient verbalized understanding. Instructed patient that if pain got severe tonight or over the weekend, to go to the ER. Patient verbalized understanding. Also, I told the patient that I was going to call his daughter, Amy, and tell her the same thing I told him. Patient verbalized understanding.

## 2014-09-23 ENCOUNTER — Telehealth: Payer: Self-pay

## 2014-09-23 NOTE — Telephone Encounter (Signed)
Spoke with amy williams, daughter. Per dr Alen Blew, patient may have morphine every 8 hours. Will print up new script tomorrow and call amy when ready. Amy verbalized understanding.

## 2014-09-23 NOTE — Telephone Encounter (Signed)
Pt has been taking short acting oxycodone q 4 hours and short acting morphine q 12 hours. The pain is not under control. Barely able to walk around 1 bedroom studio appt. He is not going down stairs for meals. He is eating very little. Feet are swelling, up to above ankles, no pitting, feet are tingling and stinging. Pt is at [facility]. They do have graded levels of care.

## 2014-09-24 ENCOUNTER — Other Ambulatory Visit: Payer: Self-pay | Admitting: *Deleted

## 2014-09-24 DIAGNOSIS — C61 Malignant neoplasm of prostate: Secondary | ICD-10-CM

## 2014-09-24 MED ORDER — MORPHINE SULFATE 15 MG PO TABS: 15.0000 mg | ORAL_TABLET | Freq: Three times a day (TID) | ORAL | Status: DC | PRN

## 2014-09-29 ENCOUNTER — Other Ambulatory Visit: Payer: Self-pay | Admitting: Oncology

## 2014-09-29 ENCOUNTER — Telehealth: Payer: Self-pay

## 2014-09-29 DIAGNOSIS — C61 Malignant neoplasm of prostate: Secondary | ICD-10-CM

## 2014-09-29 MED ORDER — MORPHINE SULFATE 15 MG PO TABS: 15.0000 mg | ORAL_TABLET | ORAL | Status: AC | PRN

## 2014-09-29 MED ORDER — FUROSEMIDE 20 MG PO TABS: 20.0000 mg | ORAL_TABLET | Freq: Every day | ORAL | Status: AC

## 2014-09-29 NOTE — Telephone Encounter (Signed)
Patient's daughter states patient is still having some swelling in legs, not eating and having uncontrolled pain. Per Mikey Bussing patient should elevate legs, may use over the counter compression stockings Erasmo Downer will increase his morphine to 15mg  every 4 hours for pain.  Also prescribled lasix 20mg  per day for swelling #30.  Offered consultation with Dory Peru for nutrition but family feels this is not needed at this time.  They will pick up both prescriptions later today for the nursing home.  Will call back with any other questions or concerns.  Follow up appointment with Dr Alen Blew 10/06/13.

## 2014-10-01 ENCOUNTER — Encounter: Payer: Self-pay | Admitting: Oncology

## 2014-10-01 NOTE — Progress Notes (Signed)
I placed form for lift chair on desk of dr Alen Blew.

## 2014-10-05 ENCOUNTER — Telehealth: Payer: Self-pay

## 2014-10-05 NOTE — Telephone Encounter (Signed)
Pt is having a bone scan tomorrow. Daughter is asking for some kind of sedation. The pt is worried that he will not be able to lay still long enough for the scan d/t pain. His morphine every 4 hours is working for pain control.

## 2014-10-06 ENCOUNTER — Encounter (HOSPITAL_COMMUNITY): Payer: Medicare Other

## 2014-10-06 ENCOUNTER — Telehealth: Payer: Self-pay | Admitting: *Deleted

## 2014-10-06 ENCOUNTER — Encounter (HOSPITAL_COMMUNITY)
Admission: RE | Admit: 2014-10-06 | Discharge: 2014-10-06 | Disposition: A | Discharge status: Home / Self Care | Payer: Medicare Other | Source: Ambulatory Visit | Attending: Oncology | Admitting: Oncology

## 2014-10-06 ENCOUNTER — Other Ambulatory Visit (HOSPITAL_BASED_OUTPATIENT_CLINIC_OR_DEPARTMENT_OTHER): Payer: Medicare Other

## 2014-10-06 ENCOUNTER — Other Ambulatory Visit: Payer: Medicare Other

## 2014-10-06 DIAGNOSIS — C61 Malignant neoplasm of prostate: Secondary | ICD-10-CM | POA: Insufficient documentation

## 2014-10-06 LAB — CBC WITH DIFFERENTIAL/PLATELET
BASO%: 0.4 % (ref 0.0–2.0)
BASOS ABS: 0 10*3/uL (ref 0.0–0.1)
EOS%: 0.1 % (ref 0.0–7.0)
Eosinophils Absolute: 0 10*3/uL (ref 0.0–0.5)
HCT: 27.1 % — ABNORMAL LOW (ref 38.4–49.9)
HGB: 8.8 g/dL — ABNORMAL LOW (ref 13.0–17.1)
LYMPH%: 9.3 % — ABNORMAL LOW (ref 14.0–49.0)
MCH: 24 pg — ABNORMAL LOW (ref 27.2–33.4)
MCHC: 32.3 g/dL (ref 32.0–36.0)
MCV: 74.5 fL — ABNORMAL LOW (ref 79.3–98.0)
MONO#: 0.7 10*3/uL (ref 0.1–0.9)
MONO%: 7.9 % (ref 0.0–14.0)
NEUT#: 7.7 10*3/uL — ABNORMAL HIGH (ref 1.5–6.5)
NEUT%: 82.3 % — ABNORMAL HIGH (ref 39.0–75.0)
Platelets: 379 10*3/uL (ref 140–400)
RBC: 3.64 10*6/uL — AB (ref 4.20–5.82)
RDW: 20.2 % — ABNORMAL HIGH (ref 11.0–14.6)
WBC: 9.3 10*3/uL (ref 4.0–10.3)
lymph#: 0.9 10*3/uL (ref 0.9–3.3)

## 2014-10-06 LAB — COMPREHENSIVE METABOLIC PANEL (CC13)
ALBUMIN: 2.3 g/dL — AB (ref 3.5–5.0)
ALT: 7 U/L (ref 0–55)
AST: 29 U/L (ref 5–34)
Alkaline Phosphatase: 1383 U/L — ABNORMAL HIGH (ref 40–150)
Anion Gap: 13 mEq/L — ABNORMAL HIGH (ref 3–11)
BILIRUBIN TOTAL: 1.06 mg/dL (ref 0.20–1.20)
BUN: 11.5 mg/dL (ref 7.0–26.0)
CALCIUM: 9.8 mg/dL (ref 8.4–10.4)
CHLORIDE: 98 meq/L (ref 98–109)
CO2: 26 meq/L (ref 22–29)
CREATININE: 0.9 mg/dL (ref 0.7–1.3)
EGFR: 83 mL/min/{1.73_m2} — AB (ref >90)
Glucose: 116 mg/dl (ref 70–140)
Potassium: 3.8 mEq/L (ref 3.5–5.1)
Sodium: 137 mEq/L (ref 136–145)
TOTAL PROTEIN: 7.3 g/dL (ref 6.4–8.3)

## 2014-10-06 MED ORDER — TECHNETIUM TC 99M MEDRONATE IV KIT
25.4000 | PACK | Freq: Once | INTRAVENOUS | Status: AC | PRN
  Administered 2014-10-06: 25.4 via INTRAVENOUS

## 2014-10-06 NOTE — Telephone Encounter (Signed)
Daughter called reporting "dad is due for Morphine pain medicine and in [facility] radiology waiting room.  Would like to get morphine ordered for him because he doesn't have any.  Resident in assisted Living, was dropped off and now test will not be until 3:00 pm."  Called [facility] who report they do not administer medications.  Daughter "will leave work and administer morphine to him".

## 2014-10-07 ENCOUNTER — Inpatient Hospital Stay (HOSPITAL_COMMUNITY)
Admission: EM | Admit: 2014-10-07 | Discharge: 2014-10-14 | DRG: 092 | Disposition: A | Discharge status: Hospice | Payer: Medicare Other | Attending: Internal Medicine | Admitting: Internal Medicine

## 2014-10-07 ENCOUNTER — Encounter: Payer: Medicare Other | Admitting: Nurse Practitioner

## 2014-10-07 ENCOUNTER — Other Ambulatory Visit (HOSPITAL_BASED_OUTPATIENT_CLINIC_OR_DEPARTMENT_OTHER): Payer: Medicare Other

## 2014-10-07 ENCOUNTER — Ambulatory Visit: Payer: Medicare Other | Admitting: Oncology

## 2014-10-07 ENCOUNTER — Emergency Department (HOSPITAL_COMMUNITY): Payer: Medicare Other

## 2014-10-07 ENCOUNTER — Telehealth: Payer: Self-pay | Admitting: *Deleted

## 2014-10-07 ENCOUNTER — Other Ambulatory Visit: Payer: Self-pay | Admitting: *Deleted

## 2014-10-07 ENCOUNTER — Telehealth: Payer: Self-pay | Admitting: Oncology

## 2014-10-07 DIAGNOSIS — Z7901 Long term (current) use of anticoagulants: Secondary | ICD-10-CM

## 2014-10-07 DIAGNOSIS — R443 Hallucinations, unspecified: Secondary | ICD-10-CM | POA: Diagnosis not present

## 2014-10-07 DIAGNOSIS — E785 Hyperlipidemia, unspecified: Secondary | ICD-10-CM

## 2014-10-07 DIAGNOSIS — Z79891 Long term (current) use of opiate analgesic: Secondary | ICD-10-CM

## 2014-10-07 DIAGNOSIS — R5082 Postprocedural fever: Secondary | ICD-10-CM

## 2014-10-07 DIAGNOSIS — G92 Toxic encephalopathy: Principal | ICD-10-CM | POA: Diagnosis present

## 2014-10-07 DIAGNOSIS — E440 Moderate protein-calorie malnutrition: Secondary | ICD-10-CM | POA: Diagnosis present

## 2014-10-07 DIAGNOSIS — R0602 Shortness of breath: Secondary | ICD-10-CM

## 2014-10-07 DIAGNOSIS — G893 Neoplasm related pain (acute) (chronic): Secondary | ICD-10-CM | POA: Diagnosis present

## 2014-10-07 DIAGNOSIS — C61 Malignant neoplasm of prostate: Secondary | ICD-10-CM | POA: Diagnosis not present

## 2014-10-07 DIAGNOSIS — G9340 Encephalopathy, unspecified: Secondary | ICD-10-CM

## 2014-10-07 DIAGNOSIS — T402X5A Adverse effect of other opioids, initial encounter: Secondary | ICD-10-CM | POA: Diagnosis present

## 2014-10-07 DIAGNOSIS — Z86010 Personal history of colonic polyps: Secondary | ICD-10-CM

## 2014-10-07 DIAGNOSIS — R531 Weakness: Secondary | ICD-10-CM | POA: Diagnosis present

## 2014-10-07 DIAGNOSIS — R748 Abnormal levels of other serum enzymes: Secondary | ICD-10-CM | POA: Diagnosis present

## 2014-10-07 DIAGNOSIS — IMO0002 Reserved for concepts with insufficient information to code with codable children: Secondary | ICD-10-CM

## 2014-10-07 DIAGNOSIS — K219 Gastro-esophageal reflux disease without esophagitis: Secondary | ICD-10-CM | POA: Diagnosis present

## 2014-10-07 DIAGNOSIS — K5660 Unspecified intestinal obstruction: Secondary | ICD-10-CM | POA: Diagnosis present

## 2014-10-07 DIAGNOSIS — Z87891 Personal history of nicotine dependence: Secondary | ICD-10-CM

## 2014-10-07 DIAGNOSIS — K5900 Constipation, unspecified: Secondary | ICD-10-CM | POA: Diagnosis present

## 2014-10-07 DIAGNOSIS — Z800 Family history of malignant neoplasm of digestive organs: Secondary | ICD-10-CM

## 2014-10-07 DIAGNOSIS — Z79899 Other long term (current) drug therapy: Secondary | ICD-10-CM

## 2014-10-07 DIAGNOSIS — I10 Essential (primary) hypertension: Secondary | ICD-10-CM | POA: Diagnosis present

## 2014-10-07 DIAGNOSIS — Z952 Presence of prosthetic heart valve: Secondary | ICD-10-CM

## 2014-10-07 DIAGNOSIS — C7951 Secondary malignant neoplasm of bone: Secondary | ICD-10-CM | POA: Diagnosis present

## 2014-10-07 DIAGNOSIS — Z6830 Body mass index (BMI) 30.0-30.9, adult: Secondary | ICD-10-CM

## 2014-10-07 DIAGNOSIS — R509 Fever, unspecified: Secondary | ICD-10-CM | POA: Diagnosis present

## 2014-10-07 DIAGNOSIS — Z515 Encounter for palliative care: Secondary | ICD-10-CM

## 2014-10-07 DIAGNOSIS — Z66 Do not resuscitate: Secondary | ICD-10-CM

## 2014-10-07 DIAGNOSIS — K567 Ileus, unspecified: Secondary | ICD-10-CM

## 2014-10-07 LAB — CBC WITH DIFFERENTIAL/PLATELET
BASO%: 0.1 % (ref 0.0–2.0)
Basophils Absolute: 0 10*3/uL (ref 0.0–0.1)
Basophils Absolute: 0 10*3/uL (ref 0.0–0.1)
Basophils Relative: 0 % (ref 0–1)
EOS%: 0.3 % (ref 0.0–7.0)
Eosinophils Absolute: 0 10*3/uL (ref 0.0–0.5)
Eosinophils Absolute: 0 10*3/uL (ref 0.0–0.7)
Eosinophils Relative: 0 % (ref 0–5)
HCT: 28.5 % — ABNORMAL LOW (ref 39.0–52.0)
HEMATOCRIT: 26.3 % — AB (ref 38.4–49.9)
HEMOGLOBIN: 8.2 g/dL — AB (ref 13.0–17.1)
Hemoglobin: 8.7 g/dL — ABNORMAL LOW (ref 13.0–17.0)
LYMPH#: 1.5 10*3/uL (ref 0.9–3.3)
LYMPH%: 15.4 % (ref 14.0–49.0)
Lymphocytes Relative: 14 % (ref 12–46)
Lymphs Abs: 1.6 10*3/uL (ref 0.7–4.0)
MCH: 23.9 pg — ABNORMAL LOW (ref 27.2–33.4)
MCH: 23.4 pg — ABNORMAL LOW (ref 26.0–34.0)
MCHC: 31.2 g/dL — AB (ref 32.0–36.0)
MCHC: 30.5 g/dL (ref 30.0–36.0)
MCV: 76.7 fL — AB (ref 79.3–98.0)
MCV: 76.6 fL — ABNORMAL LOW (ref 78.0–100.0)
MONO#: 1 10*3/uL — ABNORMAL HIGH (ref 0.1–0.9)
MONO%: 10.3 % (ref 0.0–14.0)
Monocytes Absolute: 1 10*3/uL (ref 0.1–1.0)
Monocytes Relative: 9 % (ref 3–12)
NEUT#: 7.4 10*3/uL — ABNORMAL HIGH (ref 1.5–6.5)
NEUT%: 73.9 % (ref 39.0–75.0)
Neutro Abs: 8.7 10*3/uL — ABNORMAL HIGH (ref 1.7–7.7)
Neutrophils Relative %: 77 % (ref 43–77)
Platelets: 282 10*3/uL (ref 140–400)
Platelets: 361 10*3/uL (ref 150–400)
RBC: 3.43 10*6/uL — AB (ref 4.20–5.82)
RBC: 3.72 MIL/uL — ABNORMAL LOW (ref 4.22–5.81)
RDW: 19.3 % — ABNORMAL HIGH (ref 11.0–14.6)
RDW: 19.3 % — ABNORMAL HIGH (ref 11.5–15.5)
WBC: 10 10*3/uL (ref 4.0–10.3)
WBC: 11.3 10*3/uL — ABNORMAL HIGH (ref 4.0–10.5)

## 2014-10-07 LAB — RAPID URINE DRUG SCREEN, HOSP PERFORMED
Amphetamines: NOT DETECTED
Barbiturates: NOT DETECTED
Benzodiazepines: NOT DETECTED
Cocaine: NOT DETECTED
Opiates: POSITIVE — AB
Tetrahydrocannabinol: NOT DETECTED

## 2014-10-07 LAB — COMPREHENSIVE METABOLIC PANEL
ALT: 10 U/L — ABNORMAL LOW (ref 17–63)
AST: 35 U/L (ref 15–41)
Albumin: 2.7 g/dL — ABNORMAL LOW (ref 3.5–5.0)
Alkaline Phosphatase: 1409 U/L — ABNORMAL HIGH (ref 38–126)
Anion gap: 12 (ref 5–15)
BUN: 15 mg/dL (ref 6–20)
CO2: 29 mmol/L (ref 22–32)
Calcium: 9.3 mg/dL (ref 8.9–10.3)
Chloride: 94 mmol/L — ABNORMAL LOW (ref 101–111)
Creatinine, Ser: 0.98 mg/dL (ref 0.61–1.24)
GFR calc Af Amer: >60 mL/min (ref >60)
GFR calc non Af Amer: >60 mL/min (ref >60)
Glucose, Bld: 110 mg/dL — ABNORMAL HIGH (ref 65–99)
Potassium: 4.1 mmol/L (ref 3.5–5.1)
Sodium: 135 mmol/L (ref 135–145)
Total Bilirubin: 1.4 mg/dL — ABNORMAL HIGH (ref 0.3–1.2)
Total Protein: 8.2 g/dL — ABNORMAL HIGH (ref 6.5–8.1)

## 2014-10-07 LAB — URINALYSIS, ROUTINE W REFLEX MICROSCOPIC
Glucose, UA: NEGATIVE mg/dL
Hgb urine dipstick: NEGATIVE
Ketones, ur: NEGATIVE mg/dL
Nitrite: NEGATIVE
Protein, ur: 30 mg/dL — AB
Specific Gravity, Urine: 1.027 (ref 1.005–1.030)
Urobilinogen, UA: 1 mg/dL (ref 0.0–1.0)
pH: 5.5 (ref 5.0–8.0)

## 2014-10-07 LAB — ETHANOL: Alcohol, Ethyl (B): <5 mg/dL (ref <5)

## 2014-10-07 LAB — COMPREHENSIVE METABOLIC PANEL (CC13)
ALBUMIN: 2.3 g/dL — AB (ref 3.5–5.0)
ALT: 7 U/L (ref 0–55)
ANION GAP: 9 meq/L (ref 3–11)
AST: 33 U/L (ref 5–34)
Alkaline Phosphatase: 1492 U/L — ABNORMAL HIGH (ref 40–150)
BILIRUBIN TOTAL: 1.25 mg/dL — AB (ref 0.20–1.20)
BUN: 14.7 mg/dL (ref 7.0–26.0)
CALCIUM: 9.6 mg/dL (ref 8.4–10.4)
CHLORIDE: 97 meq/L — AB (ref 98–109)
CO2: 29 mEq/L (ref 22–29)
CREATININE: 0.9 mg/dL (ref 0.7–1.3)
EGFR: 85 mL/min/{1.73_m2} — AB (ref >90)
GLUCOSE: 98 mg/dL (ref 70–140)
Potassium: 4.1 mEq/L (ref 3.5–5.1)
Sodium: 135 mEq/L — ABNORMAL LOW (ref 136–145)
TOTAL PROTEIN: 7.2 g/dL (ref 6.4–8.3)

## 2014-10-07 LAB — URINE MICROSCOPIC-ADD ON

## 2014-10-07 LAB — PROTIME-INR
INR: 1.67 — AB (ref 0.00–1.49)
Prothrombin Time: 19.7 seconds — ABNORMAL HIGH (ref 11.6–15.2)

## 2014-10-07 LAB — PSA: PSA: 157.2 ng/mL — ABNORMAL HIGH (ref <=4.00)

## 2014-10-07 LAB — I-STAT CG4 LACTIC ACID, ED: Lactic Acid, Venous: 1.25 mmol/L (ref 0.5–2.0)

## 2014-10-07 MED ORDER — VANCOMYCIN HCL IN DEXTROSE 1-5 GM/200ML-% IV SOLN
1000.0000 mg | Freq: Once | INTRAVENOUS | Status: AC
  Administered 2014-10-07: 1000 mg via INTRAVENOUS
  Filled 2014-10-07: qty 200

## 2014-10-07 MED ORDER — PIPERACILLIN-TAZOBACTAM 3.375 G IVPB
3.3750 g | Freq: Three times a day (TID) | INTRAVENOUS | Status: DC
  Administered 2014-10-08 – 2014-10-09 (×6): 3.375 g via INTRAVENOUS
  Filled 2014-10-07 (×9): qty 50

## 2014-10-07 MED ORDER — PIPERACILLIN-TAZOBACTAM 3.375 G IVPB
3.3750 g | Freq: Once | INTRAVENOUS | Status: AC
  Administered 2014-10-07: 3.375 g via INTRAVENOUS
  Filled 2014-10-07: qty 50

## 2014-10-07 MED ORDER — LATANOPROST 0.005 % OP SOLN
1.0000 [drp] | Freq: Every day | OPHTHALMIC | Status: DC
  Administered 2014-10-07 – 2014-10-13 (×7): 1 [drp] via OPHTHALMIC
  Filled 2014-10-07: qty 2.5

## 2014-10-07 MED ORDER — VANCOMYCIN HCL IN DEXTROSE 750-5 MG/150ML-% IV SOLN
750.0000 mg | Freq: Two times a day (BID) | INTRAVENOUS | Status: DC
  Administered 2014-10-08 – 2014-10-09 (×3): 750 mg via INTRAVENOUS
  Filled 2014-10-07 (×4): qty 150

## 2014-10-07 MED ORDER — ONDANSETRON HCL 4 MG PO TABS: 4.0000 mg | ORAL_TABLET | Freq: Four times a day (QID) | ORAL | Status: DC | PRN

## 2014-10-07 MED ORDER — ENOXAPARIN SODIUM 150 MG/ML ~~LOC~~ SOLN: 1.0000 mg/kg | Freq: Two times a day (BID) | SUBCUTANEOUS | Status: DC

## 2014-10-07 MED ORDER — ALUM & MAG HYDROXIDE-SIMETH 200-200-20 MG/5ML PO SUSP: 30.0000 mL | Freq: Four times a day (QID) | ORAL | Status: DC | PRN

## 2014-10-07 MED ORDER — MORPHINE SULFATE 15 MG PO TABS
15.0000 mg | ORAL_TABLET | ORAL | Status: DC | PRN
  Administered 2014-10-07 – 2014-10-09 (×5): 15 mg via ORAL
  Filled 2014-10-07 (×5): qty 1

## 2014-10-07 MED ORDER — ONDANSETRON HCL 4 MG/2ML IJ SOLN
4.0000 mg | Freq: Four times a day (QID) | INTRAMUSCULAR | Status: DC | PRN
  Administered 2014-10-10 (×2): 4 mg via INTRAVENOUS
  Filled 2014-10-07 (×2): qty 2

## 2014-10-07 MED ORDER — CALCIUM CARBONATE-VITAMIN D 500-200 MG-UNIT PO TABS
2.0000 | ORAL_TABLET | Freq: Two times a day (BID) | ORAL | Status: DC
  Administered 2014-10-08 – 2014-10-14 (×13): 2 via ORAL
  Filled 2014-10-07 (×14): qty 2

## 2014-10-07 MED ORDER — OXYCODONE HCL 5 MG PO TABS
5.0000 mg | ORAL_TABLET | ORAL | Status: DC | PRN
  Administered 2014-10-08: 5 mg via ORAL
  Filled 2014-10-07 (×2): qty 1

## 2014-10-07 MED ORDER — CHLORHEXIDINE GLUCONATE 0.12 % MT SOLN
15.0000 mL | Freq: Two times a day (BID) | OROMUCOSAL | Status: DC
  Administered 2014-10-07 – 2014-10-14 (×10): 15 mL via OROMUCOSAL
  Filled 2014-10-07 (×16): qty 15

## 2014-10-07 MED ORDER — ACETAMINOPHEN 650 MG RE SUPP: 650.0000 mg | Freq: Four times a day (QID) | RECTAL | Status: DC | PRN

## 2014-10-07 MED ORDER — SODIUM CHLORIDE 0.9 % IV BOLUS (SEPSIS)
500.0000 mL | Freq: Once | INTRAVENOUS | Status: AC
  Administered 2014-10-07: 500 mL via INTRAVENOUS

## 2014-10-07 MED ORDER — SODIUM CHLORIDE 0.9 % IV SOLN
INTRAVENOUS | Status: DC
  Administered 2014-10-07: 23:00:00 via INTRAVENOUS

## 2014-10-07 MED ORDER — ENOXAPARIN SODIUM 40 MG/0.4ML ~~LOC~~ SOLN
40.0000 mg | Freq: Every day | SUBCUTANEOUS | Status: DC
  Administered 2014-10-07 – 2014-10-13 (×7): 40 mg via SUBCUTANEOUS
  Filled 2014-10-07 (×8): qty 0.4

## 2014-10-07 MED ORDER — CETYLPYRIDINIUM CHLORIDE 0.05 % MT LIQD
7.0000 mL | Freq: Two times a day (BID) | OROMUCOSAL | Status: DC
  Administered 2014-10-08 – 2014-10-14 (×11): 7 mL via OROMUCOSAL

## 2014-10-07 MED ORDER — WARFARIN SODIUM 5 MG PO TABS: 5.0000 mg | ORAL_TABLET | Freq: Every day | ORAL | Status: DC

## 2014-10-07 MED ORDER — TRAVOPROST (BAK FREE) 0.004 % OP SOLN
1.0000 [drp] | Freq: Every day | OPHTHALMIC | Status: DC
  Filled 2014-10-07: qty 2.5

## 2014-10-07 MED ORDER — ACETAMINOPHEN 325 MG PO TABS: 650.0000 mg | ORAL_TABLET | Freq: Four times a day (QID) | ORAL | Status: DC | PRN

## 2014-10-07 NOTE — Telephone Encounter (Signed)
Spoke with daughter Gabriela Eves and was informed that pt is weak and has hallucinations today.  Pt also has severe pain in back, legs, and feet.  Pt has not been feeling well today and cancelled appt with Dr. Alen Blew as scheduled.  Per Amy, pt has been confused some but hallucinations happened when the frequency of Morphine 15 mg was increased to every 4 hours as needed.  Pt also has very minimal po intake - both eating and drinking.  Pt's urine is very concentrated and has foul odor.  Amy stated the concentrated urine has been going on for a few months.  Pt has swithched back to Oxycodone today - 1 tablet every 4 hours - since pt thinks Morphine causing hallucinations.  Amy also stated pt's leg buckled and almost not moving well for a few weeks now. Instructed Amy to bring pt in at 230 pm to see Jenny Reichmann, NP symptom management for further evaluations.  POF sent to scheduler.  Amy stated she would call pt and informed pt of appt. Gabriela Eves'   Phone     [phone].

## 2014-10-07 NOTE — ED Notes (Signed)
Urinal at bedside.  

## 2014-10-07 NOTE — Telephone Encounter (Signed)
See today's phone note by Elray Buba RN, Triage

## 2014-10-07 NOTE — ED Notes (Signed)
Pt with Hx of prostate cancer with bone mets c/o hallucinations x 2 days, pt believes this related to morphine. Pt after took 4 chemo pills today at 1130 because he missed his dose yesterday. Pt states he has been taking Extandi oral chemo medications x 6 months. Pt states he sees things on the floor which are not there such as a glass of water.

## 2014-10-07 NOTE — Progress Notes (Signed)
ANTIBIOTIC CONSULT NOTE - INITIAL  Pharmacy Consult for vancomycin and zosyn Indication: sepsis  No Known Allergies  Patient Measurements: Height: 6' (182.9 cm) Weight: 227 lb 1.2 oz (103 kg) IBW/kg (Calculated) : 77.6   Vital Signs: Temp: 98.3 F (36.8 C) (07/20 2146) Temp Source: Oral (07/20 2146) BP: 110/56 mmHg (07/20 2146) Pulse Rate: 113 (07/20 2146) Intake/Output from previous day:   Intake/Output from this shift:    Labs:  Recent Labs  10/07/14 1546  WBC 11.3*  HGB 8.7*  PLT 361  CREATININE 0.98   Estimated Creatinine Clearance: 83.4 mL/min (by C-G formula based on Cr of 0.98). No results for input(s): VANCOTROUGH, VANCOPEAK, VANCORANDOM, GENTTROUGH, GENTPEAK, GENTRANDOM, TOBRATROUGH, TOBRAPEAK, TOBRARND, AMIKACINPEAK, AMIKACINTROU, AMIKACIN in the last 72 hours.   Microbiology: No results found for this or any previous visit (from the past 720 hour(s)).  Medical History: Past Medical History  Diagnosis Date  . Ruptured thoracic aortic aneurysm   . Hyperlipidemia   . GERD (gastroesophageal reflux disease)   . Diverticulosis   . Internal hemorrhoids   . Colon polyps   . HTN (hypertension)   . Cancer     Prostate with bone metastisis     Assessment: H&P not complete- 74 y.o. Male presents to the emergency department with hallucinations, weakness and fever.  Pharmacy consulted to dose vanc and zosyn for sepsis.  Pt received vanc 1 gm x 1 in ED.  Goal of Therapy:  Vancomycin trough level 15-20 mcg/ml vanc and zosyn per renal function  Plan:  Zosyn 3.375mg  IV q8h extended interval Vancomycin 750mg  IV q12h Follow up cultures, renal function, clinical course vanc trough as indicated    Dolly Rias RPh 10/07/2014, 9:53 PM Pager [phone]

## 2014-10-07 NOTE — Telephone Encounter (Signed)
Pt's daughter Amy called to cancel today's ov, states pt isn't feeling well and it's coming from the Cancer. I didn't see anything available until first of August she didn't want pt waiting this long I forward the call to triage to see if they could get him in sooner..... KJ

## 2014-10-07 NOTE — ED Provider Notes (Signed)
Medical screening examination/treatment/procedure(s) were conducted as a shared visit with non-physician practitioner(s) and myself.  I personally evaluated the patient during the encounter.   EKG Interpretation None     Patient here with hallucinations 2 days. Recent medication adjustment. He is on oral chemotherapy. Low-grade temperature here noted. Blood cultures obtained. Likely within normal limits. Was started on antibiotics and admitted for observation  Lacretia Leigh, MD 10/07/14 [phone]

## 2014-10-07 NOTE — Progress Notes (Signed)
PHARMACIST - PHYSICIAN COMMUNICATION DR:  Arnoldo Morale CONCERNING: Pharmacy Care Issues Regarding Warfarin Labs  RECOMMENDATION (Action Taken): A baseline and daily protime for three days has been ordered to meet the [facility] Patient safety goal and comply with the current [place].   The Pharmacy will defer all warfarin dose order changes and follow up of lab results to the prescriber unless an additional order to initiate a "pharmacy Coumadin consult" is placed.  DESCRIPTION:  While hospitalized, to be in compliance with [facility] Patient Safety Goals, all patients on warfarin must have a baseline and/or current protime prior to the administration of warfarin. Pharmacy has received your order for warfarin without these required laboratory assessments.

## 2014-10-07 NOTE — Telephone Encounter (Signed)
Pt taken to Ed via wheelchair per Dr. Alen Blew. Information on provided to registration.

## 2014-10-07 NOTE — ED Provider Notes (Signed)
CSN: [id number]     Arrival date & time 10/07/14  1511 History   First MD Initiated Contact with Patient 10/07/14 1529     Chief Complaint  Patient presents with  . Hallucinations     (Consider location/radiation/quality/duration/timing/severity/associated sxs/prior Treatment) HPI Patient presents to the emergency department with hallucinations, weakness and fever.  The patient states that he feels like the hallucinations could be related to a recent change in his morphine.  Patient is also had some weakness and fever.  Patient was sent over by the cancer center for further evaluation.  The patient denies chest pain, cough, runny nose, sore throat, back pain, neck pain, headache, blurred vision, abdominal pain, or syncope.  The patient eats.  Nothing seems make the condition better or worse.  He did noticed the morphine increasing seemed to affect him Past Medical History  Diagnosis Date  . Ruptured thoracic aortic aneurysm   . Hyperlipidemia   . GERD (gastroesophageal reflux disease)   . Diverticulosis   . Internal hemorrhoids   . Colon polyps   . HTN (hypertension)   . Cancer     Prostate with bone metastisis   Past Surgical History  Procedure Laterality Date  . Appendectomy    . Cardiac valve replacement    . Thoracic aortic aneurysm repair    . Inguinal hernia repair      left   Family History  Problem Relation Age of Onset  . Colon cancer Mother    History  Substance Use Topics  . Smoking status: Former Smoker    Types: Cigarettes  . Smokeless tobacco: Former Systems developer    Quit date: 01/19/2011  . Alcohol Use: No    Review of Systems  All other systems negative except as documented in the HPI. All pertinent positives and negatives as reviewed in the HPI.  Allergies  Review of patient's allergies indicates no known allergies.  Home Medications   Prior to Admission medications   Medication Sig Start Date End Date Taking? Authorizing Provider  brimonidine (ALPHAGAN  P) 0.1 % SOLN Place 1 drop into both eyes at bedtime.   Yes Historical Provider, MD  Calcium Citrate-Vitamin D (CALCIUM + D PO) Take 1,200 Units by mouth daily.   Yes Historical Provider, MD  enzalutamide Gillermina Phy) 40 MG capsule Take 160 mg by mouth daily.   Yes Historical Provider, MD  morphine (MSIR) 15 MG tablet Take 15 mg by mouth every 4 (four) hours as needed for moderate pain or severe pain.   Yes Historical Provider, MD  oxycodone (OXY-IR) 5 MG capsule Take 5 mg by mouth every 4 (four) hours as needed for pain (breakthrough pain.).   Yes Historical Provider, MD  travoprost, benzalkonium, (TRAVATAN) 0.004 % ophthalmic solution Place 1 drop into both eyes at bedtime.   Yes Historical Provider, MD  triamcinolone cream (KENALOG) 0.1 % Apply 1 application topically daily as needed (rash).   Yes Historical Provider, MD  metoprolol (LOPRESSOR) 50 MG tablet Take 50 mg by mouth daily.    Historical Provider, MD  metoprolol succinate (TOPROL-XL) 50 MG 24 hr tablet  05/22/11   Historical Provider, MD  warfarin (COUMADIN) 5 MG tablet Take 5 mg by mouth daily.    Historical Provider, MD   BP 115/54 mmHg  Pulse 106  Temp(Src) 99.1 F (37.3 C) (Oral)  Resp 19  SpO2 97% Physical Exam  Constitutional: He is oriented to person, place, and time. He appears well-developed and well-nourished. No distress.  HENT:  Head: Normocephalic and atraumatic.  Mouth/Throat: Oropharynx is clear and moist.  Eyes: Pupils are equal, round, and reactive to light.  Neck: Normal range of motion. Neck supple.  Cardiovascular: Normal rate, regular rhythm and normal heart sounds.  Exam reveals no gallop and no friction rub.   No murmur heard. Pulmonary/Chest: Effort normal and breath sounds normal. No respiratory distress.  Neurological: He is alert and oriented to person, place, and time. He exhibits normal muscle tone. Coordination normal.  Skin: Skin is warm and dry. No rash noted. No erythema.  Psychiatric: He has a  normal mood and affect. His behavior is normal.  Nursing note and vitals reviewed.   ED Course  Procedures (including critical care time) Labs Review Labs Reviewed  URINALYSIS, ROUTINE W REFLEX MICROSCOPIC (NOT AT [facility]) - Abnormal; Notable for the following:    Color, Urine ORANGE (*)    APPearance CLOUDY (*)    Bilirubin Urine MODERATE (*)    Protein, ur 30 (*)    Leukocytes, UA MODERATE (*)    All other components within normal limits  COMPREHENSIVE METABOLIC PANEL - Abnormal; Notable for the following:    Chloride 94 (*)    Glucose, Bld 110 (*)    Total Protein 8.2 (*)    Albumin 2.7 (*)    ALT 10 (*)    Alkaline Phosphatase 1409 (*)    Total Bilirubin 1.4 (*)    All other components within normal limits  CBC WITH DIFFERENTIAL/PLATELET - Abnormal; Notable for the following:    WBC 11.3 (*)    RBC 3.72 (*)    Hemoglobin 8.7 (*)    HCT 28.5 (*)    MCV 76.6 (*)    MCH 23.4 (*)    RDW 19.3 (*)    Neutro Abs 8.7 (*)    All other components within normal limits  URINE RAPID DRUG SCREEN, HOSP PERFORMED - Abnormal; Notable for the following:    Opiates POSITIVE (*)    All other components within normal limits  URINE MICROSCOPIC-ADD ON - Abnormal; Notable for the following:    Bacteria, UA FEW (*)    Crystals HIPPURIC ACID CRYSTALS (*)    All other components within normal limits  CULTURE, BLOOD (ROUTINE X 2)  CULTURE, BLOOD (ROUTINE X 2)  ETHANOL  I-STAT CG4 LACTIC ACID, ED    Imaging Review Dg Chest 2 View  10/07/2014   CLINICAL DATA:  Mental status change, hallucinations, prostate malignancy with widespread bony metastases.  EXAM: CHEST  2 VIEW  COMPARISON:  None available in PACs  FINDINGS: The lungs are adequately inflated. There is no focal infiltrate. There is no pleural effusion. The cardiac silhouette is top-normal in size. The pulmonary vascularity is normal. The mediastinum is normal in width. There is diffuse skeletal sclerosis consistent with bony metastatic  disease.  IMPRESSION: There is no acute cardiopulmonary abnormality. There are widespread skeletal metastases.   Electronically Signed   By: David  Martinique M.D.   On: 10/07/2014 17:03   Ct Head Wo Contrast  10/07/2014   CLINICAL DATA:  History of prostate carcinoma with metastatic bone disease and hallucinations for 2 days, possible medication related  EXAM: CT HEAD WITHOUT CONTRAST  TECHNIQUE: Contiguous axial images were obtained from the base of the skull through the vertex without intravenous contrast.  COMPARISON:  None.  FINDINGS: Bony calvarium is intact. No gross soft tissue abnormality is noted. Slight asymmetrical dilatation of the right lateral ventricle is noted. No focal mass lesion  or acute hemorrhage is noted. No findings to suggest acute infarct are seen.  IMPRESSION: No acute abnormality noted.   Electronically Signed   By: Inez Catalina M.D.   On: 10/07/2014 17:48    Patient be admitted to the hospital for further evaluation and care.  Told of the plan and all questions were answered.  MDM   Final diagnoses:  SOB (shortness of breath)  Hallucinations        Dalia Heading, PA-C 10/08/14 0139  Lacretia Leigh, MD 10/13/14 1019

## 2014-10-07 NOTE — Telephone Encounter (Signed)
Daughter Amy Jimmye [facility] ) called and left message requesting a call back from nurse.  Attempted to call Amy back unsuccessfully.  Left message on voice mail requesting a call back to nurse. Amy's  Phone    [phone].

## 2014-10-07 NOTE — H&P (Signed)
[facility] Admission History and Physical       [name] LFY:[id number] DOB: May 30, 1940 DOA: 10/07/2014  Referring physician: EDP PCP: Woody Seller, MD  Specialists: Oncology: Dr Osker Mason  Chief Complaint: Hallucinations  HPI: Clarence Pollard is a 74 y.o. male with a history of Metastatic Prostate Cancer on Oral Chemotherapy of Xtandi, who presents to the ED with complaints of confusion and hallucinations today.    He was to be [facility] at the [facility] for an appointment today but did not feel well due to increased weakness and poor appetite, and he was advised to go to the ED.  He was found to have a low grade fever in the ED to 100.1 and blood cultures were sent .  A CT scan of the Head was also performed and was negative for acute findings.   He was empirically placed on IV Vancomycin and Zosyn and referred for admission.     Review of Systems:  Constitutional: No Weight Loss, No Weight Gain, Night Sweats, Fevers, Chills, Dizziness, Light Headedness, Fatigue, or Generalized Weakness HEENT: No Headaches, Difficulty Swallowing,Tooth/Dental Problems,Sore Throat,  No Sneezing, Rhinitis, Ear Ache, Nasal Congestion, or Post Nasal Drip,  Cardio-vascular:  No Chest pain, Orthopnea, PND, Edema in Lower Extremities, Anasarca, Dizziness, Palpitations  Resp: No Dyspnea, No DOE, No Productive Cough, No Non-Productive Cough, No Hemoptysis, No Wheezing.    GI: No Heartburn, Indigestion, Abdominal Pain, Nausea, Vomiting, Diarrhea, Constipation, Hematemesis, Hematochezia, Melena, Change in Bowel Habits,  Loss of Appetite  GU: No Dysuria, No Change in Color of Urine, No Urgency or Urinary Frequency, No Flank pain.  Musculoskeletal: No Joint Pain or Swelling, No Decreased Range of Motion, No Back Pain.  Neurologic: No Syncope, No Seizures, Muscle Weakness, Paresthesia, Vision Disturbance or Loss, No Diplopia, No Vertigo, No Difficulty Walking,  Skin: No Rash or Lesions. Psych: No Change  in Mood or Affect, No Depression or Anxiety, No Memory loss, No Confusion, or Hallucinations   Past Medical History  Diagnosis Date  . Ruptured thoracic aortic aneurysm   . Hyperlipidemia   . GERD (gastroesophageal reflux disease)   . Diverticulosis   . Internal hemorrhoids   . Colon polyps   . HTN (hypertension)   . Cancer     Prostate with bone metastisis     Past Surgical History  Procedure Laterality Date  . Appendectomy    . Cardiac valve replacement    . Thoracic aortic aneurysm repair    . Inguinal hernia repair      left      Prior to Admission medications   Medication Sig Start Date End Date Taking? Authorizing Provider  brimonidine (ALPHAGAN P) 0.1 % SOLN Place 1 drop into both eyes at bedtime.   Yes Historical Provider, MD  Calcium Citrate-Vitamin D (CALCIUM + D PO) Take 1,200 Units by mouth daily.   Yes Historical Provider, MD  enzalutamide Gillermina Phy) 40 MG capsule Take 160 mg by mouth daily.   Yes Historical Provider, MD  morphine (MSIR) 15 MG tablet Take 15 mg by mouth every 4 (four) hours as needed for moderate pain or severe pain.   Yes Historical Provider, MD  oxycodone (OXY-IR) 5 MG capsule Take 5 mg by mouth every 4 (four) hours as needed for pain (breakthrough pain.).   Yes Historical Provider, MD  travoprost, benzalkonium, (TRAVATAN) 0.004 % ophthalmic solution Place 1 drop into both eyes at bedtime.   Yes Historical Provider, MD  triamcinolone cream (KENALOG) 0.1 % Apply 1 application  topically daily as needed (rash).   Yes Historical Provider, MD  metoprolol (LOPRESSOR) 50 MG tablet Take 50 mg by mouth daily.    Historical Provider, MD  metoprolol succinate (TOPROL-XL) 50 MG 24 hr tablet  05/22/11   Historical Provider, MD  warfarin (COUMADIN) 5 MG tablet Take 5 mg by mouth daily.    Historical Provider, MD     No Known Allergies  Social History:  reports that he has quit smoking. His smoking use included Cigarettes. He quit smokeless tobacco use about 3  years ago. He reports that he does not drink alcohol or use illicit drugs.    Family History  Problem Relation Age of Onset  . Colon cancer Mother        Physical Exam:  GEN:  Pleasant Obese Elderly  74 y.o.  Caucasian male examined and in no acute distress; cooperative with exam Filed Vitals:   10/07/14 2000 10/07/14 2030 10/07/14 2100 10/07/14 2110  BP: 98/54 108/52 119/60   Pulse: 107 105 107   Temp:    98.8 F (37.1 C)  TempSrc:    Oral  Resp: 21 18 20    SpO2: 92% 96% 92%    Blood pressure 119/60, pulse 107, temperature 98.8 F (37.1 C), temperature source Oral, resp. rate 20, SpO2 92 %. PSYCH: He is alert and oriented x4; does not appear anxious does not appear depressed; affect is normal HEENT: Normocephalic and Atraumatic, Mucous membranes pink; PERRLA; EOM intact; Fundi:  Benign;  No scleral icterus, Nares: Patent, Oropharynx: Clear, Fair Dentition,    Neck:  FROM, No Cervical Lymphadenopathy nor Thyromegaly or Carotid Bruit; No JVD; Breasts:: Not examined CHEST WALL: No tenderness CHEST: Normal respiration, clear to auscultation bilaterally HEART: Regular rate and rhythm; no murmurs rubs or gallops BACK: No kyphosis or scoliosis; No CVA tenderness ABDOMEN: Positive Bowel Sounds, Obese, Soft Non-Tender, No Rebound or Guarding; No Masses, No Organomegaly, No Pannus; No Intertriginous candida. Rectal Exam: Not done EXTREMITIES: No Cyanosis, Clubbing, or Edema; No Ulcerations. Genitalia: not examined PULSES: 2+ and symmetric SKIN: Normal hydration no rash or ulceration CNS:  Alert and Oriented x 4, No Focal Deficits Vascular: pulses palpable throughout    Labs on Admission:  Basic Metabolic Panel:  Recent Labs Lab 10/07/14 1546  NA 135  K 4.1  CL 94*  CO2 29  GLUCOSE 110*  BUN 15  CREATININE 0.98  CALCIUM 9.3   Liver Function Tests:  Recent Labs Lab 10/07/14 1546  AST 35  ALT 10*  ALKPHOS 1409*  BILITOT 1.4*  PROT 8.2*  ALBUMIN 2.7*   No  results for input(s): LIPASE, AMYLASE in the last 168 hours. No results for input(s): AMMONIA in the last 168 hours. CBC:  Recent Labs Lab 10/07/14 1546  WBC 11.3*  NEUTROABS 8.7*  HGB 8.7*  HCT 28.5*  MCV 76.6*  PLT 361   Cardiac Enzymes: No results for input(s): CKTOTAL, CKMB, CKMBINDEX, TROPONINI in the last 168 hours.  BNP (last 3 results) No results for input(s): BNP in the last 8760 hours.  ProBNP (last 3 results) No results for input(s): PROBNP in the last 8760 hours.  CBG: No results for input(s): GLUCAP in the last 168 hours.  Radiological Exams on Admission: Dg Chest 2 View  10/07/2014   CLINICAL DATA:  Mental status change, hallucinations, prostate malignancy with widespread bony metastases.  EXAM: CHEST  2 VIEW  COMPARISON:  None available in PACs  FINDINGS: The lungs are adequately inflated. There is no  focal infiltrate. There is no pleural effusion. The cardiac silhouette is top-normal in size. The pulmonary vascularity is normal. The mediastinum is normal in width. There is diffuse skeletal sclerosis consistent with bony metastatic disease.  IMPRESSION: There is no acute cardiopulmonary abnormality. There are widespread skeletal metastases.   Electronically Signed   By: David  Martinique M.D.   On: 10/07/2014 17:03   Ct Head Wo Contrast  10/07/2014   CLINICAL DATA:  History of prostate carcinoma with metastatic bone disease and hallucinations for 2 days, possible medication related  EXAM: CT HEAD WITHOUT CONTRAST  TECHNIQUE: Contiguous axial images were obtained from the base of the skull through the vertex without intravenous contrast.  COMPARISON:  None.  FINDINGS: Bony calvarium is intact. No gross soft tissue abnormality is noted. Slight asymmetrical dilatation of the right lateral ventricle is noted. No focal mass lesion or acute hemorrhage is noted. No findings to suggest acute infarct are seen.  IMPRESSION: No acute abnormality noted.   Electronically Signed   By:  Inez Catalina M.D.   On: 10/07/2014 17:48       Assessment/Plan:     74 y.o. male with  Principal Problem:    1.    Acute encephalopathy/Hallucinations    Active Problems:    2.    Fever   Blood Cultures sent   Empiric IV Vancomycin and Zosuyn       3.    Weakness generalized   Gentle IVFs      4.    HTN (hypertension)   Monitor BPs    No Longer on Toprol or Metoprolol Rx      5.    Prostate cancer metastatic to multiple sites   On Oral Chemo Rx of Xtandi 40 mg x 4 tabs PO q day   Notify Oncology in AM      6.    DVT Prophylaxis   Lovenox     Code Status:     FULL CODE        Family Communication:   Wife at Bedside      Disposition Plan:    Inpatient Status        Time spent:  [facility] Pager [phone]   If [place] Please Contact the Day Rounding Team MD for [facility]  If 7PM-7AM, Please Contact Night-Floor Coverage  [email or web address] Password [email or web address] 10/07/2014, 9:20 PM     ADDENDUM:   Patient was seen and examined on 10/07/2014

## 2014-10-07 NOTE — Telephone Encounter (Signed)
He needs to be sent to the ED for evaluation.

## 2014-10-08 ENCOUNTER — Observation Stay (HOSPITAL_COMMUNITY): Payer: Medicare Other

## 2014-10-08 ENCOUNTER — Encounter: Payer: Self-pay | Admitting: Oncology

## 2014-10-08 DIAGNOSIS — Z87891 Personal history of nicotine dependence: Secondary | ICD-10-CM | POA: Diagnosis not present

## 2014-10-08 DIAGNOSIS — C61 Malignant neoplasm of prostate: Secondary | ICD-10-CM

## 2014-10-08 DIAGNOSIS — R531 Weakness: Secondary | ICD-10-CM | POA: Diagnosis present

## 2014-10-08 DIAGNOSIS — Z79891 Long term (current) use of opiate analgesic: Secondary | ICD-10-CM | POA: Diagnosis not present

## 2014-10-08 DIAGNOSIS — E440 Moderate protein-calorie malnutrition: Secondary | ICD-10-CM | POA: Diagnosis present

## 2014-10-08 DIAGNOSIS — R509 Fever, unspecified: Secondary | ICD-10-CM | POA: Diagnosis present

## 2014-10-08 DIAGNOSIS — I10 Essential (primary) hypertension: Secondary | ICD-10-CM

## 2014-10-08 DIAGNOSIS — Z6830 Body mass index (BMI) 30.0-30.9, adult: Secondary | ICD-10-CM | POA: Diagnosis not present

## 2014-10-08 DIAGNOSIS — C7951 Secondary malignant neoplasm of bone: Secondary | ICD-10-CM | POA: Diagnosis present

## 2014-10-08 DIAGNOSIS — T402X5A Adverse effect of other opioids, initial encounter: Secondary | ICD-10-CM | POA: Diagnosis present

## 2014-10-08 DIAGNOSIS — R748 Abnormal levels of other serum enzymes: Secondary | ICD-10-CM | POA: Diagnosis present

## 2014-10-08 DIAGNOSIS — K5900 Constipation, unspecified: Secondary | ICD-10-CM | POA: Diagnosis present

## 2014-10-08 DIAGNOSIS — K5660 Unspecified intestinal obstruction: Secondary | ICD-10-CM | POA: Diagnosis present

## 2014-10-08 DIAGNOSIS — Z800 Family history of malignant neoplasm of digestive organs: Secondary | ICD-10-CM | POA: Diagnosis not present

## 2014-10-08 DIAGNOSIS — G893 Neoplasm related pain (acute) (chronic): Secondary | ICD-10-CM | POA: Diagnosis present

## 2014-10-08 DIAGNOSIS — R443 Hallucinations, unspecified: Secondary | ICD-10-CM | POA: Diagnosis present

## 2014-10-08 DIAGNOSIS — G92 Toxic encephalopathy: Secondary | ICD-10-CM | POA: Diagnosis present

## 2014-10-08 DIAGNOSIS — Z7901 Long term (current) use of anticoagulants: Secondary | ICD-10-CM | POA: Diagnosis not present

## 2014-10-08 DIAGNOSIS — K219 Gastro-esophageal reflux disease without esophagitis: Secondary | ICD-10-CM | POA: Diagnosis present

## 2014-10-08 DIAGNOSIS — Z79899 Other long term (current) drug therapy: Secondary | ICD-10-CM | POA: Diagnosis not present

## 2014-10-08 DIAGNOSIS — Z515 Encounter for palliative care: Secondary | ICD-10-CM | POA: Diagnosis not present

## 2014-10-08 LAB — BASIC METABOLIC PANEL
Anion gap: 9 (ref 5–15)
BUN: 15 mg/dL (ref 6–20)
CALCIUM: 8 mg/dL — AB (ref 8.9–10.3)
CO2: 25 mmol/L (ref 22–32)
Chloride: 98 mmol/L — ABNORMAL LOW (ref 101–111)
Creatinine, Ser: 0.98 mg/dL (ref 0.61–1.24)
GFR calc non Af Amer: >60 mL/min (ref >60)
GLUCOSE: 110 mg/dL — AB (ref 65–99)
POTASSIUM: 3.3 mmol/L — AB (ref 3.5–5.1)
Sodium: 132 mmol/L — ABNORMAL LOW (ref 135–145)

## 2014-10-08 LAB — HEMOGLOBIN AND HEMATOCRIT, BLOOD
HCT: 24.2 % — ABNORMAL LOW (ref 39.0–52.0)
Hemoglobin: 7.5 g/dL — ABNORMAL LOW (ref 13.0–17.0)

## 2014-10-08 LAB — CBC
HCT: 25.2 % — ABNORMAL LOW (ref 39.0–52.0)
HEMOGLOBIN: 7.7 g/dL — AB (ref 13.0–17.0)
MCH: 23.1 pg — ABNORMAL LOW (ref 26.0–34.0)
MCHC: 30.6 g/dL (ref 30.0–36.0)
MCV: 75.4 fL — AB (ref 78.0–100.0)
Platelets: 273 10*3/uL (ref 150–400)
RBC: 3.34 MIL/uL — ABNORMAL LOW (ref 4.22–5.81)
RDW: 19 % — ABNORMAL HIGH (ref 11.5–15.5)
WBC: 9.4 10*3/uL (ref 4.0–10.5)

## 2014-10-08 LAB — PROTIME-INR
INR: 1.71 — ABNORMAL HIGH (ref 0.00–1.49)
Prothrombin Time: 20.1 seconds — ABNORMAL HIGH (ref 11.6–15.2)

## 2014-10-08 MED ORDER — ENSURE ENLIVE PO LIQD
237.0000 mL | Freq: Two times a day (BID) | ORAL | Status: DC
  Administered 2014-10-08 – 2014-10-14 (×6): 237 mL via ORAL

## 2014-10-08 NOTE — Clinical Social Work Note (Signed)
Clinical Social Work Assessment  Patient Details  Name: Clarence Pollard MRN: [id number] Date of Birth: October 08, 1940  Date of referral:  10/08/14               Reason for consult:  Facility Placement                Permission sought to share information with:  Family Supports Permission granted to share information::  Yes, Verbal Permission Granted  Name::     Amy  Agency::     Relationship::  Dtr  Contact Information:     Housing/Transportation Living arrangements for the past 2 months:  [place] of Information:  Patient Patient Interpreter Needed:  None Criminal Activity/Legal Involvement Pertinent to Current Situation/Hospitalization:  No - Comment as needed Significant Relationships:  Adult Children Lives with:  Facility Resident Do you feel safe going back to the place where you live?  Yes Need for family participation in patient care:  Yes (Comment)  Care giving concerns:  Patient plans to return back to ALF.   Social Worker assessment / plan:  CSW received referral due to patient being admitted from [facility]. CSW reviewed chart and met with patient at bedside.  Patient reports he moved into ALF about 6 months ago but enjoys where he lives. Patient reports dtr is supportive and assists with transportation to cancer center appointments. Patient reports he plans to return to ALF at [place] spoke with Lannette Donath at [facility] who reports patient can return at DC. Patient was using a walker to ambulate and recently increased care to assist with bathing and dressing. Patient manages his own medications.  CSW completed FL2 and will continue to follow.  Employment status:  Retired Nurse, adult PT Recommendations:  Not assessed at this time Information / Referral to community resources:  Other (Comment Required) (Will return to ALF)  Patient/Family's Response to care:  Patient has flat affect but engaged in  assessment.  Patient/Family's Understanding of and Emotional Response to Diagnosis, Current Treatment, and Prognosis:  Patient reports he has required more assistance lately and was scared about his health yesterday. Patient reports he is feeling better today and is hopeful that he can DC soon. Patient reports he knows he will have more problems in the future related to cancer diagnosis but is grateful that his family is helpful.   Emotional Assessment Appearance:  Appears stated age Attitude/Demeanor/Rapport:  Other (Cooperative) Affect (typically observed):  Appropriate Orientation:  Oriented to Self, Oriented to Place, Oriented to  Time, Oriented to Situation Alcohol / Substance use:  Not Applicable Psych involvement (Current and /or in the community):  No (Comment)  Discharge Needs  Concerns to be addressed:  No discharge needs identified Readmission within the last 30 days:  No Current discharge risk:  None Barriers to Discharge:  Continued Medical Work up   [employer], [place] 10/08/2014, 4:03 PM [phone]

## 2014-10-08 NOTE — Progress Notes (Signed)
Initial Nutrition Assessment  DOCUMENTATION CODES:   Non-severe (moderate) malnutrition in context of chronic illness, Obesity unspecified  INTERVENTION:  - Will order Ensure Enlive po BID, each supplement provides 350 kcal and 20 grams of protein - RD will continue to monitor for needs  NUTRITION DIAGNOSIS:   Increased nutrient needs related to catabolic illness, cancer and cancer related treatments as evidenced by estimated needs.  GOAL:   Patient will meet greater than or equal to 90% of their needs  MONITOR:   PO intake, Supplement acceptance, Weight trends, Labs  REASON FOR ASSESSMENT:   Malnutrition Screening Tool  ASSESSMENT:  74 y.o. male with a history of Metastatic Prostate Cancer on Oral Chemotherapy of Xtandi, who presents to the ED with complaints of confusion and hallucinations today. He was to be seen at the [facility] for an appointment today but did not feel well due to increased weakness and poor appetite, and he was advised to go to the ED. He was found to have a low grade fever in the ED to 100.1 and blood cultures were sent . A CT scan of the Head was also performed and was negative for acute findings.  Pt seen for MST. BMI indicates obesity. Pt ate 3/4 piece of sausage, a few bites of egg, and 1/2 of a tomato for late breakfast today. No other intakes documented since admission. Pt reports decreased appetite over the past few months although he is unable to pinpoint when this started. He states that appetite was better some days that others but that he has not been experiencing any abdominal pain or nausea.  PTA he was drinking Ensure and is interested in receiving it during admission. He was also drinking V8 juice at the senior center he attended.   He indicates that he has lost ~25 lbs in the past 6 months. This would indicate 10% weight loss in 6 months which is significant for time frame. Moderate muscle wasting noted to clavicle area and mild muscle  wasting to lower legs.   Not meeting needs. Medications reviewed. Labs reviewed; Na: 132 mmol/L, K: 3.3 mmol/L, Cl: 98 mmol/L, Ca: 8.   Diet Order:  Diet Heart Room service appropriate?: Yes; Fluid consistency:: Thin  Skin:  Wound (see comment) (MSAD to bilateral groins )  Last BM:  7/20  Height:   Ht Readings from Last 1 Encounters:  10/07/14 6' (1.829 m)    Weight:   Wt Readings from Last 1 Encounters:  10/07/14 227 lb 1.2 oz (103 kg)    Ideal Body Weight:  80.91 kg (kg)  Wt Readings from Last 10 Encounters:  10/07/14 227 lb 1.2 oz (103 kg)  06/20/11 198 lb (89.812 kg)  05/17/11 198 lb 2 oz (89.869 kg)    BMI:  Body mass index is 30.79 kg/(m^2).  Estimated Nutritional Needs:   Kcal:  2400-2600  Protein:  115-125 grams  Fluid:  2.5 L/day  EDUCATION NEEDS:   No education needs identified at this time     Jarome Matin, RD, LDN Inpatient Clinical Dietitian Pager # [phone] After hours/weekend pager # [phone]

## 2014-10-08 NOTE — Progress Notes (Signed)
[facility] PROGRESS NOTE  Clarence Pollard FHL:[id number] DOB: August 31, 1940 DOA: 10/07/2014 PCP: Woody Seller, MD  Assessment/Plan: 1. Acute encephalopathy/Hallucinations 1. Much improved by this AM 2. Suspect possible polypharmacy as pt reports taking both PO morphine and oxycodone PRN prior to admit 2. Low grade fevers 1. Cultures pending 2. CXR without signs of PNA 3. Pt was started on empiric broad spectrum abx 3. Weakness 1. Consult PT/OT 4. HTN 1. BP stable, controlled 5. Hx metastatic prostate cancer 1. Numerous bone mets on cxr 2. Pt on Xtandi 6. DVT prophylaxis 1. Lovenox subQ  Code Status: Full Family Communication: Pt in room (indicate person spoken with, relationship, and if by phone, the number) Disposition Plan: Pending   Consultants:    Procedures:    Antibiotics:  Vancomycin 7/20>>>  Zosyn 7/20>>>  HPI/Subjective: Reports feeling much improved and less confused today  Objective: Filed Vitals:   10/07/14 2146 10/08/14 0203 10/08/14 0602 10/08/14 1017  BP: 110/56 116/54 110/55 99/56  Pulse: 113 104 102 92  Temp: 98.3 F (36.8 C) 99.1 F (37.3 C) 98.5 F (36.9 C) 98.6 F (37 C)  TempSrc: Oral Oral Oral Oral  Resp: 18 18 18 21   Height:      Weight:      SpO2: 97% 93% 99% 94%    Intake/Output Summary (Last 24 hours) at 10/08/14 1552 Last data filed at 10/07/14 2301  Gross per 24 hour  Intake      0 ml  Output    100 ml  Net   -100 ml   Filed Weights   10/07/14 2143  Weight: 103 kg (227 lb 1.2 oz)    Exam:   General:  Awake, in nad  Cardiovascular: regular, s1, s2  Respiratory: normal resp effort, no wheezing  Abdomen: soft,nondistended  Musculoskeletal: perfused, no clubbing   Data Reviewed: Basic Metabolic Panel:  Recent Labs Lab 10/07/14 1546 10/08/14 0510  NA 135 132*  K 4.1 3.3*  CL 94* 98*  CO2 29 25  GLUCOSE 110* 110*  BUN 15 15  CREATININE 0.98 0.98  CALCIUM 9.3 8.0*   Liver Function  Tests:  Recent Labs Lab 10/07/14 1546  AST 35  ALT 10*  ALKPHOS 1409*  BILITOT 1.4*  PROT 8.2*  ALBUMIN 2.7*   No results for input(s): LIPASE, AMYLASE in the last 168 hours. No results for input(s): AMMONIA in the last 168 hours. CBC:  Recent Labs Lab 10/07/14 1546 10/08/14 0510 10/08/14 1417  WBC 11.3* 9.4  --   NEUTROABS 8.7*  --   --   HGB 8.7* 7.7* 7.5*  HCT 28.5* 25.2* 24.2*  MCV 76.6* 75.4*  --   PLT 361 273  --    Cardiac Enzymes: No results for input(s): CKTOTAL, CKMB, CKMBINDEX, TROPONINI in the last 168 hours. BNP (last 3 results) No results for input(s): BNP in the last 8760 hours.  ProBNP (last 3 results) No results for input(s): PROBNP in the last 8760 hours.  CBG: No results for input(s): GLUCAP in the last 168 hours.  Recent Results (from the past 240 hour(s))  Culture, blood (routine x 2)     Status: None (Preliminary result)   Collection Time: 10/07/14  4:20 PM  Result Value Ref Range Status   Specimen Description BLOOD LEFT ANTECUBITAL  Final   Special Requests BOTTLES DRAWN AEROBIC AND ANAEROBIC 5 CC EA  Final   Culture   Final    NO GROWTH < 24 HOURS Performed at [facility]  Report Status PENDING  Incomplete  Culture, blood (routine x 2)     Status: None (Preliminary result)   Collection Time: 10/07/14  4:20 PM  Result Value Ref Range Status   Specimen Description BLOOD RIGHT ANTECUBITAL  Final   Special Requests BOTTLES DRAWN AEROBIC AND ANAEROBIC 5 CC EA  Final   Culture   Final    NO GROWTH < 24 HOURS Performed at [facility]    Report Status PENDING  Incomplete     Studies: Dg Chest 2 View  10/07/2014   CLINICAL DATA:  Mental status change, hallucinations, prostate malignancy with widespread bony metastases.  EXAM: CHEST  2 VIEW  COMPARISON:  None available in PACs  FINDINGS: The lungs are adequately inflated. There is no focal infiltrate. There is no pleural effusion. The cardiac silhouette is top-normal in  size. The pulmonary vascularity is normal. The mediastinum is normal in width. There is diffuse skeletal sclerosis consistent with bony metastatic disease.  IMPRESSION: There is no acute cardiopulmonary abnormality. There are widespread skeletal metastases.   Electronically Signed   By: David  Martinique M.D.   On: 10/07/2014 17:03   Ct Head Wo Contrast  10/07/2014   CLINICAL DATA:  History of prostate carcinoma with metastatic bone disease and hallucinations for 2 days, possible medication related  EXAM: CT HEAD WITHOUT CONTRAST  TECHNIQUE: Contiguous axial images were obtained from the base of the skull through the vertex without intravenous contrast.  COMPARISON:  None.  FINDINGS: Bony calvarium is intact. No gross soft tissue abnormality is noted. Slight asymmetrical dilatation of the right lateral ventricle is noted. No focal mass lesion or acute hemorrhage is noted. No findings to suggest acute infarct are seen.  IMPRESSION: No acute abnormality noted.   Electronically Signed   By: Inez Catalina M.D.   On: 10/07/2014 17:48   US Abdomen Limited Ruq  10/08/2014   CLINICAL DATA:  Patient with elevated LFTs.  Prior cholecystectomy.  EXAM: US ABDOMEN LIMITED - RIGHT UPPER QUADRANT  COMPARISON:  None.  FINDINGS: Gallbladder:  Surgically absent  Common bile duct:  Diameter: 5 mm  Liver:  Diffusely increased in echogenicity. There are a few 6-8 mm hypoechoic lesions within the liver, too small to characterize.  IMPRESSION: Hepatic steatosis.  No intrahepatic or extrahepatic biliary ductal dilatation given post cholecystectomy state.  Few tiny (6-8 mm) hypoechoic lesions in the liver, too small to characterize.   Electronically Signed   By: Lovey Newcomer M.D.   On: 10/08/2014 09:47    Scheduled Meds: . antiseptic oral rinse  7 mL Mouth Rinse q12n4p  . calcium-vitamin D  2 tablet Oral BID  . chlorhexidine  15 mL Mouth Rinse BID  . enoxaparin (LOVENOX) injection  40 mg Subcutaneous QHS  . feeding supplement  (ENSURE ENLIVE)  237 mL Oral BID BM  . latanoprost  1 drop Both Eyes QHS  . piperacillin-tazobactam (ZOSYN)  IV  3.375 g Intravenous 3 times per day  . vancomycin  750 mg Intravenous Q12H   Continuous Infusions:   Principal Problem:   Acute encephalopathy Active Problems:   Fever   Hallucinations   HTN (hypertension)   Weakness generalized   Prostate cancer metastatic to multiple sites   Malnutrition of moderate degree   [facility], [place] [facility] Pager [phone]. If 7PM-7AM, please contact night-coverage at [email or web address], password [facility] 10/08/2014, 3:52 PM  LOS: 0 days

## 2014-10-08 NOTE — Progress Notes (Signed)
I faxed med neccessity form [phone] and called to let Webb Silversmith daughter know.

## 2014-10-09 DIAGNOSIS — R509 Fever, unspecified: Secondary | ICD-10-CM

## 2014-10-09 DIAGNOSIS — R4182 Altered mental status, unspecified: Secondary | ICD-10-CM

## 2014-10-09 DIAGNOSIS — R627 Adult failure to thrive: Secondary | ICD-10-CM

## 2014-10-09 DIAGNOSIS — R443 Hallucinations, unspecified: Secondary | ICD-10-CM

## 2014-10-09 DIAGNOSIS — C7951 Secondary malignant neoplasm of bone: Secondary | ICD-10-CM

## 2014-10-09 DIAGNOSIS — R972 Elevated prostate specific antigen [PSA]: Secondary | ICD-10-CM

## 2014-10-09 DIAGNOSIS — C259 Malignant neoplasm of pancreas, unspecified: Secondary | ICD-10-CM

## 2014-10-09 DIAGNOSIS — R748 Abnormal levels of other serum enzymes: Secondary | ICD-10-CM

## 2014-10-09 DIAGNOSIS — G9340 Encephalopathy, unspecified: Secondary | ICD-10-CM

## 2014-10-09 LAB — PROTIME-INR
INR: 1.63 — ABNORMAL HIGH (ref 0.00–1.49)
Prothrombin Time: 19.4 seconds — ABNORMAL HIGH (ref 11.6–15.2)

## 2014-10-09 LAB — CBC
HCT: 23.5 % — ABNORMAL LOW (ref 39.0–52.0)
HEMOGLOBIN: 7.4 g/dL — AB (ref 13.0–17.0)
MCH: 23.9 pg — ABNORMAL LOW (ref 26.0–34.0)
MCHC: 31.5 g/dL (ref 30.0–36.0)
MCV: 75.8 fL — AB (ref 78.0–100.0)
Platelets: 321 10*3/uL (ref 150–400)
RBC: 3.1 MIL/uL — AB (ref 4.22–5.81)
RDW: 19.4 % — ABNORMAL HIGH (ref 11.5–15.5)
WBC: 9.1 10*3/uL (ref 4.0–10.5)

## 2014-10-09 LAB — BASIC METABOLIC PANEL
Anion gap: 9 (ref 5–15)
BUN: 12 mg/dL (ref 6–20)
CO2: 27 mmol/L (ref 22–32)
Calcium: 8.2 mg/dL — ABNORMAL LOW (ref 8.9–10.3)
Chloride: 100 mmol/L — ABNORMAL LOW (ref 101–111)
Creatinine, Ser: 0.96 mg/dL (ref 0.61–1.24)
GFR calc non Af Amer: >60 mL/min (ref >60)
Glucose, Bld: 102 mg/dL — ABNORMAL HIGH (ref 65–99)
POTASSIUM: 3.2 mmol/L — AB (ref 3.5–5.1)
Sodium: 136 mmol/L (ref 135–145)

## 2014-10-09 LAB — OCCULT BLOOD X 1 CARD TO LAB, STOOL: FECAL OCCULT BLD: NEGATIVE

## 2014-10-09 MED ORDER — POTASSIUM CHLORIDE CRYS ER 20 MEQ PO TBCR
40.0000 meq | EXTENDED_RELEASE_TABLET | Freq: Two times a day (BID) | ORAL | Status: AC
  Administered 2014-10-09 (×2): 40 meq via ORAL
  Filled 2014-10-09 (×2): qty 2

## 2014-10-09 MED ORDER — FENTANYL 12 MCG/HR TD PT72
12.5000 ug | MEDICATED_PATCH | TRANSDERMAL | Status: DC
  Administered 2014-10-09 – 2014-10-12 (×2): 12.5 ug via TRANSDERMAL
  Filled 2014-10-09 (×2): qty 1

## 2014-10-09 MED ORDER — FENTANYL 25 MCG/HR TD PT72: 25.0000 ug | MEDICATED_PATCH | TRANSDERMAL | Status: DC

## 2014-10-09 MED ORDER — PAMIDRONATE DISODIUM 90 MG/10ML IV SOLN
90.0000 mg | Freq: Once | INTRAVENOUS | Status: AC
  Administered 2014-10-09: 90 mg via INTRAVENOUS
  Filled 2014-10-09: qty 10

## 2014-10-09 MED ORDER — HYDROMORPHONE HCL 2 MG PO TABS
2.0000 mg | ORAL_TABLET | ORAL | Status: DC | PRN
  Administered 2014-10-09 – 2014-10-10 (×2): 2 mg via ORAL
  Filled 2014-10-09 (×2): qty 1

## 2014-10-09 MED ORDER — DEXAMETHASONE 4 MG PO TABS
4.0000 mg | ORAL_TABLET | Freq: Every day | ORAL | Status: DC
  Administered 2014-10-09 – 2014-10-14 (×6): 4 mg via ORAL
  Filled 2014-10-09 (×6): qty 1

## 2014-10-09 NOTE — Progress Notes (Signed)
[facility] PROGRESS NOTE  Clarence Pollard TGP:[id number] DOB: Oct 29, 1940 DOA: 10/07/2014 PCP: Woody Seller, MD  Assessment/Plan: 1. Acute encephalopathy/Hallucinations 1. Improved since this admission 2. Suspect possible polypharmacy as pt reports taking both PO morphine and oxycodone PRN prior to admit 3. Also possibly related to metastatic disease given widespread cancer spread 2. Low grade fevers 1. Afebrile since admit 2. Blood cx neg 3. CXR without signs of PNA 4. Pt was started on empiric broad spectrum abx on admit 5. Question if low grade temps were significant 6. Will hold further abx at this time and monitor for fevers 3. Weakness 1. Consult PT/OT 4. HTN 1. BP stable, controlled 5. Hx metastatic prostate cancer 1. Numerous bone mets on cxr 2. Pt noted to be on Xtandi 3. Discussed with Oncology (Dr. Alen Blew) and appreciate input 4. Overall poor prognosis with limited tolerability to treatments. As such, recommendation for hospice 5. Have consulted Palliative Care for assistance with pain management as well as for end of life issues/goals of care 6. DVT prophylaxis 1. Lovenox subQ while inpatient  Code Status: Full Family Communication: Pt in room, daughter over phone Disposition Plan: Pending   Consultants:    Procedures:    Antibiotics:  Vancomycin 7/20>>>  Zosyn 7/20>>>  HPI/Subjective: Feels better today. No complaints  Objective: Filed Vitals:   10/09/14 0058 10/09/14 0525 10/09/14 0946 10/09/14 1437  BP: 86/45 85/47 100/56 120/61  Pulse: 102 89 87 95  Temp: 98.6 F (37 C) 98 F (36.7 C) 98 F (36.7 C) 98 F (36.7 C)  TempSrc: Oral Oral Oral Oral  Resp: 20 20 20 20   Height:      Weight:      SpO2: 92% 93% 93% 97%    Intake/Output Summary (Last 24 hours) at 10/09/14 1810 Last data filed at 10/09/14 0906  Gross per 24 hour  Intake    240 ml  Output      0 ml  Net    240 ml   Filed Weights   10/07/14 2143  Weight: 103 kg  (227 lb 1.2 oz)    Exam:   General:  Awake, laying in bed, in nad  Cardiovascular: regular, s1, s2  Respiratory: normal resp effort, no wheezing  Abdomen: soft,nondistended, pos BS  Musculoskeletal: perfused, no clubbing, no cyanosis  Data Reviewed: Basic Metabolic Panel:  Recent Labs Lab 10/07/14 1546 10/08/14 0510 10/09/14 0523  NA 135 132* 136  K 4.1 3.3* 3.2*  CL 94* 98* 100*  CO2 29 25 27   GLUCOSE 110* 110* 102*  BUN 15 15 12   CREATININE 0.98 0.98 0.96  CALCIUM 9.3 8.0* 8.2*   Liver Function Tests:  Recent Labs Lab 10/07/14 1546  AST 35  ALT 10*  ALKPHOS 1409*  BILITOT 1.4*  PROT 8.2*  ALBUMIN 2.7*   No results for input(s): LIPASE, AMYLASE in the last 168 hours. No results for input(s): AMMONIA in the last 168 hours. CBC:  Recent Labs Lab 10/07/14 1546 10/08/14 0510 10/08/14 1417 10/09/14 0523  WBC 11.3* 9.4  --  9.1  NEUTROABS 8.7*  --   --   --   HGB 8.7* 7.7* 7.5* 7.4*  HCT 28.5* 25.2* 24.2* 23.5*  MCV 76.6* 75.4*  --  75.8*  PLT 361 273  --  321   Cardiac Enzymes: No results for input(s): CKTOTAL, CKMB, CKMBINDEX, TROPONINI in the last 168 hours. BNP (last 3 results) No results for input(s): BNP in the last 8760 hours.  ProBNP (last 3  results) No results for input(s): PROBNP in the last 8760 hours.  CBG: No results for input(s): GLUCAP in the last 168 hours.  Recent Results (from the past 240 hour(s))  Culture, blood (routine x 2)     Status: None (Preliminary result)   Collection Time: 10/07/14  4:20 PM  Result Value Ref Range Status   Specimen Description BLOOD LEFT ANTECUBITAL  Final   Special Requests BOTTLES DRAWN AEROBIC AND ANAEROBIC 5 CC EA  Final   Culture   Final    NO GROWTH 2 DAYS Performed at [facility]    Report Status PENDING  Incomplete  Culture, blood (routine x 2)     Status: None (Preliminary result)   Collection Time: 10/07/14  4:20 PM  Result Value Ref Range Status   Specimen Description  BLOOD RIGHT ANTECUBITAL  Final   Special Requests BOTTLES DRAWN AEROBIC AND ANAEROBIC 5 CC EA  Final   Culture   Final    NO GROWTH 2 DAYS Performed at [facility]    Report Status PENDING  Incomplete     Studies: US Abdomen Limited Ruq  11/03/2014   CLINICAL DATA:  Patient with elevated LFTs.  Prior cholecystectomy.  EXAM: US ABDOMEN LIMITED - RIGHT UPPER QUADRANT  COMPARISON:  None.  FINDINGS: Gallbladder:  Surgically absent  Common bile duct:  Diameter: 5 mm  Liver:  Diffusely increased in echogenicity. There are a few 6-8 mm hypoechoic lesions within the liver, too small to characterize.  IMPRESSION: Hepatic steatosis.  No intrahepatic or extrahepatic biliary ductal dilatation given post cholecystectomy state.  Few tiny (6-8 mm) hypoechoic lesions in the liver, too small to characterize.   Electronically Signed   By: Lovey Newcomer M.D.   On: Nov 03, 2014 09:47    Scheduled Meds: . antiseptic oral rinse  7 mL Mouth Rinse q12n4p  . calcium-vitamin D  2 tablet Oral BID  . chlorhexidine  15 mL Mouth Rinse BID  . dexamethasone  4 mg Oral Daily  . enoxaparin (LOVENOX) injection  40 mg Subcutaneous QHS  . feeding supplement (ENSURE ENLIVE)  237 mL Oral BID BM  . fentaNYL  12.5 mcg Transdermal Q72H  . latanoprost  1 drop Both Eyes QHS  . pamidronate (AREDIA) 90 mg IVPB  90 mg Intravenous Once  . potassium chloride  40 mEq Oral BID   Continuous Infusions:   Principal Problem:   Acute encephalopathy Active Problems:   Fever   Hallucinations   HTN (hypertension)   Weakness generalized   Prostate cancer metastatic to multiple sites   Malnutrition of moderate degree   Jeraldin Fesler, [place] [facility] Pager [phone]. If 7PM-7AM, please contact night-coverage at [email or web address], password [facility] 10/09/2014, 6:10 PM  LOS: 1 day

## 2014-10-09 NOTE — Progress Notes (Signed)
ANTIBIOTIC CONSULT NOTE - FOLLOW UP  Pharmacy Consult for vancomycin and zosyn Indication: empiric  No Known Allergies  Patient Measurements: Height: 6' (182.9 cm) Weight: 227 lb 1.2 oz (103 kg) IBW/kg (Calculated) : 77.6   Vital Signs: Temp: 98 F (36.7 C) (07/22 0525) Temp Source: Oral (07/22 0525) BP: 85/47 mmHg (07/22 0525) Pulse Rate: 89 (07/22 0525) Intake/Output from previous day: 07/21 0701 - 07/22 0700 In: 360 [P.O.:360] Out: 175 [Urine:175] Intake/Output from this shift: Total I/O In: 240 [P.O.:240] Out: -   Labs:  Recent Labs  10/07/14 1546 10/08/14 0510 10/08/14 1417 10/09/14 0523  WBC 11.3* 9.4  --  9.1  HGB 8.7* 7.7* 7.5* 7.4*  PLT 361 273  --  321  CREATININE 0.98 0.98  --  0.96   Estimated Creatinine Clearance: 85.1 mL/min (by C-G formula based on Cr of 0.96). No results for input(s): VANCOTROUGH, VANCOPEAK, VANCORANDOM, GENTTROUGH, GENTPEAK, GENTRANDOM, TOBRATROUGH, TOBRAPEAK, TOBRARND, AMIKACINPEAK, AMIKACINTROU, AMIKACIN in the last 72 hours.   Microbiology: Recent Results (from the past 720 hour(s))  Culture, blood (routine x 2)     Status: None (Preliminary result)   Collection Time: 10/07/14  4:20 PM  Result Value Ref Range Status   Specimen Description BLOOD LEFT ANTECUBITAL  Final   Special Requests BOTTLES DRAWN AEROBIC AND ANAEROBIC 5 CC EA  Final   Culture   Final    NO GROWTH < 24 HOURS Performed at [facility]    Report Status PENDING  Incomplete  Culture, blood (routine x 2)     Status: None (Preliminary result)   Collection Time: 10/07/14  4:20 PM  Result Value Ref Range Status   Specimen Description BLOOD RIGHT ANTECUBITAL  Final   Special Requests BOTTLES DRAWN AEROBIC AND ANAEROBIC 5 CC EA  Final   Culture   Final    NO GROWTH < 24 HOURS Performed at [facility]    Report Status PENDING  Incomplete    Anti-infectives    Start     Dose/Rate Route Frequency Ordered Stop   10/08/14 0800  vancomycin  (VANCOCIN) IVPB 750 mg/150 ml premix     750 mg 150 mL/hr over 60 Minutes Intravenous Every 12 hours 10/07/14 2154     10/07/14 2200  piperacillin-tazobactam (ZOSYN) IVPB 3.375 g     3.375 g 12.5 mL/hr over 240 Minutes Intravenous 3 times per day 10/07/14 2147     10/07/14 1815  vancomycin (VANCOCIN) IVPB 1000 mg/200 mL premix     1,000 mg 200 mL/hr over 60 Minutes Intravenous  Once 10/07/14 1809 10/07/14 2009   10/07/14 1815  piperacillin-tazobactam (ZOSYN) IVPB 3.375 g     3.375 g 12.5 mL/hr over 240 Minutes Intravenous  Once 10/07/14 1809 10/07/14 1852      Assessment: Patient's a 74 y.o M on vancomycin and zosyn day #3 for broad empiric coverage for suspected sepsis/fever on 7/20.  Afeb morning, wbc down wnl, scr ok (crcl~83)  7/20 CXR: no infiltrate noted  7/20 zosyn>> 7/20 vanc>>  7/20 bcx x2:  ngtd   Goal of Therapy:  Vancomycin trough level 15-20 mcg/ml  Plan:  - continue vancomycin 750mg  IV q12h - continue zosyn 3.375 gm IV q8h (infuse over 4 hours) - Will plan on checking vancomycin trough level if to continue with IV abx for >5 days.  Jaquarious Grey P 10/09/2014,9:43 AM

## 2014-10-09 NOTE — Progress Notes (Signed)
Clinical Social Work  Patient was discussed during progression meeting and MD reports a PMT consult was placed to complete [place] meeting. CSW spoke with Lannette Donath at [facility] who is agreeable to accept patient back over the weekend if stable but RN only at facility until noon so it would need to be an early DC. CSW updated attending MD on DC plans. CM aware of possible hospice referral. ALF prefers hospital arrange hospice and [facility]. Signed FL2 on chart and report will be left for weekend CSW.  [place], [place] [phone]

## 2014-10-09 NOTE — Care Management Important Message (Signed)
Important Message  Patient Details  Name: Clarence Pollard MRN: [id number] Date of Birth: Feb 23, 1941   Medicare Important Message Given:  Yes-second notification given    Shelda Altes 10/09/2014, 2:28 [place] Message  Patient Details  Name: Clarence Pollard MRN: [id number] Date of Birth: 1940/05/17   Medicare Important Message Given:  Yes-second notification given    Shelda Altes 10/09/2014, 2:28 PM

## 2014-10-09 NOTE — Evaluation (Signed)
Physical Therapy Evaluation Patient Details Name: Clarence Pollard MRN: [id number] DOB: 1940-06-18 Today's Date: 10/09/2014   History of Present Illness  pt was admitted for acute encephalopathy.  He has a h/o metastatic prostate CA   Clinical Impression  Pt admitted as above and presenting with functional mobility limitations 2* generalized weakness/deconditioning and ambulatory instability.  Pt would benefit from follow up HHPT at current ALF dependent on decisions made following upcoming palliative care consult.   Follow Up Recommendations Home health PT    Equipment Recommendations  None recommended by PT    Recommendations for Other Services OT consult     Precautions / Restrictions Precautions Precautions: Fall Restrictions Weight Bearing Restrictions: No      Mobility  Bed Mobility Overal bed mobility: Needs Assistance Bed Mobility: Supine to Sit     Supine to sit: Min assist;HOB elevated     General bed mobility comments: Pt utilized rail to self assist; cues for sequence and min assist to complete move to sitting  Transfers Overall transfer level: Needs assistance Equipment used: Rolling walker (2 wheeled) Transfers: Sit to/from Stand Sit to Stand: Min assist Stand pivot transfers: Min assist       General transfer comment: cues for LE management and use of UEs to self assist and basic saftey awareness  chair to [facility] to chair  Ambulation/Gait Ambulation/Gait assistance: Min assist Ambulation Distance (Feet): 160 Feet Assistive device: Rolling walker (2 wheeled) Gait Pattern/deviations: Step-through pattern;Decreased step length - right;Decreased step length - left;Shuffle;Trunk flexed     General Gait Details: cues for posture, position from RW, pacing and saftey awareness with increasing fatigue  Stairs            Wheelchair Mobility    Modified Rankin (Stroke Patients Only)       Balance Overall balance assessment: Needs  assistance Sitting-balance support: Feet supported Sitting balance-Leahy Scale: Good     Standing balance support: Bilateral upper extremity supported Standing balance-Leahy Scale: Poor                               Pertinent Vitals/Pain Pain Assessment: Faces Pain Score: 8  Pain Location: tailbone; RUE also hurts.  Had  Pain Descriptors / Indicators: [place] expects to be discharged to:: Assisted living               Home Equipment: Gilford Rile - 2 wheels Additional Comments: pt is from [facility].  He has been able to do own ADLs.  He thinks toilet is normal height and he has a grab bar next to it.  He also has sock aide and long shoehorn at home.  He hasn't needed to use them.    Prior Function Level of Independence: Needs assistance   Gait / Transfers Assistance Needed: RW     Comments: Per CSW note, pt assisted with bathing, dressing      Hand Dominance   Dominant Hand: Left    Extremity/Trunk Assessment   Upper Extremity Assessment: Generalized weakness           Lower Extremity Assessment: Generalized weakness      Cervical / Trunk Assessment: Kyphotic  Communication   Communication: No difficulties  Cognition Arousal/Alertness: Awake/alert Behavior During Therapy: WFL for tasks assessed/performed Overall Cognitive Status: Within Functional Limits for tasks assessed  General Comments      Exercises        Assessment/Plan    PT Assessment Patient needs continued PT services  PT Diagnosis Difficulty walking   PT Problem List Decreased strength;Decreased activity tolerance;Decreased balance;Decreased mobility;Decreased knowledge of use of DME;Obesity  PT Treatment Interventions DME instruction;Gait training;Functional mobility training;Therapeutic activities;Therapeutic exercise;Balance training;Patient/family education   PT Goals (Current goals can be found in the  Care Plan section) Acute Rehab PT Goals Patient Stated Goal: Resume previous lifestyle but considering move to hospice PT Goal Formulation: With patient Time For Goal Achievement: 10/23/14 Potential to Achieve Goals: Fair    Frequency Min 3X/week   Barriers to discharge        Co-evaluation               End of Session Equipment Utilized During Treatment: Gait belt Activity Tolerance: No increased pain;Patient limited by fatigue Patient left: in chair;with call bell/phone within reach Nurse Communication: Mobility status         Time: 1128-1210 PT Time Calculation (min) (ACUTE ONLY): 42 min   Charges:   PT Evaluation $Initial PT Evaluation Tier I: 1 Procedure PT Treatments $Gait Training: 8-22 mins   PT G Codes:        Clorene Nerio October 19, 2014, 1:21 PM

## 2014-10-09 NOTE — Progress Notes (Signed)
IP PROGRESS NOTE  Subjective:   Patient is known to me a long-standing history of prostate cancer. He has metastatic disease to the bone and presented with altered mental status and failure to thrive. He is unable to ambulate and continues to have functional decline. He also some episodic hallucination and fevers. His septic workup has been unrevealing so far throughout his hospitalization.  His mentation has improved since he was hospitalized but continues to be quite lethargic and debilitated likely from his malignancy. His pain is reasonably controlled although he does report occasional pain in his sternum.  Objective:  Vital signs in last 24 hours: Temp:  [98 F (36.7 C)-98.6 F (37 C)] 98 F (36.7 C) (07/22 1437) Pulse Rate:  [87-102] 95 (07/22 1437) Resp:  [20] 20 (07/22 1437) BP: (85-120)/(45-61) 120/61 mmHg (07/22 1437) SpO2:  [92 %-97 %] 97 % (07/22 1437) Weight change:  Last BM Date: 10/08/14  Intake/Output from previous day: 07/21 0701 - 07/22 0700 In: 360 [P.O.:360] Out: 175 [Urine:175]  Mouth: mucous membranes moist, pharynx normal without lesions Resp: clear to auscultation bilaterally Cardio: regular rate and rhythm, S1, S2 normal, no murmur, click, rub or gallop GI: soft, non-tender; bowel sounds normal; no masses,  no organomegaly Extremities: extremities normal, atraumatic, no cyanosis or edema  Portacath/PICC-without erythema  Lab Results:  Recent Labs  10/08/14 0510 10/08/14 1417 10/09/14 0523  WBC 9.4  --  9.1  HGB 7.7* 7.5* 7.4*  HCT 25.2* 24.2* 23.5*  PLT 273  --  321    BMET  Recent Labs  10/08/14 0510 10/09/14 0523  NA 132* 136  K 3.3* 3.2*  CL 98* 100*  CO2 25 27  GLUCOSE 110* 102*  BUN 15 12  CREATININE 0.98 0.96  CALCIUM 8.0* 8.2*   Results for Clarence Pollard, Clarence Pollard (MRN [id number]) as of 10/09/2014 14:56  Ref. Range 09/01/2014 11:41 10/06/2014 09:52  PSA Latest Ref Range: <=4.00 ng/mL 94.41 (H) 157.20 (H)        Studies/Results: EXAM: NUCLEAR MEDICINE WHOLE BODY BONE SCAN  TECHNIQUE: Whole body anterior and posterior images were obtained approximately 3 hours after intravenous injection of radiopharmaceutical.  RADIOPHARMACEUTICALS: 25.4 mCi Technetium-62m MDP IV  COMPARISON: March 17, 2014  FINDINGS: There is widespread bony metastatic disease. There are multiple areas of increased radiotracer uptake in the bony calvarium, increased compared to previous study. An area of increased uptake in the lateral left orbit region is stable. There are multiple areas of abnormal uptake throughout virtually all ribs at multiple sites, progressed from prior study. There is increased uptake in the distal clavicles bilaterally, new. Increased uptake in the proximal left clavicle is stable. There are several new foci of increased uptake in each humerus. There are multiple areas of increased uptake throughout the spine with several new areas of increased uptake in the lumbar spine. Areas of prior increased uptake in the mid to lower thoracic spine actually show less uptake at this time, possibly due to more aggressive of neoplasm at this time resulting in relative photopenia. Increased uptake in the left iliac crest remains. There is increased uptake in the sacrum diffusely compared to the prior study. There are several foci of abnormal increased uptake in each femur, increased compared to prior study, particularly on the right.  There is now limited visualization of the kidneys.  IMPRESSION: Widespread bony metastatic disease, increased overall compared to most recent prior study. Limited visualization of the kidneys is probably due to the extensive bony uptake of radiotracer  throughout the axial and proximal appendicular skeletal regions as well as in the bony calvarium at multiple sites, new.   Electronically Signed  By: Lowella Grip III M.D.  On: 10/06/2014  14:02           Vitals     Height Weight BMI (Calculated)            Interpretation Summary     CLINICAL DATA: Prostate carcinoma  EXAM: NUCLEAR MEDICINE WHOLE BODY BONE SCAN  TECHNIQUE: Whole body anterior and posterior images were obtained approximately 3 hours after intravenous injection of radiopharmaceutical.  RADIOPHARMACEUTICALS: 25.4 mCi Technetium-15m MDP IV  COMPARISON: March 17, 2014  FINDINGS: There is widespread bony metastatic disease. There are multiple areas of increased radiotracer uptake in the bony calvarium, increased compared to previous study. An area of increased uptake in the lateral left orbit region is stable. There are multiple areas of abnormal uptake throughout virtually all ribs at multiple sites, progressed from prior study. There is increased uptake in the distal clavicles bilaterally, new. Increased uptake in the proximal left clavicle is stable. There are several new foci of increased uptake in each humerus. There are multiple areas of increased uptake throughout the spine with several new areas of increased uptake in the lumbar spine. Areas of prior increased uptake in the mid to lower thoracic spine actually show less uptake at this time, possibly due to more aggressive of neoplasm at this time resulting in relative photopenia. Increased uptake in the left iliac crest remains. There is increased uptake in the sacrum diffusely compared to the prior study. There are several foci of abnormal increased uptake in each femur, increased compared to prior study, particularly on the right.  There is now limited visualization of the kidneys.  IMPRESSION: Widespread bony metastatic disease, increased overall compared to most recent prior study. Limited visualization of the kidneys is probably due to the extensive bony uptake of radiotracer throughout the axial and proximal appendicular skeletal regions as well as  in the bony calvarium at multiple sites, new.      Medications: I have reviewed the patient's current medications.  Assessment/Plan:  74 year old gentleman with the following issues:  1. Metastatic prostate cancer with extensive disease to the bone. He had a recent bone scan done which I have reviewed with the patient today and showed clear-cut progression of his disease. His PSA has increased to 157 from 94 within the last month.  Options of treatment were discussed with the patient that would include systemic chemotherapy versus supportive care only. I feel given his debilitated status he would unlikely benefit from chemotherapy. His performance status is rather poor and a therapeutic index will be very narrow. He inquired about complications from chemotherapy today which I have discussed with him extensively. These would include nausea, vomiting, myelosuppression, possible sepsis, neuropathy among others. The benefit would include Decrease in his PSA and possibly improve his quality of life. After discussing the logistics of administration of chemotherapy I feel that he is not a candidate for it.  I have recommended hospice care moving forward and appreciate the input from the palliative medicine service regarding his disposition.  I will discuss these findings with his daughter via the phone once she is available.  2. Pain control: He was to be reasonably controlled at this time and appreciate input from the palliative medicine service regarding this issue.  3. Prognosis: Is rather poor with limited life expectancy given the extensive widespread cancer and poor performance status.  LOS: 1 day   [facility] 10/09/2014, 2:54 PM

## 2014-10-09 NOTE — Evaluation (Signed)
Occupational Therapy Evaluation Patient Details Name: Clarence Pollard MRN: [id number] DOB: 03-10-1941 Today's Date: 10/09/2014    History of Present Illness pt was admitted for acute encephalopathy.  He has a h/o metastatic prostate CA    Clinical Impression   This 74 year old man was admitted for the above. He will benefit from skilled OT to increase safety and independence with adls.  Pt was mod I prior to admission and he needs min A for UB and transfers and mod/max A for LB adls due to pain.  Goals are for supervision level with AE    Follow Up Recommendations  Home health OT    Equipment Recommendations   (to be further assessed, possibly 3:1 for over toilet)    Recommendations for Other Services       Precautions / Restrictions Precautions Precautions: Fall Restrictions Weight Bearing Restrictions: No      Mobility Bed Mobility Overal bed mobility: Needs Assistance Bed Mobility: Supine to Sit     Supine to sit: Min assist;HOB elevated     General bed mobility comments: Pt utilized rail to self assist; cues for sequence and min assist to complete move to sitting  Transfers Overall transfer level: Needs assistance Equipment used: Rolling walker (2 wheeled) Transfers: Sit to/from Stand Sit to Stand: Min assist Stand pivot transfers: Min assist       General transfer comment: cues for hand placement    Balance Overall balance assessment: Needs assistance Sitting-balance support: Feet supported Sitting balance-Leahy Scale: Good     Standing balance support: Bilateral upper extremity supported Standing balance-Leahy Scale: Poor                              ADL Overall ADL's : Needs assistance/impaired     Grooming: Set up;Sitting;Wash/dry hands   Upper Body Bathing: Sitting;Minimal assitance   Lower Body Bathing: Moderate assistance;Sit to/from stand   Upper Body Dressing : Minimal assistance;Sitting   Lower Body Dressing: Maximal  assistance;Sit to/from stand   Toilet Transfer: Minimal assistance;Stand-pivot (to bed)             General ADL Comments: ADLs affected by pain.  Pt is usually independent and he has a sock aide and long sponge at home.  He does not have reacher.  Pt has not used sock aide, as he hadn't needed it.  Pain started recently     Vision     Perception     Praxis      Pertinent Vitals/Pain Pain Assessment: Faces Pain Score: 8  Pain Location: tailbone; RUE also hurts.  Had calf cramps also during eval Pain Descriptors / Indicators: Aching Pain Intervention(s): Limited activity within patient's tolerance;Monitored during session;Repositioned (helped pt flex toes for cramps)     Hand Dominance Left   Extremity/Trunk Assessment Upper Extremity Assessment Upper Extremity Assessment: Generalized weakness;LUE deficits/detail LUE Deficits / Details: did not fully assess due to pain   Lower Extremity Assessment Lower Extremity Assessment: Generalized weakness   Cervical / Trunk Assessment Cervical / Trunk Assessment: Kyphotic   Communication Communication Communication: No difficulties   Cognition Arousal/Alertness: Awake/alert Behavior During Therapy: WFL for tasks assessed/performed Overall Cognitive Status: Within Functional Limits for tasks assessed                     General Comments       Exercises       Shoulder Instructions  Home Living Family/patient expects to be discharged to:: Assisted living                             Home Equipment: Gilford Rile - 2 wheels   Additional Comments: pt is from [facility].  He has been able to do own ADLs.  He thinks toilet is normal height and he has a grab bar next to it.  He also has sock aide and long shoehorn at home.  He hasn't needed to use them.      Prior Functioning/Environment Level of Independence: Needs assistance  Gait / Transfers Assistance Needed: RW     Comments: Per CSW note,  pt assisted with bathing, dressing     OT Diagnosis: Acute pain   OT Problem List: Pain;Decreased strength;Decreased activity tolerance;Impaired balance (sitting and/or standing);Decreased knowledge of use of DME or AE   OT Treatment/Interventions: Self-care/ADL training;DME and/or AE instruction;Patient/family education;Balance training;Therapeutic activities    OT Goals(Current goals can be found in the care plan section) Acute Rehab OT Goals Patient Stated Goal: decreased pain; be as independent as possible OT Goal Formulation: With patient Time For Goal Achievement: 10/16/14 Potential to Achieve Goals: Good ADL Goals Pt Will Perform Lower Body Bathing: with supervision;sit to/from stand;with adaptive equipment Pt Will Perform Lower Body Dressing: with supervision;with adaptive equipment;sit to/from stand Pt Will Transfer to Toilet: with supervision;ambulating;regular height toilet;bedside commode;grab bars (vs) Pt Will Perform Toileting - Clothing Manipulation and hygiene: with supervision;sit to/from stand Additional ADL Goal #1: pt will perform UB adls with set up  OT Frequency: Min 2X/week   Barriers to D/C:            Co-evaluation              End of Session    Activity Tolerance: No increased pain Patient left: in bed;with call bell/phone within reach;with bed alarm set (EOB to eat lunch:  pt more comfortable there; )   Time: 7340-3709 OT Time Calculation (min): 21 min Charges:  OT General Charges $OT Visit: 1 Procedure OT Evaluation $Initial OT Evaluation Tier I: 1 Procedure G-Codes:    Cliffton Spradley 2014/11/05, 1:27 PM Lesle Chris, OTR/L [phone] November 05, 2014

## 2014-10-09 NOTE — Progress Notes (Signed)
Patient talking on phone.  Family at bedside.  No s/s of pain or distress.  Room air. Respirations even and unlabored.   IV intact and fluids infusing.

## 2014-10-10 ENCOUNTER — Inpatient Hospital Stay (HOSPITAL_COMMUNITY): Payer: Medicare Other

## 2014-10-10 DIAGNOSIS — G893 Neoplasm related pain (acute) (chronic): Secondary | ICD-10-CM

## 2014-10-10 DIAGNOSIS — Z515 Encounter for palliative care: Secondary | ICD-10-CM

## 2014-10-10 LAB — BASIC METABOLIC PANEL
ANION GAP: 9 (ref 5–15)
BUN: 10 mg/dL (ref 6–20)
CO2: 29 mmol/L (ref 22–32)
Calcium: 8.7 mg/dL — ABNORMAL LOW (ref 8.9–10.3)
Chloride: 95 mmol/L — ABNORMAL LOW (ref 101–111)
Creatinine, Ser: 0.77 mg/dL (ref 0.61–1.24)
GFR calc Af Amer: >60 mL/min (ref >60)
GFR calc non Af Amer: >60 mL/min (ref >60)
Glucose, Bld: 131 mg/dL — ABNORMAL HIGH (ref 65–99)
Potassium: 3.7 mmol/L (ref 3.5–5.1)
SODIUM: 133 mmol/L — AB (ref 135–145)

## 2014-10-10 LAB — CBC
HEMATOCRIT: 26.8 % — AB (ref 39.0–52.0)
HEMOGLOBIN: 8.3 g/dL — AB (ref 13.0–17.0)
MCH: 23.4 pg — ABNORMAL LOW (ref 26.0–34.0)
MCHC: 31 g/dL (ref 30.0–36.0)
MCV: 75.7 fL — ABNORMAL LOW (ref 78.0–100.0)
Platelets: 360 10*3/uL (ref 150–400)
RBC: 3.54 MIL/uL — ABNORMAL LOW (ref 4.22–5.81)
RDW: 19.4 % — ABNORMAL HIGH (ref 11.5–15.5)
WBC: 9.1 10*3/uL (ref 4.0–10.5)

## 2014-10-10 LAB — PROTIME-INR
INR: 1.4 (ref 0.00–1.49)
PROTHROMBIN TIME: 17.3 s — AB (ref 11.6–15.2)

## 2014-10-10 MED ORDER — HYDROMORPHONE HCL 1 MG/ML IJ SOLN
0.5000 mg | INTRAMUSCULAR | Status: DC | PRN
  Administered 2014-10-11 (×2): 0.5 mg via INTRAVENOUS
  Filled 2014-10-10 (×2): qty 1

## 2014-10-10 MED ORDER — ONDANSETRON HCL 4 MG/2ML IJ SOLN
4.0000 mg | Freq: Once | INTRAMUSCULAR | Status: DC
  Filled 2014-10-10: qty 2

## 2014-10-10 MED ORDER — SODIUM CHLORIDE 0.9 % IV SOLN
INTRAVENOUS | Status: DC
  Administered 2014-10-11 – 2014-10-14 (×5): via INTRAVENOUS

## 2014-10-10 MED ORDER — OXYCODONE HCL 5 MG PO TABS
5.0000 mg | ORAL_TABLET | ORAL | Status: DC | PRN
  Administered 2014-10-10 (×2): 5 mg via ORAL
  Administered 2014-10-11 – 2014-10-14 (×13): 10 mg via ORAL
  Filled 2014-10-10 (×6): qty 2
  Filled 2014-10-10: qty 1
  Filled 2014-10-10 (×8): qty 2

## 2014-10-10 NOTE — Consult Note (Signed)
Consultation Note Date: 10/10/2014   Patient Name: Clarence Pollard  DOB: Mar 12, 1941  MRN: [id number]  Age / Sex: 74 y.o., male   PCP: Christain Sacramento, MD Referring Physician: Donne Hazel, MD  Reason for Consultation: Establishing goals of care and Pain control  Palliative Care Assessment and Plan Summary of Established Goals of Care and Medical Treatment Preferences   Clinical Assessment/Narrative: Pt is a 74 yo man with a h/o prostate ca dx in 2014 now with extensive bony metastases admitted after worsening weakness and hallucinations. His calcium was WNL on admission. UA positive for infection. Pt has been on MS04 15mg  q4 prn for pain. He has been taking RX every 4 hours ATC prior to adm with only minimal pain relief. He is no longer going down to dining area because it is too painful to walk. He describes the pain as constant, achy. He states his pain is now 5-6/10 but has usually been an 8/10. He is describing hallucinations as something that happens upon awakening. He jerks, awakens, has a sensation that he is falling. He is vague as to what else he is seeing. He denies any auditory hallucinations. His thought processes are liner goal directed, organized. No paranoid ideation. He is visiting with his daughter and friend. He does not appear to be attending to internal stimuli. Per pt and family he is improved since adm. He is still receiving MS04 but was started on ABX for infection. Mild intermittent myoclonus observed of left UE as well as LE   Contacts/Participants in Discussion: Primary Decision Maker: Pt at this point HCPOA: yes  Daughter, Gabriela Eves  Code Status/Advance Care Planning:  Full Code: Did address this with pt and daughter. Daughter feels he would not want this given his disease progression but pt wished to think about it overnight. Will re-visit on FU 10/10/14  Engage with hospice home care to help with rx mgt and care moving forward. Care mgt consult placed  Symptom  Management:   Pain: Will dc ms04 secondary to side effect concerns. Pt is exhibiting mild myoclonus while I was in the room. Will start Fentanyl TD 12 mcg and dilaudid po 2-4 q2 prn. Will address bone pain with pamidronate infusion and simultaneously start decadron 4mg  daily. Decadron may also help pt's lack of energy. If steroids needs to be tapered would recommend naproxen BID. Monitor and titrate Fentanyl for effect.  Palliative Prophylaxis: laxative  Additional Recommendations (Limitations, Scope, Preferences):  Continued dialogue re: advanced directives Psycho-social/Spiritual:   Support System: yes  Desire for further Chaplaincy support:no  Prognosis: < 6 months  Discharge Planning:  Home with Hospice       Chief Complaint/History of Present Illness: Pt is a 74 yo man with prostate cancer dx in 2014 now with extensive bony mets, admitted due to worsening weakness and hallucinations. He has a UTI, started on ABX and is improved since admission. He is having constant severe pain that is impacting not only quality of life but function.  Primary Diagnoses  Present on Admission:  . Fever . Acute encephalopathy . Hallucinations . HTN (hypertension) . Prostate cancer metastatic to multiple sites  Palliative Review of Systems: Denies n/v. Appetite poor. No dyspnea. Pain is constant, generalized  I have reviewed the medical record, interviewed the patient and family, and examined the patient. The following aspects are pertinent.  Past Medical History  Diagnosis Date  . Ruptured thoracic aortic aneurysm   . Hyperlipidemia   . GERD (gastroesophageal reflux disease)   .  Diverticulosis   . Internal hemorrhoids   . Colon polyps   . HTN (hypertension)   . Cancer     Prostate with bone metastisis   History   Social History  . Marital Status: Married    Spouse Name: N/A  . Number of Children: 2  . Years of Education: N/A   Occupational History  . retired    Social  History Main Topics  . Smoking status: Former Smoker    Types: Cigarettes  . Smokeless tobacco: Former Systems developer    Quit date: 01/19/2011  . Alcohol Use: No  . Drug Use: No  . Sexual Activity: Not on file   Other Topics Concern  . None   Social History Narrative   Family History  Problem Relation Age of Onset  . Colon cancer Mother    Scheduled Meds: . antiseptic oral rinse  7 mL Mouth Rinse q12n4p  . calcium-vitamin D  2 tablet Oral BID  . chlorhexidine  15 mL Mouth Rinse BID  . dexamethasone  4 mg Oral Daily  . enoxaparin (LOVENOX) injection  40 mg Subcutaneous QHS  . feeding supplement (ENSURE ENLIVE)  237 mL Oral BID BM  . fentaNYL  12.5 mcg Transdermal Q72H  . latanoprost  1 drop Both Eyes QHS   Continuous Infusions:  PRN Meds:.acetaminophen **OR** acetaminophen, alum & mag hydroxide-simeth, HYDROmorphone, ondansetron **OR** ondansetron (ZOFRAN) IV Medications Prior to Admission:  Prior to Admission medications   Medication Sig Start Date End Date Taking? Authorizing Provider  brimonidine (ALPHAGAN P) 0.1 % SOLN Place 1 drop into both eyes at bedtime.   Yes Historical Provider, MD  Calcium Citrate-Vitamin D (CALCIUM + D PO) Take 1,200 Units by mouth daily.   Yes Historical Provider, MD  enzalutamide Gillermina Phy) 40 MG capsule Take 160 mg by mouth daily.   Yes Historical Provider, MD  morphine (MSIR) 15 MG tablet Take 15 mg by mouth every 4 (four) hours as needed for moderate pain or severe pain.   Yes Historical Provider, MD  oxycodone (OXY-IR) 5 MG capsule Take 5 mg by mouth every 4 (four) hours as needed for pain (breakthrough pain.).   Yes Historical Provider, MD  travoprost, benzalkonium, (TRAVATAN) 0.004 % ophthalmic solution Place 1 drop into both eyes at bedtime.   Yes Historical Provider, MD  triamcinolone cream (KENALOG) 0.1 % Apply 1 application topically daily as needed (rash).   Yes Historical Provider, MD   No Known Allergies CBC:    Component Value Date/Time    WBC 9.1 10/10/2014 0525   HGB 8.3* 10/10/2014 0525   HCT 26.8* 10/10/2014 0525   PLT 360 10/10/2014 0525   MCV 75.7* 10/10/2014 0525   NEUTROABS 8.7* 10/07/2014 1546   LYMPHSABS 1.6 10/07/2014 1546   MONOABS 1.0 10/07/2014 1546   EOSABS 0.0 10/07/2014 1546   BASOSABS 0.0 10/07/2014 1546   Comprehensive Metabolic Panel:    Component Value Date/Time   NA 133* 10/10/2014 0525   K 3.7 10/10/2014 0525   CL 95* 10/10/2014 0525   CO2 29 10/10/2014 0525   BUN 10 10/10/2014 0525   CREATININE 0.77 10/10/2014 0525   GLUCOSE 131* 10/10/2014 0525   CALCIUM 8.7* 10/10/2014 0525   AST 35 10/07/2014 1546   ALT 10* 10/07/2014 1546   ALKPHOS 1409* 10/07/2014 1546   BILITOT 1.4* 10/07/2014 1546   PROT 8.2* 10/07/2014 1546   ALBUMIN 2.7* 10/07/2014 1546    Physical Exam: Vital Signs: BP 130/72 mmHg  Pulse 90  Temp(Src)  97.9 F (36.6 C) (Oral)  Resp 18  Ht 6' (1.829 m)  Wt 103 kg (227 lb 1.2 oz)  BMI 30.79 kg/m2  SpO2 92% SpO2: SpO2: 92 % O2 Device: O2 Device: Not Delivered O2 Flow Rate:   Intake/output summary:  Intake/Output Summary (Last 24 hours) at 10/10/14 0732 Last data filed at 10/10/14 0557  Gross per 24 hour  Intake    480 ml  Output    600 ml  Net   -120 ml   LBM: Last BM Date: 10/08/14 Baseline Weight: Weight: 103 kg (227 lb 1.2 oz) Most recent weight: Weight: 103 kg (227 lb 1.2 oz)  Exam Findings:  General: Well nourished older man lying in bed. Grimacing occasionally with movement.  Resp: No work of breathing noted Musculoskeletal: Can move all extremities but grimaces with movement Psych: Alert x 4. Thought processes organized. Denies current hallucinations Neuro: Observed intermittent mild jerking of left arm as well as LE         Palliative Performance Scale: 50%              Additional Data Reviewed: Recent Labs     10/09/14  0523  10/10/14  0525  WBC  9.1  9.1  HGB  7.4*  8.3*  PLT  321  360  NA  136  133*  BUN  12  10  CREATININE  0.96  0.77      Time In: 1530 Time Out: 1700 Time Total: 90 min Greater than 50%  of this time was spent counseling and coordinating care related to the above assessment and plan. Discussed with Dr. Hilma Favors  Signed by: Dory Horn, NP  Dory Horn, NP  10/10/2014, 7:32 AM  Please contact Palliative Medicine Team phone at [phone] for questions and concerns.

## 2014-10-10 NOTE — Progress Notes (Signed)
Daily Progress Note   Patient Name: Clarence Pollard       Date: 10/10/2014 DOB: [birth year]  Age: 74 y.o. MRN#: [id number] Attending Physician: Donne Hazel, MD Primary Care Physician: Woody Seller, MD Admit Date: 10/07/2014  Reason for Consultation/Follow-up: Establishing goals of care, Non pain symptom management and Pain control  Subjective: Pt reporting that pain is much improved. He did however get sick about 0400 and vomit which is new for him. PRN zofran given with relief of symptoms. LBM 10/09/14. Calcium level WNL.This am during my assessment he was also verbalizing feeling nauseated. He had just received dilaudid PO approx 1 hour prior. He did not eat much breakfast. States it was not because of nausea, but poor appetite. He is a little sleepy from medication but able to participate in assessment. He is also reporting leg "spasms" which per daughter he means cramps. Takes potassium. Vit d, calcium and magnesium at home to help with this.He is denying hallucinations. Care mgt consult placed for hospice referral in place Interval Events: DC MS04, Fentanyl TD 12 mcg, dilaudid po 2-4mg  q2 prn. Pamidronate infusion, decadron Length of Stay: 2 days  Current Medications: Scheduled Meds:  . antiseptic oral rinse  7 mL Mouth Rinse q12n4p  . calcium-vitamin D  2 tablet Oral BID  . chlorhexidine  15 mL Mouth Rinse BID  . dexamethasone  4 mg Oral Daily  . enoxaparin (LOVENOX) injection  40 mg Subcutaneous QHS  . feeding supplement (ENSURE ENLIVE)  237 mL Oral BID BM  . fentaNYL  12.5 mcg Transdermal Q72H  . latanoprost  1 drop Both Eyes QHS  . ondansetron (ZOFRAN) IV  4 mg Intravenous Once    Continuous Infusions:    PRN Meds: acetaminophen **OR** acetaminophen, alum & mag hydroxide-simeth, HYDROmorphone (DILAUDID) injection, ondansetron **OR** ondansetron (ZOFRAN) IV, oxyCODONE  Palliative Performance Scale: 50%     Vital Signs: BP 130/72 mmHg  Pulse 90  Temp(Src) 97.9 F  (36.6 C) (Oral)  Resp 18  Ht 6' (1.829 m)  Wt 103 kg (227 lb 1.2 oz)  BMI 30.79 kg/m2  SpO2 92% SpO2: SpO2: 92 % O2 Device: O2 Device: Not Delivered O2 Flow Rate:    Intake/output summary:  Intake/Output Summary (Last 24 hours) at 10/10/14 1309 Last data filed at 10/10/14 0557  Gross per 24 hour  Intake    240 ml  Output    600 ml  Net   -360 ml   LBM:   Baseline Weight: Weight: 103 kg (227 lb 1.2 oz) Most recent weight: Weight: 103 kg (227 lb 1.2 oz)  Physical Exam: General: Older man, somnolent, lying in the bed. Very pale Resp: No work of breathing noted Neuro: Mild myoclonus observed to LUE Psych: Somnolent but able to answer questions. Thought processes organized. Does not appear to be attending to internal stimuli              Additional Data Reviewed: Recent Labs     10/09/14  0523  10/10/14  0525  WBC  9.1  9.1  HGB  7.4*  8.3*  PLT  321  360  NA  136  133*  BUN  12  10  CREATININE  0.96  0.77     Problem List:  Patient Active Problem List   Diagnosis Date Noted  . Cancer related pain   . Palliative care encounter   . Elevated alkaline phosphatase level   . Pyrexia   . Hallucinations 10/08/2014  . HTN (  hypertension) 10/08/2014  . Weakness generalized 10/08/2014  . Prostate cancer metastatic to multiple sites 10/08/2014  . Malnutrition of moderate degree 10/08/2014  . Fever 10/07/2014  . Acute encephalopathy 10/07/2014     Palliative Care Assessment & Plan    Code Status:  DNR  Goals of Care:  Home with in-home hospice help  Desire for further Chaplaincy support:no  3. Symptom Management:  Pain: Improving. Continue Fentanyl TD 12 mcg q 72 hr. Monitor and titrate for effect. Cont decadron 4mg  daily for bone pain. Will dc po dilaudid in case this is causing nausea although MS04 more likely to contribute to nausea. Cont to offer dilaudid iv for break thru pain if nausea persists but will resume oxycodone 5-10 mg q3 prn for break thru  pain. Pt was on this prior without SE of nausea. Cont Zofran PRN. Will add magnesium for leg cramps  4. Palliative Prophylaxis:  Stool Softner: *yes  5. Prognosis: < 6 months  5. Discharge Planning: Home with Hospice   Care plan was discussed with Dr. Hilma Favors and Dr. Wyline Copas  Thank you for allowing the Palliative Medicine Team to assist in the care of this patient.   Time In: 1100 Time Out: 1145 Total Time 45 Prolonged Time Billed  no     Greater than 50%  of this time was spent counseling and coordinating care related to the above assessment and plan.   Dory Horn, NP  10/10/2014, 1:09 PM  Please contact Palliative Medicine Team phone at [phone] for questions and concerns.

## 2014-10-10 NOTE — Progress Notes (Signed)
[facility] PROGRESS NOTE  Clarence Pollard LNZ:[id number] DOB: [birth year] DOA: 10/07/2014 PCP: Woody Seller, MD  Assessment/Plan: 1. Acute encephalopathy/Hallucinations 1. Much improved since this admission 2. Suspected possible polypharmacy as pt reports taking both PO morphine and oxycodone PRN prior to admit 3. Also possibly related to metastatic disease given widespread cancer spread 4. Appreciate Palliative Care input 2. Low grade fevers 1. Afebrile since admit 2. Blood cx neg 3. CXR without signs of PNA 4. Pt was started on empiric broad spectrum abx on admit 5. Question if low grade temps were significant 6. Holding abx at this time and cont to monitor for fevers 3. Weakness 1. Consult PT/OT 4. HTN 1. BP stable, controlled 5. Hx metastatic prostate cancer 1. Numerous bone mets on cxr 2. Pt noted to be on Xtandi 3. Discussed with Oncology (Dr. Alen Blew) and appreciate input 4. Overall poor prognosis with limited tolerability to treatments. As such, recommendation for hospice 5. Have consulted Palliative Care for assistance with pain management as well as for end of life issues/goals of care 6. DVT prophylaxis 1. Lovenox subQ while inpatient 7. SBO 1. Keep NPO with basal IVF  Code Status: Full Family Communication: Pt in room, daughter over phone Disposition Plan: Pending   Consultants:    Procedures:    Antibiotics:  Vancomycin 7/20>>>  Zosyn 7/20>>>  HPI/Subjective: Good spirits today. NO abd pain. No bm today  Objective: Filed Vitals:   10/09/14 2044 10/10/14 0231 10/10/14 0443 10/10/14 1508  BP: 96/48 124/67 130/72 110/58  Pulse: 95 84 90 86  Temp: 98.7 F (37.1 C) 97.8 F (36.6 C) 97.9 F (36.6 C) 98.1 F (36.7 C)  TempSrc: Oral Oral Oral Oral  Resp: 18 20 18 18   Height:      Weight:      SpO2: 93% 96% 92% 95%    Intake/Output Summary (Last 24 hours) at 10/10/14 1700 Last data filed at 10/10/14 1330  Gross per 24 hour  Intake     480 ml  Output    900 ml  Net   -420 ml   Filed Weights   10/07/14 2143  Weight: 103 kg (227 lb 1.2 oz)    Exam:   General:  Awake, laying in bed, in nad  Cardiovascular: regular, s1, s2  Respiratory: normal resp effort, no wheezing  Abdomen: soft, mildly distended, pos BS  Musculoskeletal: perfused, no clubbing, no cyanosis  Data Reviewed: Basic Metabolic Panel:  Recent Labs Lab 10/07/14 1546 10/08/14 0510 10/09/14 0523 10/10/14 0525  NA 135 132* 136 133*  K 4.1 3.3* 3.2* 3.7  CL 94* 98* 100* 95*  CO2 29 25 27 29   GLUCOSE 110* 110* 102* 131*  BUN 15 15 12 10   CREATININE 0.98 0.98 0.96 0.77  CALCIUM 9.3 8.0* 8.2* 8.7*   Liver Function Tests:  Recent Labs Lab 10/07/14 1546  AST 35  ALT 10*  ALKPHOS 1409*  BILITOT 1.4*  PROT 8.2*  ALBUMIN 2.7*   No results for input(s): LIPASE, AMYLASE in the last 168 hours. No results for input(s): AMMONIA in the last 168 hours. CBC:  Recent Labs Lab 10/07/14 1546 10/08/14 0510 10/08/14 1417 10/09/14 0523 10/10/14 0525  WBC 11.3* 9.4  --  9.1 9.1  NEUTROABS 8.7*  --   --   --   --   HGB 8.7* 7.7* 7.5* 7.4* 8.3*  HCT 28.5* 25.2* 24.2* 23.5* 26.8*  MCV 76.6* 75.4*  --  75.8* 75.7*  PLT 361 273  --  321 360   Cardiac Enzymes: No results for input(s): CKTOTAL, CKMB, CKMBINDEX, TROPONINI in the last 168 hours. BNP (last 3 results) No results for input(s): BNP in the last 8760 hours.  ProBNP (last 3 results) No results for input(s): PROBNP in the last 8760 hours.  CBG: No results for input(s): GLUCAP in the last 168 hours.  Recent Results (from the past 240 hour(s))  Culture, blood (routine x 2)     Status: None (Preliminary result)   Collection Time: 10/07/14  4:20 PM  Result Value Ref Range Status   Specimen Description BLOOD LEFT ANTECUBITAL  Final   Special Requests BOTTLES DRAWN AEROBIC AND ANAEROBIC 5 CC EA  Final   Culture   Final    NO GROWTH 3 DAYS Performed at [facility]     Report Status PENDING  Incomplete  Culture, blood (routine x 2)     Status: None (Preliminary result)   Collection Time: 10/07/14  4:20 PM  Result Value Ref Range Status   Specimen Description BLOOD RIGHT ANTECUBITAL  Final   Special Requests BOTTLES DRAWN AEROBIC AND ANAEROBIC 5 CC EA  Final   Culture   Final    NO GROWTH 3 DAYS Performed at [facility]    Report Status PENDING  Incomplete     Studies: Dg Abd Portable 1v  10/10/2014   CLINICAL DATA:  Acute onset generalized abdominal pain. Current history of metastatic prostate cancer.  EXAM: PORTABLE ABDOMEN - 1 VIEW  COMPARISON:  None.  FINDINGS: Moderate distention of multiple loops of jejunum in the left upper quadrant. Mild gaseous distention of the visualized stomach. Gas within normal caliber colon. No suggestion of free air on the supine image. Surgical clips in the right mid abdomen. Surgical clips in the right upper quadrant from prior cholecystectomy.  Widespread sclerotic osseous metastatic disease throughout the visualized skeleton.  IMPRESSION: Early partial small bowel obstruction versus localized ileus involving the jejunum. Followup abdominal x-rays are suggested until resolution.   Electronically Signed   By: Evangeline Dakin M.D.   On: 10/10/2014 14:48    Scheduled Meds: . antiseptic oral rinse  7 mL Mouth Rinse q12n4p  . calcium-vitamin D  2 tablet Oral BID  . chlorhexidine  15 mL Mouth Rinse BID  . dexamethasone  4 mg Oral Daily  . enoxaparin (LOVENOX) injection  40 mg Subcutaneous QHS  . feeding supplement (ENSURE ENLIVE)  237 mL Oral BID BM  . fentaNYL  12.5 mcg Transdermal Q72H  . latanoprost  1 drop Both Eyes QHS  . ondansetron (ZOFRAN) IV  4 mg Intravenous Once   Continuous Infusions: . sodium chloride 75 mL/hr at 10/10/14 1658    Principal Problem:   Acute encephalopathy Active Problems:   Fever   Hallucinations   HTN (hypertension)   Weakness generalized   Prostate cancer metastatic to  multiple sites   Malnutrition of moderate degree   Elevated alkaline phosphatase level   Pyrexia   Cancer related pain   Palliative care encounter   [facility], [facility] Pager [phone]. If 7PM-7AM, please contact night-coverage at [email or web address], password [facility] 10/10/2014, 5:00 PM  LOS: 2 days

## 2014-10-11 DIAGNOSIS — Z515 Encounter for palliative care: Secondary | ICD-10-CM

## 2014-10-11 DIAGNOSIS — K567 Ileus, unspecified: Secondary | ICD-10-CM

## 2014-10-11 DIAGNOSIS — K5909 Other constipation: Secondary | ICD-10-CM

## 2014-10-11 LAB — PROTIME-INR
INR: 1.43 (ref 0.00–1.49)
PROTHROMBIN TIME: 17.6 s — AB (ref 11.6–15.2)

## 2014-10-11 MED ORDER — SENNOSIDES-DOCUSATE SODIUM 8.6-50 MG PO TABS
2.0000 | ORAL_TABLET | Freq: Every day | ORAL | Status: DC
  Administered 2014-10-11 – 2014-10-13 (×3): 2 via ORAL
  Filled 2014-10-11 (×4): qty 2

## 2014-10-11 MED ORDER — METOCLOPRAMIDE HCL 5 MG PO TABS
5.0000 mg | ORAL_TABLET | Freq: Three times a day (TID) | ORAL | Status: DC
  Administered 2014-10-11 – 2014-10-14 (×11): 5 mg via ORAL
  Filled 2014-10-11 (×12): qty 1

## 2014-10-11 NOTE — Progress Notes (Signed)
Daily Progress Note   Patient Name: Clarence Pollard       Date: 10/11/2014 DOB: Oct 14, 1940  Age: 74 y.o. MRN#: [id number] Attending Physician: Donne Hazel, MD Primary Care Physician: Woody Seller, MD Admit Date: 10/07/2014  Reason for Consultation/Follow-up: Pain control  Subjective: Pt is a 74 yo man with prostate cancer with extensive metastatic disease to bone. He report pain is much improved. His affect is brighter, more alert. Yesterday he did c/o nausea and did vomit 1 time. Abd film done and early partial bowel obstruction present. He has used 3 prn oxycodone and has had no further nausea and vomiting. He is denying abd pain currently. He did get OOB to recliner with assistance yesterday. Said he felt a little weak but no pain. Denies hallucinations. Having vivid dreams Interval Events: Abd film reflecting early bowel obstruction. Length of Stay: 3 days  Current Medications: Scheduled Meds:  . antiseptic oral rinse  7 mL Mouth Rinse q12n4p  . calcium-vitamin D  2 tablet Oral BID  . chlorhexidine  15 mL Mouth Rinse BID  . dexamethasone  4 mg Oral Daily  . enoxaparin (LOVENOX) injection  40 mg Subcutaneous QHS  . feeding supplement (ENSURE ENLIVE)  237 mL Oral BID BM  . fentaNYL  12.5 mcg Transdermal Q72H  . latanoprost  1 drop Both Eyes QHS  . metoCLOPramide  5 mg Oral TID AC  . ondansetron (ZOFRAN) IV  4 mg Intravenous Once  . senna-docusate  2 tablet Oral QHS    Continuous Infusions: . sodium chloride 75 mL/hr at 10/11/14 1357    PRN Meds: acetaminophen **OR** acetaminophen, alum & mag hydroxide-simeth, HYDROmorphone (DILAUDID) injection, ondansetron **OR** ondansetron (ZOFRAN) IV, oxyCODONE  Palliative Performance Scale: 50%     Vital Signs: BP 126/73 mmHg  Pulse 79  Temp(Src) 97.5 F (36.4 C) (Oral)  Resp 18  Ht 6' (1.829 m)  Wt 103 kg (227 lb 1.2 oz)  BMI 30.79 kg/m2  SpO2 97% SpO2: SpO2: 97 % O2 Device: O2 Device: Not Delivered O2 Flow Rate:      Intake/output summary:  Intake/Output Summary (Last 24 hours) at 10/11/14 1513 Last data filed at 10/11/14 1401  Gross per 24 hour  Intake      0 ml  Output    750 ml  Net   -750 ml   LBM:   Baseline Weight: Weight: 103 kg (227 lb 1.2 oz) Most recent weight: Weight: 103 kg (227 lb 1.2 oz)  Physical Exam: General: Well nourished older man. Alert, no acute distress Resp: Clear. No work of breathing Cardiac: RRR Psych: Affect brighter. More spontaneous and verbal. He is denying hallucination. Neuro: No myoclonus observed              Additional Data Reviewed: Recent Labs     10/09/14  0523  10/10/14  0525  WBC  9.1  9.1  HGB  7.4*  8.3*  PLT  321  360  NA  136  133*  BUN  12  10  CREATININE  0.96  0.77     Problem List:  Patient Active Problem List   Diagnosis Date Noted  . Cancer related pain   . Palliative care encounter   . Elevated alkaline phosphatase level   . Pyrexia   . Hallucinations 10/08/2014  . HTN (hypertension) 10/08/2014  . Weakness generalized 10/08/2014  . Prostate cancer metastatic to multiple sites 10/08/2014  . Malnutrition of moderate degree 10/08/2014  . Fever 10/07/2014  .  Acute encephalopathy 10/07/2014     Palliative Care Assessment & Plan    Code Status:  DNR  Goals of Care:  Home with hospice when medically ready  Desire for further Chaplaincy support:no  3. Symptom Management:  Pain: Improving. Continue Fentanyl 12 mcg TD q 72 with oxycodone 5-10 mg q2 prn for pain. Continue decadron 4mg  daily for bone pain  Constipation: Start senna 2 tabs qhs  Partial small bowel obstruction vs. Ileus: Start Reglan 5mg  PO ac and continue decadron 4mg  daily as well as starting scheduled bowel regimin  4. Palliative Prophylaxis:  Stool Softner: senna 2 tabs qhs  5. Prognosis: < 6 months  5. Discharge Planning: Home with Hospice   Care plan was discussed with Dr. Wyline Copas  Thank you for allowing the Palliative Medicine Team to  assist in the care of this patient.   Time In: 0900 Time Out: 0930 Total Time 30 Prolonged Time Billed  no     Greater than 50%  of this time was spent counseling and coordinating care related to the above assessment and plan.   Dory Horn, NP  10/11/2014, 3:13 PM  Please contact Palliative Medicine Team phone at [phone] for questions and concerns.

## 2014-10-11 NOTE — Progress Notes (Signed)
[facility] PROGRESS NOTE  Clarence Pollard SWH:[id number] DOB: 03-27-1940 DOA: 10/07/2014 PCP: Woody Seller, MD  Assessment/Plan: 1. Acute encephalopathy/Hallucinations 1. Much improved since this admission 2. Suspected possible polypharmacy as pt reports taking both PO morphine and oxycodone PRN prior to admit 3. Also possibly related to metastatic disease given widespread cancer spread 4. Appreciate Palliative Care recommendations 2. Low grade fevers 1. Noted to be afebrile since admit 2. Blood cx neg 3. CXR without signs of PNA 4. Pt was started on empiric broad spectrum abx on admit 5. Question if low grade temps were significant or accurately obtained 6. Holding abx at this time and cont to monitor for fevers 3. Weakness 1. Consulted PT/OT 4. HTN 1. BP stable, controlled 5. Hx metastatic prostate cancer 1. Numerous bone mets on cxr 2. Pt noted to be on Xtandi 3. Discussed with Oncology (Dr. Alen Blew) and appreciate input 4. Overall poor prognosis with limited tolerability to treatments. As such, recommendation for hospice 5. Have consulted Palliative Care for assistance with pain management as well as for end of life issues/goals of care. Appreciate input 6. DVT prophylaxis 1. Lovenox subQ while inpatient 7. SBO 1. Pt remains NPO except ice chips 2. Reports no flatus this afternoon 3. Will repeat abd xray in AM for interval change 4. If pt becomes symptomatic, would consider NG tube to LWS at that time 5. Palliative Care has started scheduled reglan with steroids  Code Status: Full Family Communication: Pt in room, daughter over phone Disposition Plan: Pending   Consultants:    Procedures:    Antibiotics:  Vancomycin 7/20>>>  Zosyn 7/20>>>  HPI/Subjective: Reports feeling "full." Denies nausea or abd pain  Objective: Filed Vitals:   10/10/14 1508 10/10/14 2151 10/11/14 0552 10/11/14 1534  BP: 110/58 117/67 126/73 129/66  Pulse: 86 78 79 83   Temp: 98.1 F (36.7 C) 97.8 F (36.6 C) 97.5 F (36.4 C) 98.7 F (37.1 C)  TempSrc: Oral Oral Oral Oral  Resp: 18 18 18 18   Height:      Weight:      SpO2: 95% 90% 97% 91%    Intake/Output Summary (Last 24 hours) at 10/11/14 1629 Last data filed at 10/11/14 1401  Gross per 24 hour  Intake      0 ml  Output    750 ml  Net   -750 ml   Filed Weights   10/07/14 2143  Weight: 103 kg (227 lb 1.2 oz)    Exam:   General:  Awake, laying in bed, in nad  Cardiovascular: regular, s1, s2  Respiratory: normal resp effort, no wheezing  Abdomen: soft, distended, decreased BS  Musculoskeletal: perfused, no cyanosis  Data Reviewed: Basic Metabolic Panel:  Recent Labs Lab 10/07/14 1546 10/08/14 0510 10/09/14 0523 10/10/14 0525  NA 135 132* 136 133*  K 4.1 3.3* 3.2* 3.7  CL 94* 98* 100* 95*  CO2 29 25 27 29   GLUCOSE 110* 110* 102* 131*  BUN 15 15 12 10   CREATININE 0.98 0.98 0.96 0.77  CALCIUM 9.3 8.0* 8.2* 8.7*   Liver Function Tests:  Recent Labs Lab 10/07/14 1546  AST 35  ALT 10*  ALKPHOS 1409*  BILITOT 1.4*  PROT 8.2*  ALBUMIN 2.7*   No results for input(s): LIPASE, AMYLASE in the last 168 hours. No results for input(s): AMMONIA in the last 168 hours. CBC:  Recent Labs Lab 10/07/14 1546 10/08/14 0510 10/08/14 1417 10/09/14 0523 10/10/14 0525  WBC 11.3* 9.4  --  9.1  9.1  NEUTROABS 8.7*  --   --   --   --   HGB 8.7* 7.7* 7.5* 7.4* 8.3*  HCT 28.5* 25.2* 24.2* 23.5* 26.8*  MCV 76.6* 75.4*  --  75.8* 75.7*  PLT 361 273  --  321 360   Cardiac Enzymes: No results for input(s): CKTOTAL, CKMB, CKMBINDEX, TROPONINI in the last 168 hours. BNP (last 3 results) No results for input(s): BNP in the last 8760 hours.  ProBNP (last 3 results) No results for input(s): PROBNP in the last 8760 hours.  CBG: No results for input(s): GLUCAP in the last 168 hours.  Recent Results (from the past 240 hour(s))  Culture, blood (routine x 2)     Status: None  (Preliminary result)   Collection Time: 10/07/14  4:20 PM  Result Value Ref Range Status   Specimen Description BLOOD LEFT ANTECUBITAL  Final   Special Requests BOTTLES DRAWN AEROBIC AND ANAEROBIC 5 CC EA  Final   Culture   Final    NO GROWTH 4 DAYS Performed at [facility]    Report Status PENDING  Incomplete  Culture, blood (routine x 2)     Status: None (Preliminary result)   Collection Time: 10/07/14  4:20 PM  Result Value Ref Range Status   Specimen Description BLOOD RIGHT ANTECUBITAL  Final   Special Requests BOTTLES DRAWN AEROBIC AND ANAEROBIC 5 CC EA  Final   Culture   Final    NO GROWTH 4 DAYS Performed at [facility]    Report Status PENDING  Incomplete     Studies: Dg Abd Portable 1v  10/10/2014   CLINICAL DATA:  Acute onset generalized abdominal pain. Current history of metastatic prostate cancer.  EXAM: PORTABLE ABDOMEN - 1 VIEW  COMPARISON:  None.  FINDINGS: Moderate distention of multiple loops of jejunum in the left upper quadrant. Mild gaseous distention of the visualized stomach. Gas within normal caliber colon. No suggestion of free air on the supine image. Surgical clips in the right mid abdomen. Surgical clips in the right upper quadrant from prior cholecystectomy.  Widespread sclerotic osseous metastatic disease throughout the visualized skeleton.  IMPRESSION: Early partial small bowel obstruction versus localized ileus involving the jejunum. Followup abdominal x-rays are suggested until resolution.   Electronically Signed   By: Evangeline Dakin M.D.   On: 10/10/2014 14:48    Scheduled Meds: . antiseptic oral rinse  7 mL Mouth Rinse q12n4p  . calcium-vitamin D  2 tablet Oral BID  . chlorhexidine  15 mL Mouth Rinse BID  . dexamethasone  4 mg Oral Daily  . enoxaparin (LOVENOX) injection  40 mg Subcutaneous QHS  . feeding supplement (ENSURE ENLIVE)  237 mL Oral BID BM  . fentaNYL  12.5 mcg Transdermal Q72H  . latanoprost  1 drop Both Eyes QHS  .  metoCLOPramide  5 mg Oral TID AC  . ondansetron (ZOFRAN) IV  4 mg Intravenous Once  . senna-docusate  2 tablet Oral QHS   Continuous Infusions: . sodium chloride 75 mL/hr at 10/11/14 1357    Principal Problem:   Acute encephalopathy Active Problems:   Fever   Hallucinations   HTN (hypertension)   Weakness generalized   Prostate cancer metastatic to multiple sites   Malnutrition of moderate degree   Elevated alkaline phosphatase level   Pyrexia   Cancer related pain   Palliative care encounter   Constipated   Gwynne Kemnitz, [facility] Pager [phone]. If 7PM-7AM, please contact  night-coverage at [email or web address], password [facility] 10/11/2014, 4:29 PM  LOS: 3 days

## 2014-10-12 ENCOUNTER — Inpatient Hospital Stay (HOSPITAL_COMMUNITY): Payer: Medicare Other

## 2014-10-12 DIAGNOSIS — K56609 Unspecified intestinal obstruction, unspecified as to partial versus complete obstruction: Secondary | ICD-10-CM

## 2014-10-12 DIAGNOSIS — K5669 Other intestinal obstruction: Secondary | ICD-10-CM

## 2014-10-12 LAB — CULTURE, BLOOD (ROUTINE X 2)
Culture: NO GROWTH
Culture: NO GROWTH

## 2014-10-12 LAB — PROTIME-INR
INR: 1.25 (ref 0.00–1.49)
PROTHROMBIN TIME: 15.9 s — AB (ref 11.6–15.2)

## 2014-10-12 MED ORDER — HYDROMORPHONE HCL 1 MG/ML IJ SOLN
0.5000 mg | INTRAMUSCULAR | Status: DC | PRN
  Administered 2014-10-12 – 2014-10-14 (×5): 1 mg via INTRAVENOUS
  Filled 2014-10-12 (×5): qty 1

## 2014-10-12 MED ORDER — CYCLOBENZAPRINE HCL 5 MG PO TABS
5.0000 mg | ORAL_TABLET | Freq: Three times a day (TID) | ORAL | Status: DC | PRN
  Administered 2014-10-12 – 2014-10-14 (×6): 5 mg via ORAL
  Filled 2014-10-12 (×6): qty 1

## 2014-10-12 MED ORDER — HYDROMORPHONE HCL 1 MG/ML IJ SOLN
1.0000 mg | Freq: Once | INTRAMUSCULAR | Status: AC
  Administered 2014-10-12: 1 mg via INTRAVENOUS
  Filled 2014-10-12: qty 1

## 2014-10-12 NOTE — Progress Notes (Signed)
[facility] PROGRESS NOTE  Clarence Pollard GYB:[id number] DOB: July 09, 1940 DOA: 10/07/2014 PCP: Woody Seller, MD  Assessment/Plan: 1. Acute encephalopathy/Hallucinations 1. Much improved since this admission, seems at baseline 2. Suspected polypharmacy as pt reports taking both PO morphine and oxycodone PRN prior to admit 3. Also possibly related to metastatic disease given widespread cancer spread 4. Appreciate Palliative Care recommendations 2. Low grade fevers 1. Noted to be afebrile since admit 2. Blood cx neg 3. CXR without signs of PNA 4. Pt was started on empiric broad spectrum abx on admit 5. Question if low grade temps were significant or accurately obtained 6. Holding abx and cont to monitor for fevers 3. Weakness 1. Consulted PT/OT 4. HTN 1. BP stable, controlled 5. Hx metastatic prostate cancer 1. Numerous bone mets on cxr 2. Pt noted to be on Xtandi 3. Oncology (Dr. Alen Blew) following and appreciate input 4. Overall poor prognosis with limited tolerability to treatments. As such, recommendation for hospice 5. Have consulted Palliative Care for assistance with pain management as well as for end of life issues/goals of care. Appreciate input 6. Plans for hospice 6. DVT prophylaxis 1. Lovenox subQ while inpatient 7. SBO 1. Seems to be resolving 2. Pos flatus and good bowels sounds 3. Starting clears and advance diet as tolerated Code Status: Full Family Communication: Pt in room, daughter over phone Disposition Plan: Pending   Consultants:  Oncology  Palliative Care  Procedures:    Antibiotics:  Vancomycin 7/20>>>7/20  Zosyn 7/20>>>7/20  HPI/Subjective: Eager to start eating again. No abd pain. Multiple bouts of flatus  Objective: Filed Vitals:   10/11/14 1534 10/11/14 2111 10/12/14 0553 10/12/14 1356  BP: 129/66 132/68 157/91 134/64  Pulse: 83 79 88 79  Temp: 98.7 F (37.1 C) 98.2 F (36.8 C) 98.4 F (36.9 C) 98.1 F (36.7 C)   TempSrc: Oral Oral Oral Oral  Resp: 18 20 18 20   Height:      Weight:      SpO2: 91% 93% 96% 97%    Intake/Output Summary (Last 24 hours) at 10/12/14 1637 Last data filed at 10/12/14 1339  Gross per 24 hour  Intake 1743.75 ml  Output    850 ml  Net 893.75 ml   Filed Weights   10/07/14 2143  Weight: 103 kg (227 lb 1.2 oz)    Exam:   General:  Awake, laying in bed, in nad  Cardiovascular: regular, s1, s2  Respiratory: normal resp effort, no wheezing  Abdomen: soft, less distended, pos BS  Musculoskeletal: perfused, no cyanosis  Data Reviewed: Basic Metabolic Panel:  Recent Labs Lab 10/07/14 1546 10/08/14 0510 10/09/14 0523 10/10/14 0525  NA 135 132* 136 133*  K 4.1 3.3* 3.2* 3.7  CL 94* 98* 100* 95*  CO2 29 25 27 29   GLUCOSE 110* 110* 102* 131*  BUN 15 15 12 10   CREATININE 0.98 0.98 0.96 0.77  CALCIUM 9.3 8.0* 8.2* 8.7*   Liver Function Tests:  Recent Labs Lab 10/07/14 1546  AST 35  ALT 10*  ALKPHOS 1409*  BILITOT 1.4*  PROT 8.2*  ALBUMIN 2.7*   No results for input(s): LIPASE, AMYLASE in the last 168 hours. No results for input(s): AMMONIA in the last 168 hours. CBC:  Recent Labs Lab 10/07/14 1546 10/08/14 0510 10/08/14 1417 10/09/14 0523 10/10/14 0525  WBC 11.3* 9.4  --  9.1 9.1  NEUTROABS 8.7*  --   --   --   --   HGB 8.7* 7.7* 7.5* 7.4* 8.3*  HCT 28.5* 25.2* 24.2* 23.5* 26.8*  MCV 76.6* 75.4*  --  75.8* 75.7*  PLT 361 273  --  321 360   Cardiac Enzymes: No results for input(s): CKTOTAL, CKMB, CKMBINDEX, TROPONINI in the last 168 hours. BNP (last 3 results) No results for input(s): BNP in the last 8760 hours.  ProBNP (last 3 results) No results for input(s): PROBNP in the last 8760 hours.  CBG: No results for input(s): GLUCAP in the last 168 hours.  Recent Results (from the past 240 hour(s))  Culture, blood (routine x 2)     Status: None   Collection Time: 10/07/14  4:20 PM  Result Value Ref Range Status   Specimen  Description BLOOD LEFT ANTECUBITAL  Final   Special Requests BOTTLES DRAWN AEROBIC AND ANAEROBIC 5 CC EA  Final   Culture   Final    NO GROWTH 5 DAYS Performed at [facility]    Report Status 10/12/2014 FINAL  Final  Culture, blood (routine x 2)     Status: None   Collection Time: 10/07/14  4:20 PM  Result Value Ref Range Status   Specimen Description BLOOD RIGHT ANTECUBITAL  Final   Special Requests BOTTLES DRAWN AEROBIC AND ANAEROBIC 5 CC EA  Final   Culture   Final    NO GROWTH 5 DAYS Performed at [facility]    Report Status 10/12/2014 FINAL  Final     Studies: Dg Abd Portable 1v  10/12/2014   CLINICAL DATA:  Follow-up ileus, history of diverticulosis and previous inguinal hernia repair  EXAM: PORTABLE ABDOMEN - 1 VIEW  COMPARISON:  Supine abdominal film of October 10, 2014  FINDINGS: There remain loops of moderately distended gas-filled small bowel in the left mid abdomen. There is gaseous distention of normal caliber: Diffusely. There is decreased gas in the stomach. There is some stool and a small amount of gas in the rectum. There are widespread sclerotic bony metastases. There surgical clips in the gallbladder fossa.  IMPRESSION: Persistent diffuse ileus with mild interval improvement.   Electronically Signed   By: David  Martinique M.D.   On: 10/12/2014 07:21    Scheduled Meds: . antiseptic oral rinse  7 mL Mouth Rinse q12n4p  . calcium-vitamin D  2 tablet Oral BID  . chlorhexidine  15 mL Mouth Rinse BID  . dexamethasone  4 mg Oral Daily  . enoxaparin (LOVENOX) injection  40 mg Subcutaneous QHS  . feeding supplement (ENSURE ENLIVE)  237 mL Oral BID BM  . fentaNYL  12.5 mcg Transdermal Q72H  . latanoprost  1 drop Both Eyes QHS  . metoCLOPramide  5 mg Oral TID AC  . ondansetron (ZOFRAN) IV  4 mg Intravenous Once  . senna-docusate  2 tablet Oral QHS   Continuous Infusions: . sodium chloride 75 mL/hr at 10/12/14 0600    Principal Problem:   Acute  encephalopathy Active Problems:   Fever   Hallucinations   HTN (hypertension)   Weakness generalized   Prostate cancer metastatic to multiple sites   Malnutrition of moderate degree   Elevated alkaline phosphatase level   Pyrexia   Cancer related pain   Palliative care encounter   Constipated   Ileus   Clarence Pollard, [facility] Pager [phone]. If 7PM-7AM, please contact night-coverage at [email or web address], password [facility] 10/12/2014, 4:37 PM  LOS: 4 days

## 2014-10-12 NOTE — Progress Notes (Signed)
PT Cancellation Note  Patient Details Name: Clarence Pollard MRN: [id number] DOB: [birth year]   Cancelled Treatment:     Pt requested to rest.  Stated he just worked with that "other purple lady" (OT).   Nathanial Rancher 10/12/2014, 3:36 PM

## 2014-10-12 NOTE — Progress Notes (Signed)
IP PROGRESS NOTE  Subjective:   Clarence Pollard is comfortable this morning although he had some pain issues overnight. He reports he had passed gas but no bowel movements yet. He is not reporting any nausea or vomiting.  Objective:  Vital signs in last 24 hours: Temp:  [98.2 F (36.8 C)-98.7 F (37.1 C)] 98.4 F (36.9 C) (07/25 0553) Pulse Rate:  [79-88] 88 (07/25 0553) Resp:  [18-20] 18 (07/25 0553) BP: (129-157)/(66-91) 157/91 mmHg (07/25 0553) SpO2:  [91 %-96 %] 96 % (07/25 0553) Weight change:  Last BM Date: 10/08/14  Intake/Output from previous day: 07/24 0701 - 07/25 0700 In: 2777.5 [I.V.:2777.5] Out: 650 [Urine:650] Alert gentleman appeared in no distress today. Mouth: mucous membranes moist, pharynx normal without lesions Resp: clear to auscultation bilaterally Cardio: regular rate and rhythm, S1, S2 normal, no murmur, click, rub or gallop GI: soft, non-tender; bowel sounds normal; no masses,  no organomegaly Extremities: extremities normal, atraumatic, no cyanosis or edema    Lab Results:  Recent Labs  10/10/14 0525  WBC 9.1  HGB 8.3*  HCT 26.8*  PLT 360    BMET  Recent Labs  10/10/14 0525  NA 133*  K 3.7  CL 95*  CO2 29  GLUCOSE 131*  BUN 10  CREATININE 0.77  CALCIUM 8.7*   Results for HYUN, MARSALIS (MRN [id number]) as of 10/09/2014 14:56  Ref. Range 09/01/2014 11:41 10/06/2014 09:52  PSA Latest Ref Range: <=4.00 ng/mL 94.41 (H) 157.20 (H)         Medications: I have reviewed the patient's current medications.  Assessment/Plan:  74 year old gentleman with the following issues:  1. Metastatic prostate cancer with extensive disease to the bone. Most recently, he has progressed rapidly as evidenced by his  bone scan and PSA. These findings were discussed also with his daughter and updated about his situation. I feel he is a poor candidate for any aggressive therapy such as chemotherapy. I think chemotherapy would offer little palliation  with very narrow therapeutic index. I recommended supportive and hospice care for the patient and reiterated that to his daughter.  2. Pain control: He was to be reasonably controlled at this time and appreciate input from the palliative medicine service regarding this issue.  3. Prognosis: Is rather poor with limited life expectancy given the extensive widespread cancer and poor performance status.    LOS: 4 days   Clarence Pollard 10/12/2014, 8:12 AM

## 2014-10-12 NOTE — Progress Notes (Signed)
Occupational Therapy Treatment Patient Details Name: Clarence Pollard MRN: [id number] DOB: Jun 17, 1940 Today's Date: 10/12/2014    History of present illness pt was admitted for acute encephalopathy.  He has a h/o metastatic prostate CA       Follow Up Recommendations  Home health OT;Other (comment) (at ALF)    Equipment Recommendations  None recommended by OT    Recommendations for Other Services      Precautions / Restrictions Precautions Precautions: Fall Restrictions Weight Bearing Restrictions: No       Mobility Bed Mobility Overal bed mobility: Needs Assistance Bed Mobility: Supine to Sit     Supine to sit: Min assist;HOB elevated     General bed mobility comments: Pt utilized rail to self assist; cues for sequence and min assist to complete move to sitting  Transfers Overall transfer level: Needs assistance Equipment used: Rolling walker (2 wheeled) Transfers: Sit to/from Stand Sit to Stand: Min assist Stand pivot transfers: Min assist            Balance                                   ADL Overall ADL's : Needs assistance/impaired Eating/Feeding: Set up;Sitting   Grooming: Sitting;Supervision/safety               Lower Body Dressing: Moderate assistance;Sit to/from stand       Toileting- Water quality scientist and Hygiene: Sit to/from stand;Minimal assistance;Cueing for safety;Cueing for sequencing Toileting - Clothing Manipulation Details (indicate cue type and reason): VC for hand placement              Vision                     Perception     Praxis      Cognition   Behavior During Therapy: WFL for tasks assessed/performed Overall Cognitive Status: Within Functional Limits for tasks assessed                                    Pertinent Vitals/ Pain       Pain Score: 5  Pain Location: legs  Pain Descriptors / Indicators: Spasm Pain Intervention(s): Monitored during  session;Repositioned  Home Living                                          Prior Functioning/Environment              Frequency Min 2X/week     Progress Toward Goals  OT Goals(current goals can now be found in the care plan section)  Progress towards OT goals: Progressing toward goals     Plan Discharge plan remains appropriate    Co-evaluation                 End of Session     Activity Tolerance Patient tolerated treatment well   Patient Left in bed;with call bell/phone within reach   Nurse Communication Mobility status        Time: 1250-1308 OT Time Calculation (min): 18 min  Charges: OT General Charges $OT Visit: 1 Procedure OT Treatments $Self Care/Home Management : 8-22 mins  Shalla Bulluck, Edwena Felty D 10/12/2014, 1:12 PM

## 2014-10-12 NOTE — Care Management Important Message (Signed)
Important Message  Patient Details  Name: Alver Leete MRN: [id number] Date of Birth: [birth year]   Medicare Important Message Given:  Yes-third notification given    Camillo Flaming 10/12/2014, 1:35 [place] Message  Patient Details  Name: Liston Thum MRN: [id number] Date of Birth: 1940/04/23   Medicare Important Message Given:  Yes-third notification given    Camillo Flaming 10/12/2014, 1:35 PM

## 2014-10-12 NOTE — Progress Notes (Addendum)
During rounds, chaplain visited patient. Chaplain provided a listening presence as well as support. Patient spoke of a church that he is affiliated with at the SN[facility] that he comes from. Patient enjoy the services. Chaplain offered prayer and patient consented. Chaplain prayed a prayer of comfort. Chaplain services are available   10/12/14 1200  Clinical Encounter Type  Visited With Patient  Visit Type Initial;Spiritual support;Social support  Referral From Palliative care team

## 2014-10-12 NOTE — Progress Notes (Signed)
Clinical Social Work  Patient was discussed during progression meeting and MD reports patient is not medically stable to DC today but possibly ready tomorrow. CSW spoke with Levada Dy at [facility] who is aware of DC plans. CSW will continue to follow.  [place], [place] [phone]

## 2014-10-13 DIAGNOSIS — G893 Neoplasm related pain (acute) (chronic): Secondary | ICD-10-CM

## 2014-10-13 LAB — PROTIME-INR
INR: 1.38 (ref 0.00–1.49)
Prothrombin Time: 17.1 seconds — ABNORMAL HIGH (ref 11.6–15.2)

## 2014-10-13 LAB — MAGNESIUM: Magnesium: 1.4 mg/dL — ABNORMAL LOW (ref 1.7–2.4)

## 2014-10-13 MED ORDER — SENNOSIDES-DOCUSATE SODIUM 8.6-50 MG PO TABS: 2.0000 | ORAL_TABLET | Freq: Every day | ORAL | Status: DC

## 2014-10-13 MED ORDER — DEXAMETHASONE 4 MG PO TABS: 4.0000 mg | ORAL_TABLET | Freq: Every day | ORAL | Status: DC

## 2014-10-13 MED ORDER — FENTANYL 12 MCG/HR TD PT72: 12.5000 ug | MEDICATED_PATCH | TRANSDERMAL | Status: DC

## 2014-10-13 MED ORDER — CYCLOBENZAPRINE HCL 5 MG PO TABS: 5.0000 mg | ORAL_TABLET | Freq: Three times a day (TID) | ORAL | Status: DC | PRN

## 2014-10-13 MED ORDER — METOCLOPRAMIDE HCL 5 MG PO TABS: 5.0000 mg | ORAL_TABLET | Freq: Three times a day (TID) | ORAL | Status: DC

## 2014-10-13 MED ORDER — OXYCODONE HCL 5 MG PO TABS: 5.0000 mg | ORAL_TABLET | ORAL | Status: DC | PRN

## 2014-10-13 NOTE — Progress Notes (Signed)
Occupational Therapy Treatment Patient Details Name: Clarence Pollard MRN: [id number] DOB: Jan 01, 1941 Today's Date: 10/13/2014    History of present illness pt was admitted for acute encephalopathy.  He has a h/o metastatic prostate CA    OT comments  Pt ambulated to bathroom and performed grooming sit to stand.  He cannot maintain static standing long as legs get weak.    Follow Up Recommendations  Home health OT    Equipment Recommendations  None recommended by OT    Recommendations for Other Services      Precautions / Restrictions Precautions Precautions: Fall Precaution Comments: fatiques easily with standing Restrictions Weight Bearing Restrictions: No       Mobility Bed Mobility   Bed Mobility: Supine to Sit     Supine to sit: Mod assist     General bed mobility comments: pt rolled over and pushed up.  assist for LEs and trunk  Transfers   Equipment used: Rolling walker (2 wheeled) Transfers: Sit to/from Stand Sit to Stand: Min assist         General transfer comment: assist to rise and steady; cues for hand placement    Balance                                   ADL       Grooming: Oral care;Brushing hair (and shaving (sit and stand) min guard when standing; set up for seated activities)                   Toilet Transfer: Minimal assistance;Ambulation;Comfort height toilet;Grab bars             General ADL Comments: ambulated to bathroom; used comfort height commode with grab bar and BSC on other side.  Pt has a grab bar and sink next to his commode at [facility].  He performed brushing teeth at min guard level, sit to stand.  Had to sit as legs got tired.  Then performed shaving with electric razor and brushing hair from seated position      Vision                     Perception     Praxis      Cognition   Behavior During Therapy: WFL for tasks assessed/performed Overall Cognitive Status: Within  Functional Limits for tasks assessed                       Extremity/Trunk Assessment               Exercises     Shoulder Instructions       General Comments      Pertinent Vitals/ Pain       Pain Assessment: Faces Faces Pain Scale: Hurts little more Pain Location: back, with standing Pain Descriptors / Indicators: Aching Pain Intervention(s): Limited activity within patient's tolerance;Monitored during session;Repositioned  Home Living                                          Prior Functioning/Environment              Frequency Min 2X/week     Progress Toward Goals  OT Goals(current goals can now be found in the care plan section)  Progress towards OT  goals: Progressing toward goals     Plan      Co-evaluation                 End of Session     Activity Tolerance Patient tolerated treatment well   Patient Left in chair;with call bell/phone within reach   Nurse Communication  (pt up in chair; no alarm needed)        Time: 8768-1157 OT Time Calculation (min): 25 min  Charges: OT General Charges $OT Visit: 1 Procedure OT Treatments $Self Care/Home Management : 23-37 mins  Emelda Kohlbeck 10/13/2014, 3:03 PM  Lesle Chris, OTR/L [phone] 10/13/2014

## 2014-10-13 NOTE — Progress Notes (Signed)
Date:  October 13, 2014 U.R. performed for needs and level of care. Will continue to follow for Case Management needs.  Jamilex Bohnsack, RN, BSN, CCM   [phone] 

## 2014-10-13 NOTE — Discharge Summary (Addendum)
Physician Discharge Summary  Clarence Pollard IWP:[id number] DOB: 11/21/1940 DOA: 10/07/2014  PCP: Woody Seller, MD  Admit date: 10/07/2014 Discharge date: 10/14/2014  Time spent: 20 minutes  Addendum to Dr Rhetta Mura discharge summary. Patient stable for discharge to ALF with hospice. Pain medications adjusted and prescriptions provided.  Recommendations for Outpatient Follow-up:  1. Follow up with Dr. Alen Blew as needed 2. Follow up with PCP as needed 3. Please ensure daily bowel movements  Discharge Diagnoses:  Principal Problem:   Acute encephalopathy   Active Problems:   Fever   Hallucinations   HTN (hypertension)   Weakness generalized   Prostate cancer metastatic to multiple sites   Malnutrition of moderate degree   Elevated alkaline phosphatase level   Pyrexia   Cancer related pain   Palliative care encounter   Constipated   Ileus   SBO (small bowel obstruction)   Discharge Condition: Improved  Diet recommendation: Regular  Filed Weights   10/07/14 2143  Weight: 103 kg (227 lb 1.2 oz)    History of present illness:  Please see admit h and p from 7/20 for details. Briefly , pt presented with increased confusion in the setting of metastatic prostate cancer and chronic pain. The patient was admitted for further work up.  Hospital Course:   1. Acute encephalopathy/Hallucinations 1. Much improved since this admission, seems at baseline 2. Suspected polypharmacy as pt reports taking both PO morphine and oxycodone PRN prior to admit 3. Also possibly related to metastatic disease given widespread cancer spread 4. Appreciate Palliative Care recommendations  2. Low grade fevers 1. Noted to be afebrile since patient was admitted 2. Blood cx neg 3. CXR without signs of PNA 4. Pt was started on empiric broad spectrum abx on admit 5. Question if low grade temps were significant or accurately obtained 6. Helding abx with no further  fevers   3. Weakness 1. Consulted PT/OT   4. HTN 1. BP remained stable, controlled   5. Hx metastatic prostate cancer 1. Numerous bone mets on cxr 2. Pt noted to be on Xtandi 3. Oncology (Dr. Alen Blew) following and appreciate input 4. Overall poor prognosis with limited tolerability to treatments. As such, recommendation for hospice 5. Have consulted Palliative Care for assistance with pain management as well as for end of life issues/goals of care. Appreciate input 6. Plans for hospice. Discharge on fentanyl patch q72hr, oxycodone 5-10 mg q3 hr prn and flexeril + robaxin for muscle spasms.   6. SBO 1. Resolved 2. Cont to advance diet as tolerated  Consultations:  Oncology  Palliative Care  Discharge Exam: Filed Vitals:   10/13/14 0509 10/13/14 1026 10/13/14 1421 10/13/14 1609  BP: 139/69 131/63 126/67 121/71  Pulse: 80 79 90 101  Temp: 97.7 F (36.5 C) 98.1 F (36.7 C) 98.5 F (36.9 C)   TempSrc: Oral Oral Axillary   Resp: 18 20 18    Height:      Weight:      SpO2: 96% 97% 97% 98%    General: awake, in nad Cardiovascular: regular, s1, s2 Respiratory: normal resp effort, no wheezing  Discharge Instructions     Medication List  Start taking these meds Robaxin 500 mg 1 tablet every 6 hours prn for muscle spasms fentaNYL 25 MCG/HR  Commonly known as:  DURAGESIC - dosed mcg/hr  Place 1 patch (25 mcg total) onto the skin every 3 (three) days.        STOP taking these medications  morphine 15 MG tablet  Commonly known as:  MSIR     oxycodone 5 MG capsule  Commonly known as:  OXY-IR  Replaced by:  oxyCODONE 5 MG immediate release tablet      TAKE these medications        brimonidine 0.1 % Soln  Commonly known as:  ALPHAGAN P  Place 1 drop into both eyes at bedtime.     CALCIUM + D PO  Take 1,200 Units by mouth daily.     cyclobenzaprine 5 MG tablet  Commonly known as:  FLEXERIL  Take 1 tablet (5 mg total) by mouth 3 (three) times  daily as needed for muscle spasms.     dexamethasone 4 MG tablet  Commonly known as:  DECADRON  Take 1 tablet (4 mg total) by mouth daily.              metoCLOPramide 5 MG tablet  Commonly known as:  REGLAN  Take 1 tablet (5 mg total) by mouth 3 (three) times daily before meals.     oxyCODONE 5 MG immediate release tablet  Commonly known as:  Oxy IR/ROXICODONE  Take 1-2 tablets (5-10 mg total) by mouth every 3 (three) hours as needed for breakthrough pain.     senna-docusate 8.6-50 MG per tablet  Commonly known as:  Senokot-S  Take 2 tablets by mouth at bedtime.     travoprost (benzalkonium) 0.004 % ophthalmic solution  Commonly known as:  TRAVATAN  Place 1 drop into both eyes at bedtime.     triamcinolone cream 0.1 %  Commonly known as:  KENALOG  Apply 1 application topically daily as needed (rash).     XTANDI 40 MG capsule  Generic drug:  enzalutamide  Take 160 mg by mouth daily.        No Known Allergies    The results of significant diagnostics from this hospitalization (including imaging, microbiology, ancillary and laboratory) are listed below for reference.    Significant Diagnostic Studies: Dg Chest 2 View  10/07/2014   CLINICAL DATA:  Mental status change, hallucinations, prostate malignancy with widespread bony metastases.  EXAM: CHEST  2 VIEW  COMPARISON:  None available in PACs  FINDINGS: The lungs are adequately inflated. There is no focal infiltrate. There is no pleural effusion. The cardiac silhouette is top-normal in size. The pulmonary vascularity is normal. The mediastinum is normal in width. There is diffuse skeletal sclerosis consistent with bony metastatic disease.  IMPRESSION: There is no acute cardiopulmonary abnormality. There are widespread skeletal metastases.   Electronically Signed   By: David  Martinique M.D.   On: 10/07/2014 17:03   Ct Head Wo Contrast  10/07/2014   CLINICAL DATA:  History of prostate carcinoma with metastatic bone disease and  hallucinations for 2 days, possible medication related  EXAM: CT HEAD WITHOUT CONTRAST  TECHNIQUE: Contiguous axial images were obtained from the base of the skull through the vertex without intravenous contrast.  COMPARISON:  None.  FINDINGS: Bony calvarium is intact. No gross soft tissue abnormality is noted. Slight asymmetrical dilatation of the right lateral ventricle is noted. No focal mass lesion or acute hemorrhage is noted. No findings to suggest acute infarct are seen.  IMPRESSION: No acute abnormality noted.   Electronically Signed   By: Inez Catalina M.D.   On: 10/07/2014 17:48   Dg Abd Portable 1v  10/12/2014   CLINICAL DATA:  Follow-up ileus, history of diverticulosis and previous inguinal hernia repair  EXAM: PORTABLE ABDOMEN -  1 VIEW  COMPARISON:  Supine abdominal film of October 10, 2014  FINDINGS: There remain loops of moderately distended gas-filled small bowel in the left mid abdomen. There is gaseous distention of normal caliber: Diffusely. There is decreased gas in the stomach. There is some stool and a small amount of gas in the rectum. There are widespread sclerotic bony metastases. There surgical clips in the gallbladder fossa.  IMPRESSION: Persistent diffuse ileus with mild interval improvement.   Electronically Signed   By: David  Martinique M.D.   On: 10/12/2014 07:21   Dg Abd Portable 1v  10/10/2014   CLINICAL DATA:  Acute onset generalized abdominal pain. Current history of metastatic prostate cancer.  EXAM: PORTABLE ABDOMEN - 1 VIEW  COMPARISON:  None.  FINDINGS: Moderate distention of multiple loops of jejunum in the left upper quadrant. Mild gaseous distention of the visualized stomach. Gas within normal caliber colon. No suggestion of free air on the supine image. Surgical clips in the right mid abdomen. Surgical clips in the right upper quadrant from prior cholecystectomy.  Widespread sclerotic osseous metastatic disease throughout the visualized skeleton.  IMPRESSION: Early partial  small bowel obstruction versus localized ileus involving the jejunum. Followup abdominal x-rays are suggested until resolution.   Electronically Signed   By: Evangeline Dakin M.D.   On: 10/10/2014 14:48   US Abdomen Limited Ruq  10/08/2014   CLINICAL DATA:  Patient with elevated LFTs.  Prior cholecystectomy.  EXAM: US ABDOMEN LIMITED - RIGHT UPPER QUADRANT  COMPARISON:  None.  FINDINGS: Gallbladder:  Surgically absent  Common bile duct:  Diameter: 5 mm  Liver:  Diffusely increased in echogenicity. There are a few 6-8 mm hypoechoic lesions within the liver, too small to characterize.  IMPRESSION: Hepatic steatosis.  No intrahepatic or extrahepatic biliary ductal dilatation given post cholecystectomy state.  Few tiny (6-8 mm) hypoechoic lesions in the liver, too small to characterize.   Electronically Signed   By: Lovey Newcomer M.D.   On: 10/08/2014 09:47    Microbiology: Recent Results (from the past 240 hour(s))  Culture, blood (routine x 2)     Status: None   Collection Time: 10/07/14  4:20 PM  Result Value Ref Range Status   Specimen Description BLOOD LEFT ANTECUBITAL  Final   Special Requests BOTTLES DRAWN AEROBIC AND ANAEROBIC 5 CC EA  Final   Culture   Final    NO GROWTH 5 DAYS Performed at [facility]    Report Status 10/12/2014 FINAL  Final  Culture, blood (routine x 2)     Status: None   Collection Time: 10/07/14  4:20 PM  Result Value Ref Range Status   Specimen Description BLOOD RIGHT ANTECUBITAL  Final   Special Requests BOTTLES DRAWN AEROBIC AND ANAEROBIC 5 CC EA  Final   Culture   Final    NO GROWTH 5 DAYS Performed at [facility]    Report Status 10/12/2014 FINAL  Final     Labs: Basic Metabolic Panel:  Recent Labs Lab 10/07/14 1546 10/08/14 0510 10/09/14 0523 10/10/14 0525  NA 135 132* 136 133*  K 4.1 3.3* 3.2* 3.7  CL 94* 98* 100* 95*  CO2 29 25 27 29   GLUCOSE 110* 110* 102* 131*  BUN 15 15 12 10   CREATININE 0.98 0.98 0.96 0.77  CALCIUM 9.3  8.0* 8.2* 8.7*   Liver Function Tests:  Recent Labs Lab 10/07/14 1546  AST 35  ALT 10*  ALKPHOS 1409*  BILITOT 1.4*  PROT  8.2*  ALBUMIN 2.7*   No results for input(s): LIPASE, AMYLASE in the last 168 hours. No results for input(s): AMMONIA in the last 168 hours. CBC:  Recent Labs Lab 10/07/14 1546 10/08/14 0510 10/08/14 1417 10/09/14 0523 10/10/14 0525  WBC 11.3* 9.4  --  9.1 9.1  NEUTROABS 8.7*  --   --   --   --   HGB 8.7* 7.7* 7.5* 7.4* 8.3*  HCT 28.5* 25.2* 24.2* 23.5* 26.8*  MCV 76.6* 75.4*  --  75.8* 75.7*  PLT 361 273  --  321 360   Cardiac Enzymes: No results for input(s): CKTOTAL, CKMB, CKMBINDEX, TROPONINI in the last 168 hours. BNP: BNP (last 3 results) No results for input(s): BNP in the last 8760 hours.  ProBNP (last 3 results) No results for input(s): PROBNP in the last 8760 hours.  CBG: No results for input(s): GLUCAP in the last 168 hours.   Signed:  CHIU, STEPHEN K  [facility] 10/13/2014, 4:31 PM

## 2014-10-13 NOTE — Progress Notes (Signed)
Nutrition Follow-up  DOCUMENTATION CODES:   Non-severe (moderate) malnutrition in context of chronic illness, Obesity unspecified  INTERVENTION:  - Continue Ensure Enlive BID, each supplement provides 350 kcal and 20 grams of protein - RD will continue to monitor for needs  NUTRITION DIAGNOSIS:   Increased nutrient needs related to catabolic illness, cancer and cancer related treatments as evidenced by estimated needs. -ongoing  GOAL:   Patient will meet greater than or equal to 90% of their needs -unmet  MONITOR:   PO intake, Supplement acceptance, Weight trends, Labs  ASSESSMENT:  74 y.o. male with a history of Metastatic Prostate Cancer on Oral Chemotherapy of Xtandi, who presents to the ED with complaints of confusion and hallucinations today. He was to be seen at the [facility] for an appointment today but did not feel well due to increased weakness and poor appetite, and he was advised to go to the ED. He was found to have a low grade fever in the ED to 100.1 and blood cultures were sent . A CT scan of the Head was also performed and was negative for acute findings.   7/26 -  Pt ate 50% of breakfast and lunch 7/22 and 7/22 on Heart Healthy diet; transitioned to NPO after lunch 7/23. - CLD ordered yesterday and pt consumed 75% lunch yesterday and breakfast today on this diet - FLD ordered after breakfast this AM. Ensure Enlive ordered BID. - Not meeting needs. - Palliative note 7/23 states pt to d/c home with hospice. MD note yesterday evening lists disposition plan as "pending." - Medications reviewed. Labs reviewed; Na: 133 mmol/L, Cl: 95 mmol/L, Ca: 8.7 mgd/L.    7/21 - Pt ate 3/4 piece of sausage, a few bites of egg, and 1/2 of a tomato for late breakfast today. - Pt reports decreased appetite over the past few months although he is unable to pinpoint when this started. - PTA he was drinking Ensure and is interested in receiving it during admission. He was also  drinking V8 juice at the senior center he [facility].  - He indicates that he has lost ~25 lbs in the past 6 months. This would indicate 10% weight loss in 6 months which is significant for time frame.     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  Estimated Nutritional Needs:   Kcal:  2400-2600  Protein:  115-125 grams  Fluid:  2.5 L/day  EDUCATION NEEDS:   No education needs identified at this time     Jarome Matin, RD, LDN Inpatient Clinical Dietitian Pager # [phone] After hours/weekend pager # [phone]

## 2014-10-13 NOTE — Progress Notes (Signed)
[facility] PROGRESS NOTE  Clarence Pollard TXM:[id number] DOB: March 22, 1940 DOA: 10/07/2014 PCP: Woody Seller, MD  Assessment/Plan: 1. Acute encephalopathy/Hallucinations 1. Much improved since this admission, seems at baseline 2. Suspected polypharmacy as pt reports taking both PO morphine and oxycodone PRN prior to admit 3. Also possibly related to metastatic disease given widespread cancer spread 4. Appreciate Palliative Care recommendations 2. Low grade fevers 1. Noted to be afebrile since patient was admitted 2. Blood cx neg 3. CXR without signs of PNA 4. Pt was started on empiric broad spectrum abx on admit 5. Question if low grade temps were significant or accurately obtained 6. Holding abx and cont to monitor for fevers 3. Weakness 1. Consulted PT/OT 4. HTN 1. BP stable, controlled 5. Hx metastatic prostate cancer 1. Numerous bone mets on cxr 2. Pt noted to be on Xtandi 3. Oncology (Dr. Alen Blew) following and appreciate input 4. Overall poor prognosis with limited tolerability to treatments. As such, recommendation for hospice 5. Have consulted Palliative Care for assistance with pain management as well as for end of life issues/goals of care. Appreciate input 6. Plans for hospice 6. DVT prophylaxis 1. Lovenox subQ while inpatient 7. SBO 1. Seems to be resolving 2. Pt reports BM today 3. Cont to advance diet as tolerated Code Status: Full Family Communication: Pt in room, daughter over phone Disposition Plan: Pending   Consultants:  Oncology  Palliative Care  Procedures:    Antibiotics:  Vancomycin 7/20>>>7/20  Zosyn 7/20>>>7/20  HPI/Subjective: Eager to starting more regular diet  Objective: Filed Vitals:   10/13/14 0509 10/13/14 1026 10/13/14 1421 10/13/14 1609  BP: 139/69 131/63 126/67 121/71  Pulse: 80 79 90 101  Temp: 97.7 F (36.5 C) 98.1 F (36.7 C) 98.5 F (36.9 C)   TempSrc: Oral Oral Axillary   Resp: 18 20 18    Height:       Weight:      SpO2: 96% 97% 97% 98%    Intake/Output Summary (Last 24 hours) at 10/13/14 1618 Last data filed at 10/13/14 1422  Gross per 24 hour  Intake    714 ml  Output   1130 ml  Net   -416 ml   Filed Weights   10/07/14 2143  Weight: 103 kg (227 lb 1.2 oz)    Exam:   General:  Awake, laying in bed, in nad  Cardiovascular: regular, s1, s2  Respiratory: normal resp effort, no wheezing  Abdomen: soft, less distended, pos BS, nontender  Musculoskeletal: perfused, no cyanosis, no clubbing  Data Reviewed: Basic Metabolic Panel:  Recent Labs Lab 10/07/14 1546 10/08/14 0510 10/09/14 0523 10/10/14 0525  NA 135 132* 136 133*  K 4.1 3.3* 3.2* 3.7  CL 94* 98* 100* 95*  CO2 29 25 27 29   GLUCOSE 110* 110* 102* 131*  BUN 15 15 12 10   CREATININE 0.98 0.98 0.96 0.77  CALCIUM 9.3 8.0* 8.2* 8.7*   Liver Function Tests:  Recent Labs Lab 10/07/14 1546  AST 35  ALT 10*  ALKPHOS 1409*  BILITOT 1.4*  PROT 8.2*  ALBUMIN 2.7*   No results for input(s): LIPASE, AMYLASE in the last 168 hours. No results for input(s): AMMONIA in the last 168 hours. CBC:  Recent Labs Lab 10/07/14 1546 10/08/14 0510 10/08/14 1417 10/09/14 0523 10/10/14 0525  WBC 11.3* 9.4  --  9.1 9.1  NEUTROABS 8.7*  --   --   --   --   HGB 8.7* 7.7* 7.5* 7.4* 8.3*  HCT 28.5* 25.2*  24.2* 23.5* 26.8*  MCV 76.6* 75.4*  --  75.8* 75.7*  PLT 361 273  --  321 360   Cardiac Enzymes: No results for input(s): CKTOTAL, CKMB, CKMBINDEX, TROPONINI in the last 168 hours. BNP (last 3 results) No results for input(s): BNP in the last 8760 hours.  ProBNP (last 3 results) No results for input(s): PROBNP in the last 8760 hours.  CBG: No results for input(s): GLUCAP in the last 168 hours.  Recent Results (from the past 240 hour(s))  Culture, blood (routine x 2)     Status: None   Collection Time: 10/07/14  4:20 PM  Result Value Ref Range Status   Specimen Description BLOOD LEFT ANTECUBITAL  Final    Special Requests BOTTLES DRAWN AEROBIC AND ANAEROBIC 5 CC EA  Final   Culture   Final    NO GROWTH 5 DAYS Performed at [facility]    Report Status 10/12/2014 FINAL  Final  Culture, blood (routine x 2)     Status: None   Collection Time: 10/07/14  4:20 PM  Result Value Ref Range Status   Specimen Description BLOOD RIGHT ANTECUBITAL  Final   Special Requests BOTTLES DRAWN AEROBIC AND ANAEROBIC 5 CC EA  Final   Culture   Final    NO GROWTH 5 DAYS Performed at [facility]    Report Status 10/12/2014 FINAL  Final     Studies: Dg Abd Portable 1v  10/12/2014   CLINICAL DATA:  Follow-up ileus, history of diverticulosis and previous inguinal hernia repair  EXAM: PORTABLE ABDOMEN - 1 VIEW  COMPARISON:  Supine abdominal film of October 10, 2014  FINDINGS: There remain loops of moderately distended gas-filled small bowel in the left mid abdomen. There is gaseous distention of normal caliber: Diffusely. There is decreased gas in the stomach. There is some stool and a small amount of gas in the rectum. There are widespread sclerotic bony metastases. There surgical clips in the gallbladder fossa.  IMPRESSION: Persistent diffuse ileus with mild interval improvement.   Electronically Signed   By: David  Martinique M.D.   On: 10/12/2014 07:21    Scheduled Meds: . antiseptic oral rinse  7 mL Mouth Rinse q12n4p  . calcium-vitamin D  2 tablet Oral BID  . chlorhexidine  15 mL Mouth Rinse BID  . dexamethasone  4 mg Oral Daily  . enoxaparin (LOVENOX) injection  40 mg Subcutaneous QHS  . feeding supplement (ENSURE ENLIVE)  237 mL Oral BID BM  . fentaNYL  12.5 mcg Transdermal Q72H  . latanoprost  1 drop Both Eyes QHS  . metoCLOPramide  5 mg Oral TID AC  . ondansetron (ZOFRAN) IV  4 mg Intravenous Once  . senna-docusate  2 tablet Oral QHS   Continuous Infusions: . sodium chloride 75 mL/hr at 10/13/14 6314    Principal Problem:   Acute encephalopathy Active Problems:   Fever    Hallucinations   HTN (hypertension)   Weakness generalized   Prostate cancer metastatic to multiple sites   Malnutrition of moderate degree   Elevated alkaline phosphatase level   Pyrexia   Cancer related pain   Palliative care encounter   Constipated   Ileus   SBO (small bowel obstruction)   Clarence Pollard, [facility] Pager [phone]. If 7PM-7AM, please contact night-coverage at [email or web address], password [facility] 10/13/2014, 4:18 PM  LOS: 5 days

## 2014-10-13 NOTE — Progress Notes (Signed)
Physical Therapy Treatment Patient Details Name: Chinedu Agustin MRN: [id number] DOB: [birth year] Today's Date: 10/13/2014    History of Present Illness pt was admitted for acute encephalopathy.  He has a h/o metastatic prostate CA     PT Comments    At start of tx pt was sitting in recliner and reported dizziness, he'd just had a BM per RN. BP 121/71, HR 101, SaO2 98% on RA. Assisted pt back to bed. Treatment limited by dizziness.   Follow Up Recommendations  Home health PT     Equipment Recommendations  None recommended by PT    Recommendations for Other Services OT consult     Precautions / Restrictions Precautions Precautions: Fall Precaution Comments: fatiques easily with standing Restrictions Weight Bearing Restrictions: No    Mobility  Bed Mobility Overal bed mobility: Needs Assistance Bed Mobility: Sit to Supine     Supine to sit: Mod assist Sit to supine: Min assist   General bed mobility comments: pt up in chair, had just used [facility] with RN and stated he was feeling dizzy, assisted pt to bed with min A to bring BLEs into bed  Transfers Overall transfer level: Needs assistance Equipment used: Rolling walker (2 wheeled) Transfers: Sit to/from [facility] Sit to Stand: Min assist Stand pivot transfers: Min assist       General transfer comment: assist to rise and steady; cues for hand placement  Ambulation/Gait             General Gait Details: deferred 2* pt reporting dizziness in sitting   Stairs            Wheelchair Mobility    Modified Rankin (Stroke Patients Only)       Balance Overall balance assessment: Needs assistance   Sitting balance-Leahy Scale: Good       Standing balance-Leahy Scale: Poor                      Cognition Arousal/Alertness: Awake/alert Behavior During Therapy: WFL for tasks assessed/performed Overall Cognitive Status: Within Functional Limits for tasks assessed                      Exercises      General Comments        Pertinent Vitals/Pain Pain Assessment: Faces Pain Score: 7  Faces Pain Scale: Hurts little more Pain Location: B calves Pain Descriptors / Indicators: Cramping Pain Intervention(s): Limited activity within patient's tolerance;Monitored during session;Patient requesting pain meds-RN notified    Home Living                      Prior Function            PT Goals (current goals can now be found in the care plan section) Acute Rehab PT Goals Patient Stated Goal: Resume previous lifestyle but considering move to hospice PT Goal Formulation: With patient Time For Goal Achievement: 10/23/14 Potential to Achieve Goals: Fair Progress towards PT goals: Not progressing toward goals - comment (limited by dizziness)    Frequency  Min 3X/week    PT Plan Current plan remains appropriate    Co-evaluation             End of Session Equipment Utilized During Treatment: Gait belt Activity Tolerance: Patient limited by fatigue;Treatment limited secondary to medical complications (Comment) (dizziness) Patient left: with call bell/phone within reach;in bed;with nursing/sitter in room     Time: 2542-7062 PT Time  Calculation (min) (ACUTE ONLY): 14 min  Charges:  $Therapeutic Activity: 8-22 mins                    G Codes:      Philomena Doheny 10/13/2014, 4:14 PM [phone]

## 2014-10-13 NOTE — Progress Notes (Signed)
Daily Progress Note   Patient Name: Clarence Pollard       Date: 10/13/2014 DOB: [birth year]  Age: 74 y.o. MRN#: [id number] Attending Physician: Donne Hazel, MD Primary Care Physician: Woody Seller, MD Admit Date: 10/07/2014  Reason for Consultation/Follow-up: Pain control  Subjective:  -assisted back to bed with help from PT, "relieved" to have had a large BM.  Pain has been controlled today with current medications, however he continues to c/o "muscle spasms" in his calfs.  He tells me this has happened in the past when he had back surgery, can't really say but  "maybe the muscle relaxers are helping".  We talked about and demonstrated ankle pumps, will check a magnesium level  -focus of care is comfort  -spoke with both patient and his daughter regarding discharge plan--back to [facility] with hospice services.    Length of Stay: 5 days  Current Medications: Scheduled Meds:  . antiseptic oral rinse  7 mL Mouth Rinse q12n4p  . calcium-vitamin D  2 tablet Oral BID  . chlorhexidine  15 mL Mouth Rinse BID  . dexamethasone  4 mg Oral Daily  . enoxaparin (LOVENOX) injection  40 mg Subcutaneous QHS  . feeding supplement (ENSURE ENLIVE)  237 mL Oral BID BM  . fentaNYL  12.5 mcg Transdermal Q72H  . latanoprost  1 drop Both Eyes QHS  . metoCLOPramide  5 mg Oral TID AC  . ondansetron (ZOFRAN) IV  4 mg Intravenous Once  . senna-docusate  2 tablet Oral QHS    Continuous Infusions: . sodium chloride 75 mL/hr at 10/13/14 0731    PRN Meds: acetaminophen **OR** acetaminophen, alum & mag hydroxide-simeth, cyclobenzaprine, HYDROmorphone (DILAUDID) injection, ondansetron **OR** ondansetron (ZOFRAN) IV, oxyCODONE  Palliative Performance Scale: 50%     Vital Signs: BP 121/71 mmHg  Pulse 101  Temp(Src) 98.5 F (36.9 C) (Axillary)  Resp 18  Ht 6' (1.829 m)  Wt 103 kg (227 lb 1.2 oz)  BMI 30.79 kg/m2  SpO2 98% SpO2: SpO2: 98 % O2 Device: O2 Device: Not Delivered O2 Flow  Rate:    Intake/output summary:   Intake/Output Summary (Last 24 hours) at 10/13/14 1610 Last data filed at 10/13/14 1422  Gross per 24 hour  Intake    714 ml  Output   1130 ml  Net   -416 ml   LBM:   Baseline Weight: Weight: 103 kg (227 lb 1.2 oz) Most recent weight: Weight: 103 kg (227 lb 1.2 oz)  Physical Exam: General: weak, frail Resp:CTA Cardiac: RRR Skin: warm and dry, pale            Additional Data Reviewed: No results for input(s): WBC, HGB, PLT, NA, BUN, CREATININE, ALB in the last 72 hours.   Problem List:  Patient Active Problem List   Diagnosis Date Noted  . SBO (small bowel obstruction)   . Constipated   . Ileus   . Cancer related pain   . Palliative care encounter   . Elevated alkaline phosphatase level   . Pyrexia   . Hallucinations 10/08/2014  . HTN (hypertension) 10/08/2014  . Weakness generalized 10/08/2014  . Prostate cancer metastatic to multiple sites 10/08/2014  . Malnutrition of moderate degree 10/08/2014  . Fever 10/07/2014  . Acute encephalopathy 10/07/2014     Palliative Care Assessment & Plan    Code Status:  DNR  Goals of Care:  Home/AL/ [facility] with hospice in morning, will need ambulance transport   3. Symptom Management  Pain: Controlled . Continue Fentanyl 12 mcg TD q 72 with oxycodone 5-10 mg q2 prn for pain. Continue decadron 4mg  daily for bone pain  Constipation: Start senna 2 tabs qhs   4. Palliative Prophylaxis:   Stool Softner: senna 2 tabs qhs  5. Prognosis: < 6 months  5. Discharge Planning: Home with Hospice/ [facility] plan was discussed with Dr. Wyline Copas and Sindy Messing LCSW  Thank you for allowing the Palliative Medicine Team to assist in the care of this patient.   Time In: 1600 Time Out: 1635 Total Time 35 Prolonged Time Billed  no     Greater than 50%  of this time was spent counseling and coordinating care related to the above assessment and plan.   Knox Royalty, NP  10/13/2014, 4:10 PM  Please contact Palliative Medicine Team phone at [phone] for questions and concerns.

## 2014-10-14 ENCOUNTER — Telehealth: Payer: Self-pay

## 2014-10-14 LAB — PROTIME-INR
INR: 1.34 (ref 0.00–1.49)
PROTHROMBIN TIME: 16.7 s — AB (ref 11.6–15.2)

## 2014-10-14 MED ORDER — CYCLOBENZAPRINE HCL 5 MG PO TABS: 5.0000 mg | ORAL_TABLET | Freq: Three times a day (TID) | ORAL | Status: DC | PRN

## 2014-10-14 MED ORDER — METHOCARBAMOL 500 MG PO TABS: 500.0000 mg | ORAL_TABLET | Freq: Four times a day (QID) | ORAL | Status: DC | PRN

## 2014-10-14 MED ORDER — FENTANYL 25 MCG/HR TD PT72: 25.0000 ug | MEDICATED_PATCH | TRANSDERMAL | Status: DC

## 2014-10-14 MED ORDER — OXYCODONE HCL 5 MG PO TABS: 5.0000 mg | ORAL_TABLET | ORAL | Status: DC | PRN

## 2014-10-14 MED ORDER — METHOCARBAMOL 1000 MG/10ML IJ SOLN
500.0000 mg | Freq: Once | INTRAVENOUS | Status: AC
  Administered 2014-10-14: 500 mg via INTRAVENOUS
  Filled 2014-10-14: qty 5

## 2014-10-14 NOTE — Telephone Encounter (Signed)
S/w Erline Levine RN and called hospice back. Dr Alen Blew will be attending with hospice MDs to do symptom management.

## 2014-10-14 NOTE — Progress Notes (Addendum)
Clinical Social Work  CSW faxed FL2 and DC summary to [facility] and spoke with Levada Dy who is agreeable for patient to return. CSW prepared DC packet with FL2, DNR and hard scripts included. CSW met with patient in room with hospice liaison Lattie Haw) who reports that hospice will deliver equipment this afternoon. CSW to arrange transportation for 4 pm but patient wanted CSW to clarify with dtr if she wanted to transport him or if PTAR should be arranged. CSW left a message for dtr and awaiting return call.  [place], [place] [phone]  Addendum 1330 Dtr prefers PTAR. PTAR arranged for 4 pm

## 2014-10-14 NOTE — Progress Notes (Signed)
Notified by Suanne Marker [facility] of pt./family request for Hospice and [place] services at home after discharge.  Writer spoke with patient at the bedside to initiate education related to  Hospice philosophy, services and team approach to care. Pt. Verbalized understanding of information provided. Per discussion plan is for discharge to home [facility]) by [place] today.  Please send signed  and completed DNR form home with patient. Pt. Will need prescriptions for discharge comfort medication.  DME needs discussed and pt. Requests an electric bed with APP and a table, and a BSC. [facility] equipment manager Jewel Ysidro Evert notified and will contact [place] to arrange delivery today. The facility address has been verified and is correct in the chart. Pt's dtr. Is the contact person for delivery.  [facility] referral Center aware of above. Please notify [facility] when pt. Is ready  to leave unit at discharge- call [phone] or [phone] after 5 pm) [facility] information and contact numbers have been given to the patient and his dtr. Please call with any questions.  [facility] Liaison [phone]

## 2014-10-14 NOTE — Telephone Encounter (Signed)
evette from hospice called stating pt was being discharged from hospital and asking if dr Alen Blew will be the attending with hospice MDs assisting.  evette's number [phone]

## 2014-10-14 NOTE — Progress Notes (Signed)
[facility] PROGRESS NOTE  Clarence Pollard DGL:[id number] DOB: [birth year] DOA: 10/07/2014 PCP: Woody Seller, MD  Assessment/Plan: Metastatic prostate ca  d/c to ALF with hospice follow up. Pain meds adjusted. i have increased his fentanyl patch dose and added robaxin for muscle spasms. continue oxycodone and flexeril as prescribed.  See d/c summary for details   Code Status: DNR Family Communication: sister at bedside Disposition Plan: d/c home with home hospice      HPI/Subjective: Still having off and on pain and muscle spasms  Objective: Filed Vitals:   10/14/14 1029  BP: 137/70  Pulse: 87  Temp: 97.7 F (36.5 C)  Resp: 18    Intake/Output Summary (Last 24 hours) at 10/14/14 1040 Last data filed at 10/14/14 0950  Gross per 24 hour  Intake   1452 ml  Output   1405 ml  Net     47 ml   Filed Weights   10/07/14 2143  Weight: 103 kg (227 lb 1.2 oz)    Exam:   General: NAD   HEENT: moist mucosa   chest:clear rb/l   CVS: N S1&S2   GI: mild distention with some tenderness to palpation  Data Reviewed: Basic Metabolic Panel:  Recent Labs Lab 10/07/14 1546 10/08/14 0510 10/09/14 0523 10/10/14 0525 10/13/14 1734  NA 135 132* 136 133*  --   K 4.1 3.3* 3.2* 3.7  --   CL 94* 98* 100* 95*  --   CO2 29 25 27 29   --   GLUCOSE 110* 110* 102* 131*  --   BUN 15 15 12 10   --   CREATININE 0.98 0.98 0.96 0.77  --   CALCIUM 9.3 8.0* 8.2* 8.7*  --   MG  --   --   --   --  1.4*   Liver Function Tests:  Recent Labs Lab 10/07/14 1546  AST 35  ALT 10*  ALKPHOS 1409*  BILITOT 1.4*  PROT 8.2*  ALBUMIN 2.7*   No results for input(s): LIPASE, AMYLASE in the last 168 hours. No results for input(s): AMMONIA in the last 168 hours. CBC:  Recent Labs Lab 10/07/14 1546 10/08/14 0510 10/08/14 1417 10/09/14 0523 10/10/14 0525  WBC 11.3* 9.4  --  9.1 9.1  NEUTROABS 8.7*  --   --   --   --   HGB 8.7* 7.7* 7.5* 7.4* 8.3*  HCT 28.5* 25.2* 24.2*  23.5* 26.8*  MCV 76.6* 75.4*  --  75.8* 75.7*  PLT 361 273  --  321 360   Cardiac Enzymes: No results for input(s): CKTOTAL, CKMB, CKMBINDEX, TROPONINI in the last 168 hours. BNP (last 3 results) No results for input(s): BNP in the last 8760 hours.  ProBNP (last 3 results) No results for input(s): PROBNP in the last 8760 hours.  CBG: No results for input(s): GLUCAP in the last 168 hours.  Recent Results (from the past 240 hour(s))  Culture, blood (routine x 2)     Status: None   Collection Time: 10/07/14  4:20 PM  Result Value Ref Range Status   Specimen Description BLOOD LEFT ANTECUBITAL  Final   Special Requests BOTTLES DRAWN AEROBIC AND ANAEROBIC 5 CC EA  Final   Culture   Final    NO GROWTH 5 DAYS Performed at [facility]    Report Status 10/12/2014 FINAL  Final  Culture, blood (routine x 2)     Status: None   Collection Time: 10/07/14  4:20 PM  Result Value Ref Range  Status   Specimen Description BLOOD RIGHT ANTECUBITAL  Final   Special Requests BOTTLES DRAWN AEROBIC AND ANAEROBIC 5 CC EA  Final   Culture   Final    NO GROWTH 5 DAYS Performed at [facility]    Report Status 10/12/2014 FINAL  Final     Studies: No results found.  Scheduled Meds: . antiseptic oral rinse  7 mL Mouth Rinse q12n4p  . calcium-vitamin D  2 tablet Oral BID  . chlorhexidine  15 mL Mouth Rinse BID  . dexamethasone  4 mg Oral Daily  . enoxaparin (LOVENOX) injection  40 mg Subcutaneous QHS  . feeding supplement (ENSURE ENLIVE)  237 mL Oral BID BM  . fentaNYL  12.5 mcg Transdermal Q72H  . latanoprost  1 drop Both Eyes QHS  . methocarbamol (ROBAXIN)  IV  500 mg Intravenous Once  . metoCLOPramide  5 mg Oral TID AC  . ondansetron (ZOFRAN) IV  4 mg Intravenous Once  . senna-docusate  2 tablet Oral QHS   Continuous Infusions: . sodium chloride 75 mL/hr at 10/14/14 0943      Time spent: 15 minutes    Clarence Pollard, [place]  [facility] Pager [phone] If 7PM-7AM,  please contact night-coverage at [email or web address], password [facility] 10/14/2014, 10:40 AM  LOS: 6 days

## 2014-10-14 NOTE — Progress Notes (Signed)
Collene Schlichter to be D/C'd Berna Spare per MD order.  Discussed with the patient and all questions fully answered.  VSS, Skin clean, dry and intact without evidence of skin break down, no evidence of skin tears noted. IV catheter discontinued intact. Site without signs and symptoms of complications. Dressing and pressure applied.  An After Visit Summary was printed and given to the patient. Patient received prescription.  D/c education completed with patient/family including follow up instructions, medication list, d/c activities limitations if indicated, with other d/c instructions as indicated by MD - patient able to verbalize understanding, all questions fully answered.    Patient escorted via stretcher, and D/C to [facility] with hospice via [place].  Alonia Dibuono D 10/14/2014 1:53 PM

## 2014-11-19 DEATH — deceased

## 2016-01-04 IMAGING — CR DG CHEST 1V PORT
1 series · 1 of 1 positions shown · non-contrast
Comparison: None.

CLINICAL DATA: Central line placement.

EXAM:
PORTABLE CHEST - 1 VIEW

[AP]
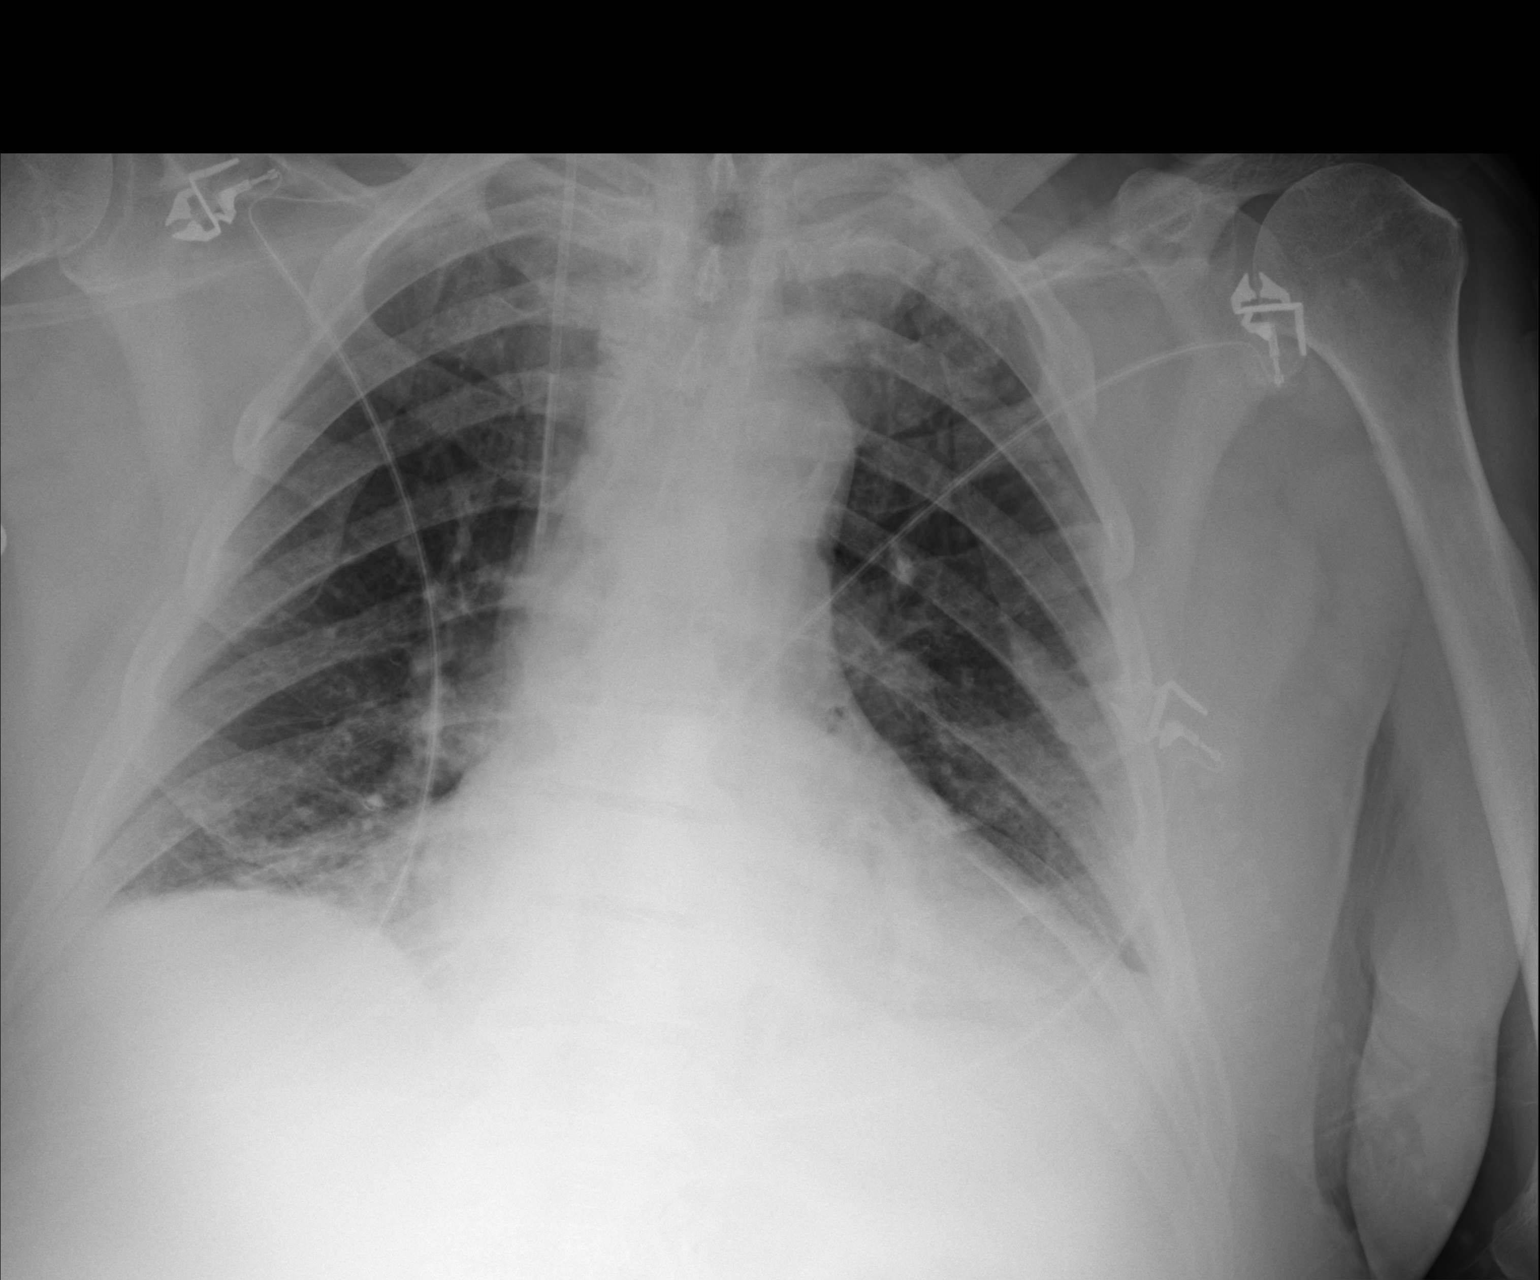

[1 of 1 positions shown; findings below may reference images not displayed]

FINDINGS: Mildly enlarged cardiac silhouette. Mild bibasilar atelectasis.
Right jugular catheter tip in the superior vena cava. No
pneumothorax. Thoracic spine degenerative changes.
IMPRESSION: 1. Right jugular catheter tip in the superior vena cava without
pneumothorax.
2. Mild cardiomegaly and mild bibasilar atelectasis.

## 2016-01-16 IMAGING — RF DG MYELOGRAPHY LUMBAR INJ THORACIC
13 series · 13 of 13 positions shown · non-contrast
Comparison: none

ADDENDUM:
Critical Value/emergent results were called by telephone at the time
of interpretation on 02/10/2014 at [DATE] to Dr. Deshaun , who
verbally acknowledged these results.
CLINICAL DATA: Metastatic prostate cancer. Thoracic laminectomy and
decompression 01/29/2014. CT myelogram requested for radiation
therapy planning purposes.
TECHNIQUE: Contiguous axial images were obtained through the Thoracic and
Lumbar spine after the intrathecal infusion of infusion. Coronal and
sagittal reconstructions were obtained of the axial image sets.

[Series 2: cp_standard · 0.19mm/px · 1 of 1 slices shown]
[im 1/1]
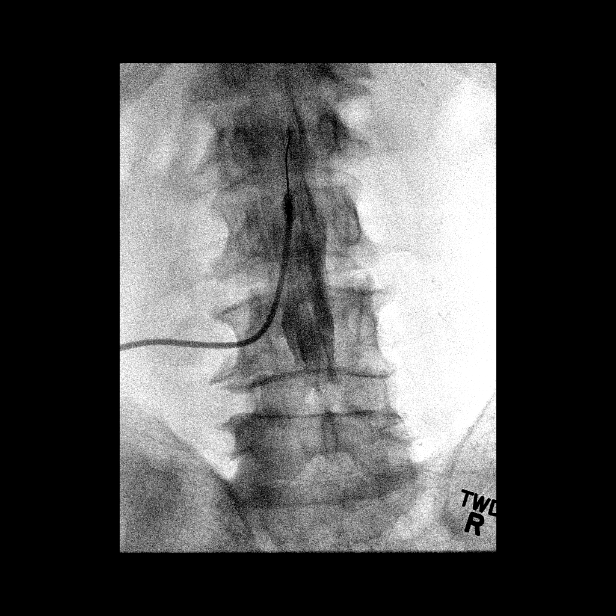

[Series 5: fluoro_myelogram_singleshot_bw · 0.20mm/px · 1 of 1 slices shown (1 of 10)]
[im 1/1]
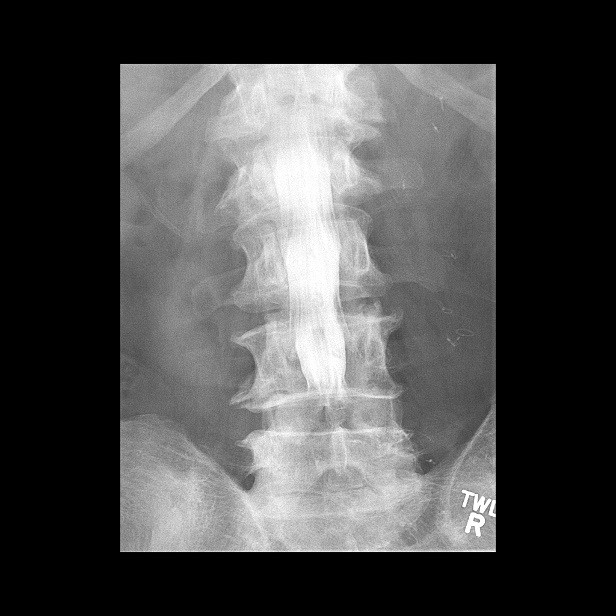

[Series 6: fluoro_myelogram_singleshot_bw · 0.20mm/px · 1 of 1 slices shown (2 of 10)]
[im 1/1]
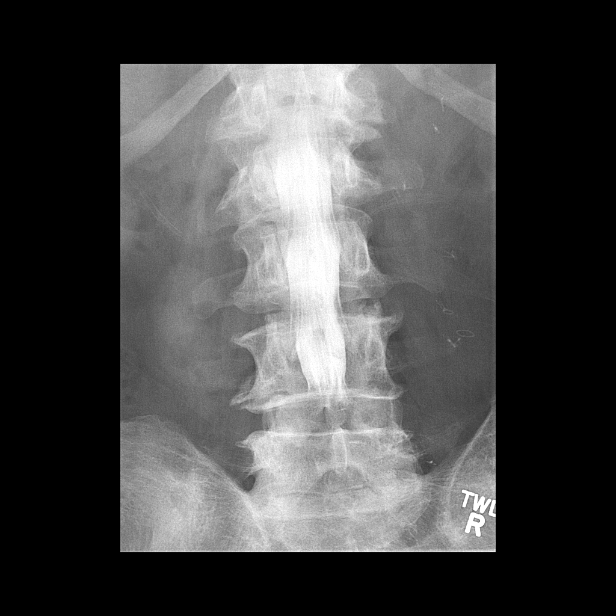

[Series 7: fluoro_myelogram_singleshot_bw · 0.20mm/px · 1 of 1 slices shown (3 of 10)]
[im 1/1]
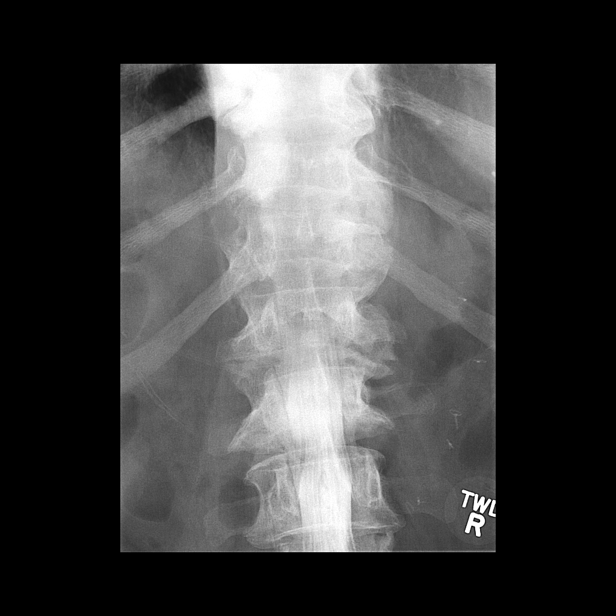

[Series 8: fluoro_myelogram_singleshot_bw · 0.20mm/px · 1 of 1 slices shown (4 of 10)]
[im 1/1]
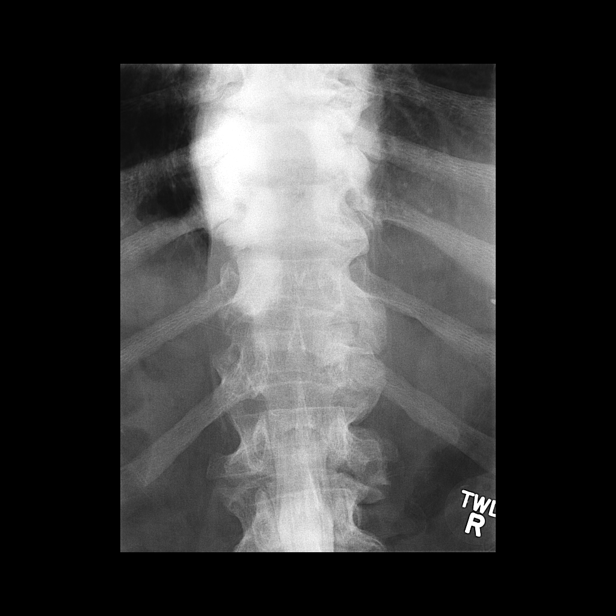

[Series 9: fluoro_myelogram_singleshot_bw · 0.20mm/px · 1 of 1 slices shown (5 of 10)]
[im 1/1]
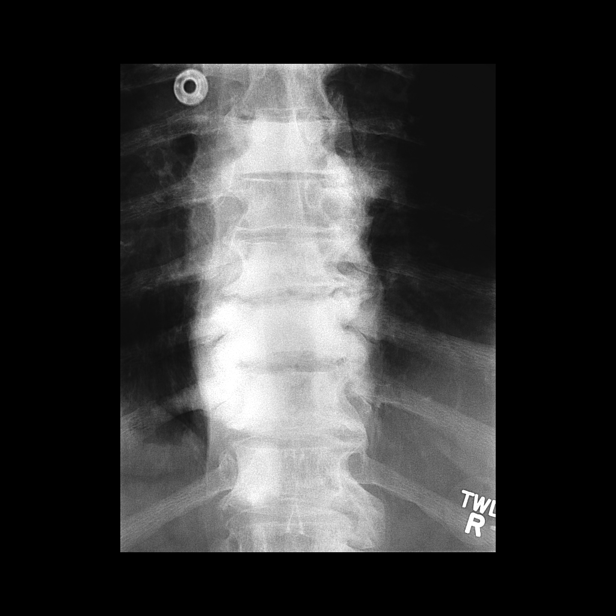

[Series 10: fluoro_myelogram_singleshot_bw · 0.21mm/px · 1 of 1 slices shown (6 of 10)]
[im 1/1]
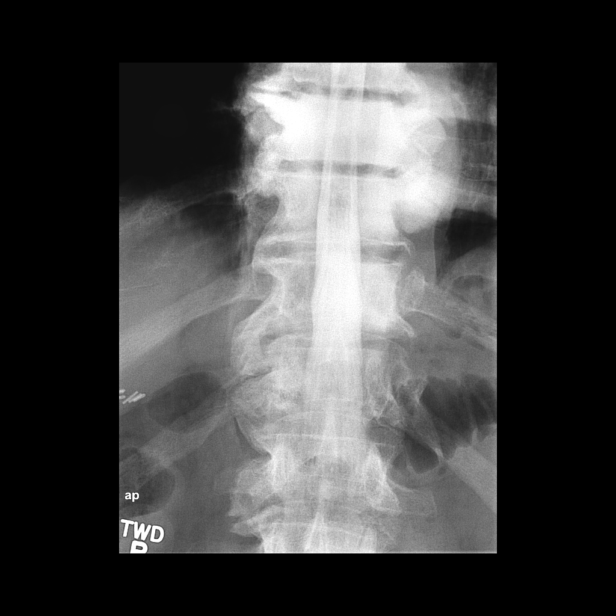

[Series 11: fluoro_myelogram_singleshot_bw · 0.21mm/px · 1 of 1 slices shown (7 of 10)]
[im 1/1]
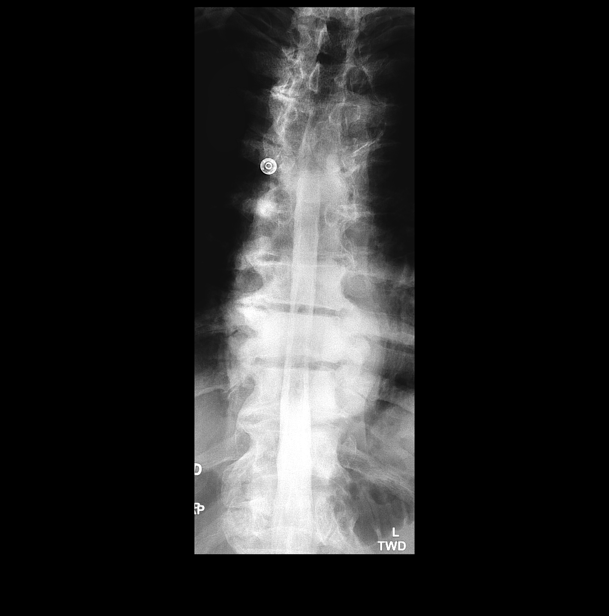

[Series 12: fluoro_myelogram_singleshot_bw · 0.21mm/px · 1 of 1 slices shown (8 of 10)]
[im 1/1]
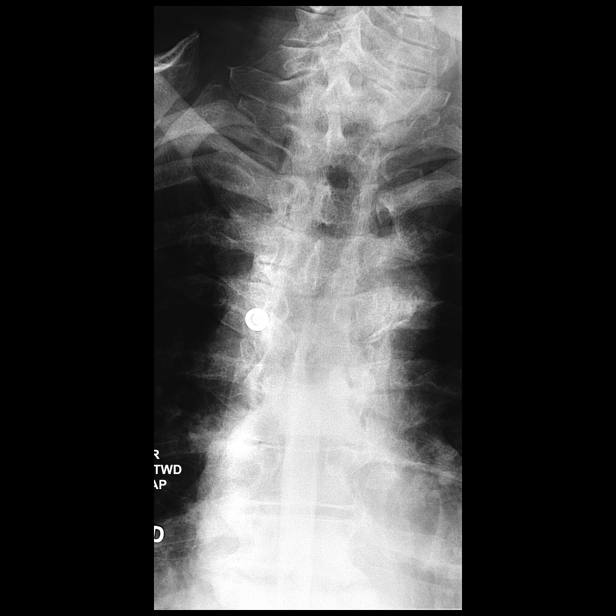

[Series 13: fluoro_myelogram_singleshot_bw · 0.20mm/px · 1 of 1 slices shown (9 of 10)]
[im 1/1]
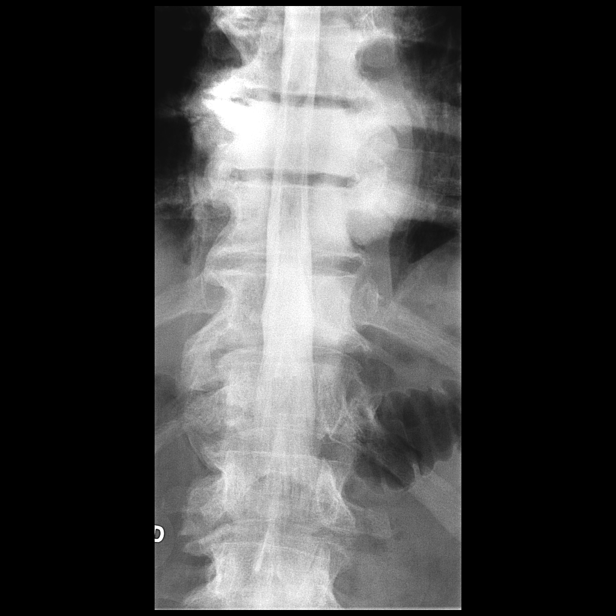

[Series 14: fluoro_myelogram_singleshot_bw · 0.21mm/px · 1 of 1 slices shown (10 of 10)]
[im 1/1]
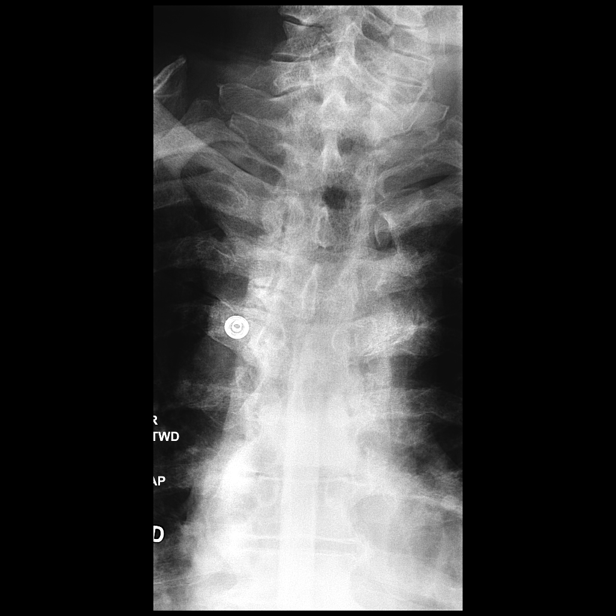

[Series 16: x thoracic spine lat · 0.15mm/px · 1 of 1 slices shown (1 of 2)]
[im 1/1]
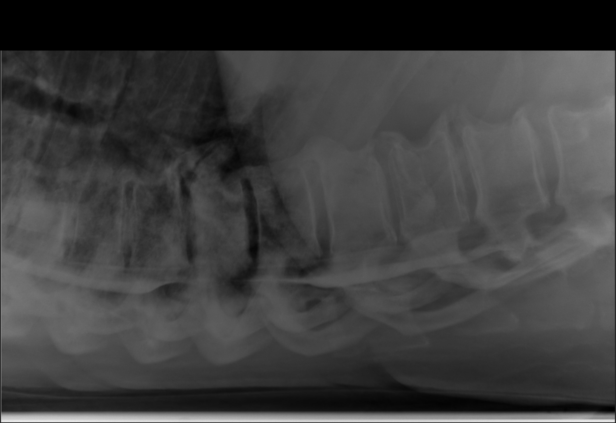

[Series 17: x thoracic spine lat · 0.15mm/px · 1 of 1 slices shown (2 of 2)]
[im 1/1]
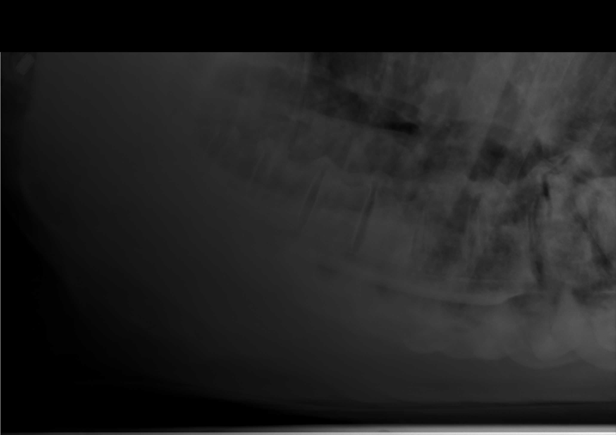

[13 of 13 positions shown; findings below may reference images not displayed]

FLUOROSCOPY TIME:  3 min 12 seconds

PROCEDURE:
LUMBAR PUNCTURE FOR THORACIC AND LUMBAR MYELOGRAM

After thorough discussion of risks and benefits of the procedure
including bleeding, infection, injury to nerves, blood vessels,
adjacent structures as well as headache and CSF leak, written and
oral informed consent was obtained. Consent was obtained by Dr.
Yonasse Pro.

I personally performed the lumbar puncture and administered the
intrathecal contrast. I also personally supervised acquisition of
the myelogram images.

Patient was positioned prone on the fluoroscopy table. Local
anesthesia was provided with 1% lidocaine without epinephrine after
prepped and draped in the usual sterile fashion. Puncture was
performed at L2-3 using a 3 1/2 inch 22-gauge spinal needle via
paramedian approach. Using a single pass through the dura, the
needle was placed within the thecal sac, with return of clear CSF.
10 mL of Vmnipaque-NEE was injected into the thecal sac, with normal
opacification of the nerve roots and cauda equina consistent with
free flow within the subarachnoid space. The patient was then moved
to the trendelenburg position and contrast flowed into the Thoracic
spine region.
FINDINGS: THORACIC AND LUMBAR MYELOGRAM FINDINGS:

Diffuse sclerotic metastatic disease is noted throughout the
thoracic spine as noted on prior MRI studies. Recent T8 through T10
laminectomy. On the lateral view, there is posterior epidural soft
tissue indenting the dura and narrowing the spinal canal
significantly. This is touching the cord and there may be cord
compression. See separate CT myelogram report. No other areas of
spinal stenosis are identified. No intradural mass.

In the lumbar spine, there is bony metastatic disease. There is a
complete block at L4-5. Based on the MRI this is related to disc and
facet degeneration.

CT THORACIC MYELOGRAM FINDINGS:

Sclerotic metastatic disease is present from T6 through T11. Tumor
extends into the posterior elements and ribs at multiple levels. No
pathologic fracture. Recent Total laminectomy at T8, T9, and T10 for
decompression related to metastatic disease. As noted on the
myelogram, the dura is displaced anteriorly due to a posterior soft
tissue process extending from T8 through T10. There is compression
of the cord. Overall this process has progressed in extent and
severity since the preoperative MRI of 01/28/2014. Given the recent
laminectomy, this is not likely to represent recurrent tumor but
more likely postoperative blood. Infection considered a less likely
possibility.

Moderately large right-sided disc protrusion at T1 -2. Slightly
smaller right-sided disc protrusion at T2-3. Small central disc
protrusion at T3-4.

Sclerotic bony metastatic disease is seen in the upper thoracic
spine at several levels. No pathologic fracture. No cord compression
in the upper thoracic spine.

CT LUMBAR MYELOGRAM FINDINGS:

Sclerotic metastatic disease is present in the T2 vertebral body. No
epidural tumor this level. No pathologic fracture in the lumbar
spine. Bilateral renal small calculi without hydronephrosis.

L1-2:  Disc and facet degeneration without significant stenosis

L2-3: Diffuse disc bulging and facet hypertrophy with mild to
moderate spinal stenosis

L3-4: Diffuse disc bulging and facet hypertrophy with moderate to
severe spinal stenosis

L4-5: Severe spinal stenosis related to extensive disc bulging and
facet hypertrophy.

L5-S1: Advanced disc degeneration and spondylosis with foraminal
encroachment bilaterally secondary to spurring.
IMPRESSION: Widespread bony metastatic disease throughout the thoracic and
lumbar spine without pathologic fracture.

Recent decompression with total laminectomy at T8, T9, and T10.
There is soft tissue filling the surgical bed with displacement of
the posterior dura causing cord compression. This process has
progressed in extent and severity since the preoperative MRI and
presumably represents postop hematoma. Infection and tumor
considered less likely.

Lumbar spondylosis with multilevel spinal stenosis. Moderate to
severe stenosis at L3-4 and severe spinal stenosis with near
complete block of contrast at L4-5.
